# Patient Record
Sex: Male | Born: 1950 | Race: White | Hispanic: No | Marital: Married | Smoking: Never smoker
Health system: Southern US, Community
[De-identification: ages and names within clinical notes are randomized; demographics above are authoritative.]

## PROBLEM LIST (undated history)

## (undated) DIAGNOSIS — Z9889 Other specified postprocedural states: Secondary | ICD-10-CM

## (undated) DIAGNOSIS — K209 Esophagitis, unspecified without bleeding: Secondary | ICD-10-CM

## (undated) DIAGNOSIS — R112 Nausea with vomiting, unspecified: Secondary | ICD-10-CM

## (undated) DIAGNOSIS — R7303 Prediabetes: Secondary | ICD-10-CM

## (undated) DIAGNOSIS — C61 Malignant neoplasm of prostate: Secondary | ICD-10-CM

## (undated) DIAGNOSIS — M549 Dorsalgia, unspecified: Secondary | ICD-10-CM

## (undated) DIAGNOSIS — M4640 Discitis, unspecified, site unspecified: Secondary | ICD-10-CM

## (undated) DIAGNOSIS — T8859XA Other complications of anesthesia, initial encounter: Secondary | ICD-10-CM

## (undated) DIAGNOSIS — I10 Essential (primary) hypertension: Secondary | ICD-10-CM

## (undated) DIAGNOSIS — G4730 Sleep apnea, unspecified: Secondary | ICD-10-CM

## (undated) DIAGNOSIS — G8929 Other chronic pain: Secondary | ICD-10-CM

## (undated) DIAGNOSIS — M4620 Osteomyelitis of vertebra, site unspecified: Secondary | ICD-10-CM

## (undated) DIAGNOSIS — K2980 Duodenitis without bleeding: Secondary | ICD-10-CM

## (undated) DIAGNOSIS — T4145XA Adverse effect of unspecified anesthetic, initial encounter: Secondary | ICD-10-CM

## (undated) DIAGNOSIS — E785 Hyperlipidemia, unspecified: Secondary | ICD-10-CM

## (undated) HISTORY — DX: Esophagitis, unspecified without bleeding: K20.90

## (undated) HISTORY — PX: PROSTATE BIOPSY: SHX241

## (undated) HISTORY — DX: Duodenitis without bleeding: K29.80

## (undated) HISTORY — DX: Sleep apnea, unspecified: G47.30

## (undated) HISTORY — PX: VASECTOMY: SHX75

## (undated) HISTORY — DX: Hyperlipidemia, unspecified: E78.5

## (undated) HISTORY — DX: Essential (primary) hypertension: I10

## (undated) HISTORY — PX: BACK SURGERY: SHX140

## (undated) HISTORY — DX: Prediabetes: R73.03

## (undated) HISTORY — PX: OTHER SURGICAL HISTORY: SHX169

## (undated) HISTORY — PX: CARDIAC CATHETERIZATION: SHX172

---

## 2005-11-29 ENCOUNTER — Ambulatory Visit: Payer: Self-pay | Admitting: Pulmonary Disease

## 2006-02-14 ENCOUNTER — Encounter: Admission: RE | Admit: 2006-02-14 | Discharge: 2006-02-14 | Payer: Self-pay | Admitting: Family Medicine

## 2006-05-30 ENCOUNTER — Ambulatory Visit: Payer: Self-pay | Admitting: Pulmonary Disease

## 2006-09-18 ENCOUNTER — Encounter: Admission: RE | Admit: 2006-09-18 | Discharge: 2006-09-18 | Payer: Self-pay | Admitting: Family Medicine

## 2010-05-18 NOTE — Assessment & Plan Note (Signed)
[facility]                             PULMONARY OFFICE NOTE   NAME:Brandt, John DELUCIA                  MRN:          [id number]  DATE:05/30/2006                            DOB:          11-15-1950    I saw John Brandt in followup today for his severe obstructive sleep  apnea.   Again, he is currently on CPAP at 12 cm of water.  He says that he is  using his machine on a nightly basis.  He will go to bed at around 10:00-  11:00 at night.  He has no trouble falling asleep and he will sleep  through the night until about 4:00 in the morning and then wake up.  He  is not sure exactly why he wakes up but has a great deal of difficulty  falling back to sleep again.  He gets out of bed at 6:30.  He says as a  result he still feels quite tired during the day and has actually found  himself needing to take naps at times during the day.  He has not had  any other significant changes in his health or his medication since his  last visit.   ON PHYSICAL EXAM:  VITAL SIGNS:  Weight 288 pounds.  Temperature 98.2.  Blood pressure 122/82.  Heart rate 50.  Oxygen saturation 95% on room  air.  HEENT:  There is no sinus tenderness.  No oral lesions.  NECK:  No lymphadenopathy.  HEART:  Normal S1, S2.  CHEST:  There is no wheezing.  ABDOMEN:  Obese, soft, nontender.  EXTREMITIES:  No edema.   IMPRESSION:  Severe obstructive sleep apnea.  I will have him undergo an  auto CPAP titration study for 2 weeks to determine if he would need to  have any adjustment in his pressure setting.  The concern that I have is  that he may actually be having more significant apneic events during his  REM sleep which would correlate with the timing of his sleep disruption.  Additionally I have also re-emphasized to him the importance of diet,  exercise, and weight reduction.  I will follow up with him in  approximately 3 months but I will call him with the results of his  titration  study.     Coralyn Helling, MD  Electronically Signed    VS/MedQ  DD: 05/30/2006  DT: 05/30/2006  Job #: [id number]   cc:   Duncan Dull, M.D.

## 2010-05-21 NOTE — Assessment & Plan Note (Signed)
[facility]                             PULMONARY OFFICE NOTE   NAME:Cloke, ORVA GWALTNEY                  MRN:          [id number]  DATE:12/01/2005                            DOB:          03/08/1950    I had received a copy of Mr. Bentz's overnight polysomnogram.   This was done on July 02, 2002, at [facility] in  [place], [place], and was read by Dr. Clotilde Dieter, who is a  diplomate of the American Board of Sleep Medicine.   Upon review of this it showed that he had severe obstructive sleep apnea  with an apnea/hypopnea index of 109 and an oxygen saturation nadir of  73%.  He was then titrated to a CPAP pressure setting of 12 cmH2O with a  reduction in his apnea/hypopnea index to 2.9.     Coralyn Helling, MD  Electronically Signed    VS/MedQ  DD: 11/30/2005  DT: 12/01/2005  Job #: [id number]

## 2010-05-21 NOTE — Assessment & Plan Note (Signed)
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[employer].A  He had undergone an  overnight polysomnogram approximately 2-3 years ago in [place] which  from his description sounds like it was a split-night study and he was  started on CPAP after that.A  He believes he is on CPAP at 12,  unfortunately I do not have the copy of his sleep study for my review at  this time.A  He says he has had no difficulty using his CPAP machine.A  He is currently using a nasal mask and he does not use a heated  humidifier.A  He says that since using his CPAP machine, he has noticed  his sleep has gotten much better and his energy level has improved  considerably.A  His current sleep pattern is that he will go to bed at  around 11 o'clock at night.A  He falls asleep reasonably quickly and he  will sleep through until about 3 or 4 o'clock in the morning, at which  time he wakes up.A  He is not exactly sure why he wakes up, but then he  is able to fall back asleep usually without difficulty.A  This  apparently has only been a problem since he has moved to [place].A  He will wake up at 7 in the morning feeling fairly well rested and  refreshed.A  He says that as the day goes on, however, he will start to  feel droopy at around 4 o'clock in the afternoon, but he has had this  for the last 2-3 years.A  But again this is significantly better than  how he used to be before he was started on CPAP therapy.A  He no longer  snores while he is using his CPAP machine and his wife  says he seems  like he sleeps much more deeply now.A  He will occasionally use a  Tylenol PM if he goes out of town to help him with his sleep and he will  occasionally drink an ice tea during the day.A  Otherwise he has no  history of sleep walking or sleep talking.A  He says he has more vivid  dreams now, but denies any nightmares.A  There is no history of  bruxism.A  He denies any history of restless leg syndrome and there is  no history to suggest sleep hallucination, sleep paralysis, or  cataplexy.   PAST MEDICAL HISTORY:  Otherwise is significant for hypertension,  elevated cholesterol, allergies, and sleep apnea.A  He had also  undergone a cardiac catheterization several years ago which apparently  was unremarkable.A   CURRENT MEDICATIONS:  1.A  Fluticasone nasal spray. 2.A  Klor-Con 20 mEq  t.i.d. 3.A  Lipitor 10 mg daily. 4.A  Metoprolol 100 mg daily. 5.A  Benicar hydrochlorothiazide 40/25 one daily. 6.A  Amlodipine 10 mg  daily. 7.A  Men's multivitamin.   ALLERGIES:  PENICILLIN which causes him to get sweaty and IODINE which  he says causes heaving and shortness of breath.   SOCIAL HISTORY:  He is married.A  He has two children.A  There is no  history of tobacco use.A  He has only occasional alcohol use.A  Again,  he is working as a Hotel manager for [employer].   FAMILY HISTORY:  Significant for his father who had heart disease and  lung cancer.A  His mother had breast cancer.   REVIEW OF SYSTEMS:  Essentially negative except for as stated above.A  He used to be under the care of Dr. Bevely Palmer at [facility]  in [place], [place].A  He does complain of leg swelling in his  left leg greater than his right which improves when he has his legs  elevated.   PHYSICAL EXAMINATION:  VITAL SIGNS:A  He is 6 feet 1 inch tall, 284  pounds, temperature 98.3, blood pressure 118/88, heart rate 57, oxygen  saturation 96% on room air. HEENT:A  Pupils are reactive.A   There is no  sinus tenderness and no nasal discharge.A  He has a Mallampati IV airway  with an elongated uvula and an enlarged tongue.A  There is no  lymphadenopathy and no thyromegaly. HEART:A  S1 and S2 regular rate and  rhythm. CHEST:A  Clear to auscultation.A  ABDOMEN:A  Obese, soft, and  nontender. EXTREMITIES:A  He has minimal ankle edema. NEUROLOGY:A  There  is no focal deficits appreciated.   IMPRESSION:  History of obstructive sleep apnea, currently on CPAP  therapy.A  I have asked him to forward a copy of his sleep study results  for my review and in the meantime, we will make arrangements for him to  be established with the home care company in the [place] area.A  I  have encouraged him to maintain his compliance with the CPAP therapy.A  I have also discussed with him the importance of diet, exercise, and  weight reduction as the ultimate goal for treating his multiple health  problems.A   FOLLOWUP:  In 6 months.A     Coralyn Helling, MD  Electronically Signed    VS/MedQ  DD: 11/29/2005  DT: 11/30/2005  Job #: [id number]   cc:   Duncan Dull, M.D.

## 2010-05-21 NOTE — Assessment & Plan Note (Signed)
[facility]                             PULMONARY OFFICE NOTE   NAME:John Brandt, John Brandt                  MRN:          [id number]  DATE:07/11/2006                            DOB:          1950/03/01    I reviewed the CPAP download for Mr. Posadas from [facility],  and this shows that his overall hypopnea index is 1.9 with a 95th  percentile pressure setting of 11 with a maximal pressure setting of 12.  Interestingly though, is that he appears to be going to bed at about 9  o'clock at night, and waking up at 4 in the morning, which would give  him about 7 hours of sleep a night, which I suspect the reason why he is  waking up at 4 because he actually getting an adequate number of hours  of sleep.  What I have advised for him to do is to delay his sleep time  for 2 to 3 hours.  That way he can wake up at a more reasonable time,  but I do not think anything additional needs to be done with his CPAP,  as it appears he is doing quite well on his current settings.     Coralyn Helling, MD  Electronically Signed    VS/MedQ  DD: 07/11/2006  DT: 07/11/2006  Job #: [id number]   cc:   Duncan Dull, M.D.

## 2011-03-08 ENCOUNTER — Encounter: Payer: Self-pay | Admitting: Pulmonary Disease

## 2011-03-08 ENCOUNTER — Ambulatory Visit (INDEPENDENT_AMBULATORY_CARE_PROVIDER_SITE_OTHER): Payer: BC Managed Care – PPO | Admitting: Pulmonary Disease

## 2011-03-08 DIAGNOSIS — G4700 Insomnia, unspecified: Secondary | ICD-10-CM | POA: Insufficient documentation

## 2011-03-08 DIAGNOSIS — G4733 Obstructive sleep apnea (adult) (pediatric): Secondary | ICD-10-CM | POA: Insufficient documentation

## 2011-03-08 NOTE — Progress Notes (Signed)
Chief Complaint  Patient presents with  . Sleep consult    Last seen in 2008.Marland KitchenMarland Kitchenpt here for follow-up on CPAP use...epworth score is 3.    History of Present Illness: John Brandt is a 61 y.o. male with sleep apnea and insomnia.  I last saw him in 2008.    He has been doing okay with his CPAP.  He needs a new machine, and was advised to have sleep follow up.  He goes to bed at 11 pm.  He can fall asleep quickly, but sometimes has to use ambien.  He has a lot of stress from work.  He wakes up around 3 am, and then has trouble falling back to sleep.  He gets out of bed at 7 am.  He does okay during the day, but thinks he should be getting more sleep than he is.  He has gained about 10 lbs over the past year.  He has not been told he snores when using his CPAP.  He has a nasal mask, and this fits well.  He has been getting his supplies through a mail order, but wants to get set up with a local DME.  Past Medical History  Diagnosis Date  . Hypertension   . Hyperlipidemia   . Sleep apnea   . Allergic rhinitis     Past Surgical History  Procedure Date  . Cardiac catheterization     Allergies  Allergen Reactions  . Iodine   . Penicillins     Physical Exam:  Blood pressure 130/86, pulse 55, temperature 98 F (36.7 C), temperature source Oral, height 6\' 1"  (1.854 m), weight 312 lb 6.4 oz (141.704 kg), SpO2 93.00%. Body mass index is 41.22 kg/(m^2). Wt Readings from Last 2 Encounters:  03/08/11 312 lb 6.4 oz (141.704 kg)    General - Obese  HEENT - no sinus tenderness, no oral exudate, MP 4, no LAN Cardiac - s1s2 regular, no murmur Chest - no wheeze, rales Abdomen - soft, nontender Extremities - no edema Skin - no rashes Neurologic - normal strength Psychiatric - normal mood, behavior   Assessment/Plan:  Outpatient Encounter Prescriptions as of 03/08/2011  Medication Sig Dispense Refill  . amLODipine (NORVASC) 10 MG tablet 1 by mouth daily      . aspirin 81 MG  tablet Take 81 mg by mouth daily.      Marland Kitchen atorvastatin (LIPITOR) 10 MG tablet 1 by mouth daily      . cloNIDine (CATAPRES) 0.2 MG tablet 1 by mouth three times daily      . KLOR-CON M20 20 MEQ tablet 2 tablets three times dailt      . metoprolol succinate (TOPROL-XL) 50 MG 24 hr tablet 1 by mouth daily      . Multiple Vitamin (MULTIVITAMIN) tablet Take 1 tablet by mouth daily.      Marland Kitchen spironolactone (ALDACTONE) 25 MG tablet 1 by mouth daily      . triamcinolone (NASACORT) 55 MCG/ACT nasal inhaler 2 sprays in each nostril daily      . Vitamin D, Ergocalciferol, (DRISDOL) 50000 UNITS CAPS 1 tablet weekly      . zolpidem (AMBIEN) 10 MG tablet 1 by mouth at bedtime as needed        Tiyah Zelenak Pager:  [phone] 03/08/2011, 4:39 PM

## 2011-03-08 NOTE — Patient Instructions (Signed)
Will arrange for new CPAP machine Will call with results of CPAP report Follow up in 3 to 4 months

## 2011-03-08 NOTE — Assessment & Plan Note (Signed)
Discussed stimulus control, sleep restriction, relaxation techniques.  Also discussed realistic expectations from his sleep.

## 2011-03-08 NOTE — Progress Notes (Deleted)
  Subjective:    Patient ID: John Brandt, male    DOB: Aug 19, 1950, 61 y.o.   MRN: [id number]  HPI    Review of Systems  Constitutional:       Gained approx 10 lbs  HENT: Negative.   Eyes: Negative.   Respiratory: Positive for shortness of breath.   Cardiovascular: Positive for leg swelling.  Gastrointestinal: Negative.   Genitourinary: Negative.   Musculoskeletal: Positive for arthralgias.  Skin: Negative.   Neurological: Negative.   Hematological: Negative.   Psychiatric/Behavioral: Negative.        Objective:   Physical Exam        Assessment & Plan:

## 2011-03-08 NOTE — Assessment & Plan Note (Signed)
Will need to assess CPAP setting.  Will try to arrange for auto CPAP with new machine.  Advised he may need repeat sleep study to arrange for this.

## 2011-04-13 ENCOUNTER — Telehealth: Payer: Self-pay | Admitting: Pulmonary Disease

## 2011-04-13 DIAGNOSIS — G4733 Obstructive sleep apnea (adult) (pediatric): Secondary | ICD-10-CM

## 2011-04-13 NOTE — Telephone Encounter (Signed)
Auto CPAP 03/16/11 to 03/29/11>>Used on 15 of 16 nights with average 8 hrs 58 min.  Average AHI 2 with median CPAP 8 cm H2O and 95th percentile CPAP 10 cm H2O.  Will have my nurse inform patient that CPAP report looked very good.  Will set CPAP at 10 cm H2O.

## 2011-04-13 NOTE — Telephone Encounter (Signed)
lmomtcb x1 

## 2011-04-14 NOTE — Telephone Encounter (Signed)
lmomtcb x 2  

## 2011-04-15 NOTE — Telephone Encounter (Signed)
lmomtcb x 3  

## 2011-04-15 NOTE — Telephone Encounter (Signed)
Returning call can be reached at [phone].John Brandt

## 2011-04-15 NOTE — Telephone Encounter (Signed)
I spoke with patient about results and he verbalized understanding and had no questions 

## 2011-06-14 ENCOUNTER — Telehealth: Payer: Self-pay | Admitting: Pulmonary Disease

## 2011-06-14 NOTE — Telephone Encounter (Signed)
Pt returned triage's call & can be reached at [phone] for the rest of the day.  John Brandt

## 2011-06-14 NOTE — Telephone Encounter (Signed)
lmomtcb x1 for pt 

## 2011-06-14 NOTE — Telephone Encounter (Signed)
I spoke with the pt and he has a resmed machine so I advised him to bring the data card.He states he does not know what htat is so I advised to bring the machine.Carron Curie, CMA

## 2011-06-24 ENCOUNTER — Ambulatory Visit
Admission: RE | Admit: 2011-06-24 | Discharge: 2011-06-24 | Disposition: A | Discharge status: Home / Self Care | Payer: BC Managed Care – PPO | Source: Ambulatory Visit | Attending: Family Medicine | Admitting: Family Medicine

## 2011-06-24 ENCOUNTER — Other Ambulatory Visit: Payer: Self-pay | Admitting: Family Medicine

## 2011-06-24 DIAGNOSIS — M549 Dorsalgia, unspecified: Secondary | ICD-10-CM

## 2011-06-27 ENCOUNTER — Ambulatory Visit
Admission: RE | Admit: 2011-06-27 | Discharge: 2011-06-27 | Disposition: A | Discharge status: Home / Self Care | Payer: BC Managed Care – PPO | Source: Ambulatory Visit | Attending: Family Medicine | Admitting: Family Medicine

## 2011-06-27 ENCOUNTER — Other Ambulatory Visit: Payer: Self-pay | Admitting: Family Medicine

## 2011-06-27 DIAGNOSIS — M549 Dorsalgia, unspecified: Secondary | ICD-10-CM

## 2011-06-27 MED ORDER — GADOBENATE DIMEGLUMINE 529 MG/ML IV SOLN
20.0000 mL | Freq: Once | INTRAVENOUS | Status: AC | PRN
  Administered 2011-06-27: 20 mL via INTRAVENOUS

## 2011-06-28 ENCOUNTER — Other Ambulatory Visit: Payer: Self-pay | Admitting: Family Medicine

## 2011-06-29 ENCOUNTER — Other Ambulatory Visit: Payer: Self-pay | Admitting: Family Medicine

## 2011-06-29 ENCOUNTER — Ambulatory Visit
Admission: RE | Admit: 2011-06-29 | Discharge: 2011-06-29 | Disposition: A | Discharge status: Home / Self Care | Payer: BC Managed Care – PPO | Source: Ambulatory Visit | Attending: Family Medicine | Admitting: Family Medicine

## 2011-06-29 ENCOUNTER — Other Ambulatory Visit: Payer: BC Managed Care – PPO

## 2011-06-29 DIAGNOSIS — R222 Localized swelling, mass and lump, trunk: Secondary | ICD-10-CM

## 2011-06-30 ENCOUNTER — Encounter (HOSPITAL_COMMUNITY): Payer: Self-pay | Admitting: Emergency Medicine

## 2011-06-30 ENCOUNTER — Inpatient Hospital Stay (HOSPITAL_COMMUNITY)
Admission: EM | Admit: 2011-06-30 | Discharge: 2011-07-02 | DRG: 813 | Disposition: A | Discharge status: Home / Self Care | Payer: BC Managed Care – PPO | Attending: Internal Medicine | Admitting: Internal Medicine

## 2011-06-30 ENCOUNTER — Emergency Department (HOSPITAL_COMMUNITY): Payer: BC Managed Care – PPO

## 2011-06-30 DIAGNOSIS — D72829 Elevated white blood cell count, unspecified: Secondary | ICD-10-CM

## 2011-06-30 DIAGNOSIS — E785 Hyperlipidemia, unspecified: Secondary | ICD-10-CM | POA: Diagnosis present

## 2011-06-30 DIAGNOSIS — M545 Low back pain: Secondary | ICD-10-CM

## 2011-06-30 DIAGNOSIS — Z79899 Other long term (current) drug therapy: Secondary | ICD-10-CM

## 2011-06-30 DIAGNOSIS — G4700 Insomnia, unspecified: Secondary | ICD-10-CM

## 2011-06-30 DIAGNOSIS — K7689 Other specified diseases of liver: Secondary | ICD-10-CM | POA: Diagnosis present

## 2011-06-30 DIAGNOSIS — R1110 Vomiting, unspecified: Secondary | ICD-10-CM

## 2011-06-30 DIAGNOSIS — K760 Fatty (change of) liver, not elsewhere classified: Secondary | ICD-10-CM

## 2011-06-30 DIAGNOSIS — R112 Nausea with vomiting, unspecified: Secondary | ICD-10-CM

## 2011-06-30 DIAGNOSIS — I10 Essential (primary) hypertension: Secondary | ICD-10-CM | POA: Diagnosis present

## 2011-06-30 DIAGNOSIS — E876 Hypokalemia: Secondary | ICD-10-CM

## 2011-06-30 DIAGNOSIS — G4733 Obstructive sleep apnea (adult) (pediatric): Secondary | ICD-10-CM

## 2011-06-30 DIAGNOSIS — A088 Other specified intestinal infections: Principal | ICD-10-CM | POA: Diagnosis present

## 2011-06-30 DIAGNOSIS — D7289 Other specified disorders of white blood cells: Secondary | ICD-10-CM

## 2011-06-30 HISTORY — DX: Other specified postprocedural states: Z98.890

## 2011-06-30 LAB — CBC WITH DIFFERENTIAL/PLATELET
HCT: 48.1 % (ref 39.0–52.0)
Hemoglobin: 17.1 g/dL — ABNORMAL HIGH (ref 13.0–17.0)
Lymphocytes Relative: 10 % — ABNORMAL LOW (ref 12–46)
Lymphs Abs: 1.7 10*3/uL (ref 0.7–4.0)
Monocytes Absolute: 1.5 10*3/uL — ABNORMAL HIGH (ref 0.1–1.0)
Monocytes Relative: 9 % (ref 3–12)
Neutrophils Relative %: 81 % — ABNORMAL HIGH (ref 43–77)
RDW: 13.5 % (ref 11.5–15.5)
WBC: 17.3 10*3/uL — ABNORMAL HIGH (ref 4.0–10.5)

## 2011-06-30 LAB — COMPREHENSIVE METABOLIC PANEL
Alkaline Phosphatase: 118 U/L — ABNORMAL HIGH (ref 39–117)
CO2: 20 mEq/L (ref 19–32)
Calcium: 10.2 mg/dL (ref 8.4–10.5)
Chloride: 100 mEq/L (ref 96–112)
GFR calc Af Amer: >90 mL/min (ref >90)
GFR calc non Af Amer: 88 mL/min — ABNORMAL LOW (ref >90)
Potassium: 2.9 mEq/L — ABNORMAL LOW (ref 3.5–5.1)
Sodium: 139 mEq/L (ref 135–145)

## 2011-06-30 LAB — URINALYSIS, ROUTINE W REFLEX MICROSCOPIC
Specific Gravity, Urine: 1.033 — ABNORMAL HIGH (ref 1.005–1.030)
Urobilinogen, UA: 1 mg/dL (ref 0.0–1.0)

## 2011-06-30 LAB — LACTIC ACID, PLASMA: Lactic Acid, Venous: 2.3 mmol/L — ABNORMAL HIGH (ref 0.5–2.2)

## 2011-06-30 LAB — MAGNESIUM: Magnesium: 2.2 mg/dL (ref 1.5–2.5)

## 2011-06-30 LAB — CARDIAC PANEL(CRET KIN+CKTOT+MB+TROPI)
CK, MB: 4.8 ng/mL — ABNORMAL HIGH (ref 0.3–4.0)
Troponin I: <0.3 ng/mL (ref <0.30)

## 2011-06-30 LAB — LIPASE, BLOOD: Lipase: 37 U/L (ref 11–59)

## 2011-06-30 LAB — URINE MICROSCOPIC-ADD ON

## 2011-06-30 MED ORDER — CLONIDINE HCL 0.2 MG PO TABS
0.2000 mg | ORAL_TABLET | Freq: Three times a day (TID) | ORAL | Status: DC
  Administered 2011-06-30 – 2011-07-02 (×6): 0.2 mg via ORAL
  Filled 2011-06-30 (×8): qty 1

## 2011-06-30 MED ORDER — METOPROLOL SUCCINATE ER 50 MG PO TB24
50.0000 mg | ORAL_TABLET | Freq: Every day | ORAL | Status: DC
  Administered 2011-06-30 – 2011-07-01 (×2): 50 mg via ORAL
  Filled 2011-06-30 (×3): qty 1

## 2011-06-30 MED ORDER — ONDANSETRON HCL 4 MG/2ML IJ SOLN
INTRAMUSCULAR | Status: AC
  Administered 2011-06-30: 4 mg via INTRAVENOUS
  Filled 2011-06-30: qty 2

## 2011-06-30 MED ORDER — FLUTICASONE PROPIONATE 50 MCG/ACT NA SUSP
1.0000 | Freq: Every day | NASAL | Status: DC
  Administered 2011-06-30 – 2011-07-02 (×3): 1 via NASAL
  Filled 2011-06-30: qty 16

## 2011-06-30 MED ORDER — SODIUM CHLORIDE 0.9 % IV SOLN: INTRAVENOUS | Status: DC

## 2011-06-30 MED ORDER — POTASSIUM CHLORIDE 10 MEQ/100ML IV SOLN
10.0000 meq | INTRAVENOUS | Status: AC
  Administered 2011-06-30 (×2): 10 meq via INTRAVENOUS
  Filled 2011-06-30 (×2): qty 100

## 2011-06-30 MED ORDER — ONDANSETRON HCL 4 MG/2ML IJ SOLN: 4.0000 mg | Freq: Once | INTRAMUSCULAR | Status: DC

## 2011-06-30 MED ORDER — ASPIRIN EC 81 MG PO TBEC
81.0000 mg | DELAYED_RELEASE_TABLET | Freq: Every day | ORAL | Status: DC
  Administered 2011-06-30 – 2011-07-02 (×3): 81 mg via ORAL
  Filled 2011-06-30 (×3): qty 1

## 2011-06-30 MED ORDER — SODIUM CHLORIDE 0.9 % IV BOLUS (SEPSIS)
1000.0000 mL | Freq: Once | INTRAVENOUS | Status: AC
  Administered 2011-06-30: 1000 mL via INTRAVENOUS

## 2011-06-30 MED ORDER — ONDANSETRON HCL 4 MG PO TABS: 4.0000 mg | ORAL_TABLET | Freq: Four times a day (QID) | ORAL | Status: DC | PRN

## 2011-06-30 MED ORDER — SPIRONOLACTONE 25 MG PO TABS
25.0000 mg | ORAL_TABLET | Freq: Every day | ORAL | Status: DC
  Administered 2011-06-30 – 2011-07-02 (×3): 25 mg via ORAL
  Filled 2011-06-30 (×3): qty 1

## 2011-06-30 MED ORDER — ADULT MULTIVITAMIN W/MINERALS CH
1.0000 | ORAL_TABLET | Freq: Every day | ORAL | Status: DC
  Administered 2011-06-30 – 2011-07-02 (×3): 1 via ORAL
  Filled 2011-06-30 (×3): qty 1

## 2011-06-30 MED ORDER — AMLODIPINE BESYLATE 10 MG PO TABS
10.0000 mg | ORAL_TABLET | Freq: Every day | ORAL | Status: DC
  Administered 2011-06-30 – 2011-07-02 (×3): 10 mg via ORAL
  Filled 2011-06-30 (×3): qty 1

## 2011-06-30 MED ORDER — SODIUM CHLORIDE 0.9 % IJ SOLN
3.0000 mL | Freq: Two times a day (BID) | INTRAMUSCULAR | Status: DC
  Administered 2011-07-01 – 2011-07-02 (×2): 3 mL via INTRAVENOUS

## 2011-06-30 MED ORDER — ENOXAPARIN SODIUM 60 MG/0.6ML ~~LOC~~ SOLN
60.0000 mg | SUBCUTANEOUS | Status: DC
  Administered 2011-06-30 – 2011-07-01 (×2): 60 mg via SUBCUTANEOUS
  Filled 2011-06-30 (×3): qty 0.6

## 2011-06-30 MED ORDER — POTASSIUM CHLORIDE CRYS ER 20 MEQ PO TBCR
40.0000 meq | EXTENDED_RELEASE_TABLET | Freq: Three times a day (TID) | ORAL | Status: DC
  Administered 2011-06-30: 40 meq via ORAL
  Filled 2011-06-30 (×5): qty 2

## 2011-06-30 MED ORDER — ATORVASTATIN CALCIUM 10 MG PO TABS
10.0000 mg | ORAL_TABLET | Freq: Every day | ORAL | Status: DC
  Administered 2011-07-01 – 2011-07-02 (×2): 10 mg via ORAL
  Filled 2011-06-30 (×3): qty 1

## 2011-06-30 MED ORDER — ONDANSETRON HCL 4 MG/2ML IJ SOLN
4.0000 mg | INTRAMUSCULAR | Status: DC | PRN
  Administered 2011-06-30 – 2011-07-01 (×3): 4 mg via INTRAVENOUS
  Filled 2011-06-30 (×3): qty 2

## 2011-06-30 MED ORDER — SODIUM CHLORIDE 0.9 % IV SOLN
INTRAVENOUS | Status: DC
  Administered 2011-06-30: 75 mL/h via INTRAVENOUS
  Administered 2011-07-01: 06:00:00 via INTRAVENOUS

## 2011-06-30 MED ORDER — ONDANSETRON HCL 4 MG/2ML IJ SOLN
4.0000 mg | Freq: Once | INTRAMUSCULAR | Status: AC
  Administered 2011-06-30 (×2): 4 mg via INTRAVENOUS
  Filled 2011-06-30: qty 2

## 2011-06-30 MED ORDER — ONDANSETRON HCL 4 MG/2ML IJ SOLN
4.0000 mg | Freq: Four times a day (QID) | INTRAMUSCULAR | Status: DC | PRN
  Administered 2011-06-30: 4 mg via INTRAVENOUS
  Filled 2011-06-30: qty 2

## 2011-06-30 MED ORDER — ONDANSETRON HCL 4 MG PO TABS: 4.0000 mg | ORAL_TABLET | ORAL | Status: DC | PRN

## 2011-06-30 MED ORDER — ENOXAPARIN SODIUM 40 MG/0.4ML ~~LOC~~ SOLN: 40.0000 mg | SUBCUTANEOUS | Status: DC

## 2011-06-30 NOTE — Progress Notes (Signed)
ANTICOAGULATION CONSULT NOTE - Lovenox Adjustment  Pharmacy Consult for Lovenox Adjustment Indication: DVT Prophylaxis  Allergies  Allergen Reactions  . Iodine Rash  . Penicillins Rash    Patient Measurements: Height: 6\' 1"  (185.4 cm) Weight: 281 lb 9.6 oz (127.733 kg) IBW/kg (Calculated) : 79.9    Vital Signs: Temp: 98.2 F (36.8 C) (06/27 1551) Temp src: Oral (06/27 1551) BP: 155/97 mmHg (06/27 1551) Pulse Rate: 100  (06/27 1507)  Labs:  Basename 06/30/11 1145  HGB 17.1*  HCT 48.1  PLT 369  APTT --  LABPROT --  INR --  HEPARINUNFRC --  CREATININE 0.94  CKTOTAL 351*  CKMB 4.8*  TROPONINI <0.30    Estimated Creatinine Clearance: 114.1 ml/min (by C-G formula based on Cr of 0.94).   Medical History: Past Medical History  Diagnosis Date  . Hypertension   . Hyperlipidemia   . Sleep apnea   . Allergic rhinitis   . Hx of cardiac cath     Medications:  Scheduled:    . amLODipine  10 mg Oral Daily  . aspirin EC  81 mg Oral Daily  . atorvastatin  10 mg Oral q1800  . cloNIDine  0.2 mg Oral TID  . enoxaparin  60 mg Subcutaneous Q24H  . fluticasone  1 spray Each Nare Daily  . metoprolol succinate  50 mg Oral Daily  . multivitamin with minerals  1 tablet Oral Daily  . ondansetron      . ondansetron  4 mg Intravenous Once  . potassium chloride  10 mEq Intravenous Q1 Hr x 4  . potassium chloride SA  40 mEq Oral TID  . sodium chloride  1,000 mL Intravenous Once  . sodium chloride  3 mL Intravenous Q12H  . spironolactone  25 mg Oral Daily  . DISCONTD: sodium chloride   Intravenous STAT  . DISCONTD: enoxaparin  40 mg Subcutaneous Q24H  . DISCONTD: ondansetron (ZOFRAN) IV  4 mg Intravenous Once    Assessment: 60 YOM, BMI 37. Lovenox 40mg  sq daily ordered, pharmacy may adjust. CBC reviewed, H/H and pltc wnl.  Goal of Therapy:  VTE prophylaxis   Plan:  Will adjust dose per protocol to 0.5mg /kg q24h given normal renal fx and BMI >30. Will sign off,  reconsult if necessary.  Gwen Her PharmD  [phone] 06/30/2011 4:16 PM

## 2011-06-30 NOTE — ED Provider Notes (Signed)
History     CSN: [id number]  Arrival date & time 06/30/11  1045   First MD Initiated Contact with Patient 06/30/11 1056      Chief Complaint  Patient presents with  . Shortness of Breath  . Emesis    (Consider location/radiation/quality/duration/timing/severity/associated sxs/prior treatment) HPI Comments: Pt has been having lower back pain for last month.  Recently had MRI of lumbar spine ordered by PMD at [facility].  Over last week, started having nausea, decreased appetitie.  Started vomiting 3 days ago.  Unable to keep anything down since then.  No abd pain.  Pain is aching across lower back.  No radiation down legs.  No numbness or weakness to legs.  No loss of bowel/bladder control.  +normal BM yesterday.  No diarrhea,  No fevers.  No blood in stool or black stools.  No urinary symtpoms.  No cough or chest congestion.  No chest pain or tightness. Started having SOB yesterday, mostly on exertion.  Patient is a 61 y.o. male presenting with vomiting. The history is provided by the patient.  Emesis  This is a new problem. The current episode started more than 2 days ago. The problem occurs 5 to 10 times per day. The problem has been gradually worsening. The emesis has an appearance of stomach contents (had some dark brown material yesterday, none today). There has been no fever. Pertinent negatives include no abdominal pain, no arthralgias, no chills, no cough, no diarrhea, no fever, no headaches and no URI.    Past Medical History  Diagnosis Date  . Hypertension   . Hyperlipidemia   . Sleep apnea   . Allergic rhinitis   . Hx of cardiac cath     Past Surgical History  Procedure Date  . Cardiac catheterization     Family History  Problem Relation Age of Onset  . Allergies Mother   . Heart disease Mother   . Lung cancer Mother     mesothelioma  . Aneurysm Brother     brain  . Heart disease Father   . Lung cancer Father     History  Substance Use Topics  .  Smoking status: Never Smoker   . Smokeless tobacco: Never Used  . Alcohol Use: Yes     rare use      Review of Systems  Constitutional: Positive for fatigue. Negative for fever, chills and diaphoresis.  HENT: Negative for congestion, rhinorrhea and sneezing.   Eyes: Negative.   Respiratory: Positive for shortness of breath. Negative for cough and chest tightness.   Cardiovascular: Negative for chest pain and leg swelling.  Gastrointestinal: Positive for nausea and vomiting. Negative for abdominal pain, diarrhea and blood in stool.  Genitourinary: Negative for frequency, hematuria, flank pain and difficulty urinating.  Musculoskeletal: Positive for back pain. Negative for arthralgias.  Skin: Negative for rash.  Neurological: Negative for dizziness, speech difficulty, weakness, numbness and headaches.    Allergies  Iodine and Penicillins  Home Medications   Current Outpatient Rx  Name Route Sig Dispense Refill  . AMLODIPINE BESYLATE 10 MG PO TABS  10 mg. 1 by mouth daily    . ASPIRIN 81 MG PO TABS Oral Take 81 mg by mouth daily.    . ATORVASTATIN CALCIUM 10 MG PO TABS  10 mg. 1 by mouth daily    . CLONIDINE HCL 0.2 MG PO TABS Oral Take 0.6 mg by mouth 3 (three) times daily. 1 by mouth three times daily    .  KLOR-CON M20 20 MEQ PO TBCR Oral Take 40 mEq by mouth 3 (three) times daily. 2 tablets three times dailt    . METOPROLOL SUCCINATE ER 50 MG PO TB24 Oral Take 50 mg by mouth daily. 1 by mouth daily    . ONE-DAILY MULTI VITAMINS PO TABS Oral Take 1 tablet by mouth daily.    Marland Kitchen SPIRONOLACTONE 25 MG PO TABS Oral Take 25 mg by mouth daily. 1 by mouth daily    . TRAMADOL HCL 50 MG PO TABS Oral Take 50 mg by mouth every 8 (eight) hours as needed. pain    . TRIAMCINOLONE ACETONIDE 55 MCG/ACT NA INHA Nasal Place 2 sprays into the nose daily. 2 sprays in each nostril daily    . VITAMIN D (ERGOCALCIFEROL) 50000 UNITS PO CAPS  50,000 Units. 1 tablet weekly      BP 180/100  Pulse 89   Temp 97.8 F (36.6 C) (Oral)  Resp 26  Ht 6\' 1"  (1.854 m)  Wt 298 lb (135.172 kg)  BMI 39.32 kg/m2  SpO2 100%  Physical Exam  Constitutional: He is oriented to person, place, and time. He appears well-developed and well-nourished.  HENT:  Head: Normocephalic and atraumatic.       Dry mucus membranes   Eyes: Pupils are equal, round, and reactive to light.  Neck: Normal range of motion. Neck supple.  Cardiovascular: Normal rate, regular rhythm and normal heart sounds.   Pulmonary/Chest: Effort normal and breath sounds normal. No respiratory distress. He has no wheezes. He has no rales. He exhibits no tenderness.  Abdominal: Soft. Bowel sounds are normal. There is no tenderness. There is no rebound and no guarding.  Musculoskeletal: Normal range of motion. He exhibits edema (at baseline per pt). He exhibits no tenderness.       Pt has mild TTP lower lumbar musculature toward sacrum  Lymphadenopathy:    He has no cervical adenopathy.  Neurological: He is alert and oriented to person, place, and time. He has normal strength. No sensory deficit. GCS eye subscore is 4. GCS verbal subscore is 5. GCS motor subscore is 6.  Skin: Skin is warm and dry. No rash noted.  Psychiatric: He has a normal mood and affect.    ED Course  Procedures (including critical care time)  Results for orders placed during the hospital encounter of 06/30/11  CBC WITH DIFFERENTIAL      Component Value Range   WBC 17.3 (*) 4.0 - 10.5 K/uL   RBC 5.89 (*) 4.22 - 5.81 MIL/uL   Hemoglobin 17.1 (*) 13.0 - 17.0 g/dL   HCT 69.6  29.5 - 28.4 %   MCV 81.7  78.0 - 100.0 fL   MCH 29.0  26.0 - 34.0 pg   MCHC 35.6  30.0 - 36.0 g/dL   RDW 13.2  44.0 - 10.2 %   Platelets 369  150 - 400 K/uL   Neutrophils Relative 81 (*) 43 - 77 %   Neutro Abs 14.1 (*) 1.7 - 7.7 K/uL   Lymphocytes Relative 10 (*) 12 - 46 %   Lymphs Abs 1.7  0.7 - 4.0 K/uL   Monocytes Relative 9  3 - 12 %   Monocytes Absolute 1.5 (*) 0.1 - 1.0 K/uL    Eosinophils Relative 0  0 - 5 %   Eosinophils Absolute 0.0  0.0 - 0.7 K/uL   Basophils Relative 0  0 - 1 %   Basophils Absolute 0.0  0.0 - 0.1 K/uL  COMPREHENSIVE METABOLIC PANEL      Component Value Range   Sodium 139  135 - 145 mEq/L   Potassium 2.9 (*) 3.5 - 5.1 mEq/L   Chloride 100  96 - 112 mEq/L   CO2 20  19 - 32 mEq/L   Glucose, Bld 115 (*) 70 - 99 mg/dL   BUN 23  6 - 23 mg/dL   Creatinine, Ser 0.98  0.50 - 1.35 mg/dL   Calcium 11.9  8.4 - 14.7 mg/dL   Total Protein 8.7 (*) 6.0 - 8.3 g/dL   Albumin 4.2  3.5 - 5.2 g/dL   AST 33  0 - 37 U/L   ALT 39  0 - 53 U/L   Alkaline Phosphatase 118 (*) 39 - 117 U/L   Total Bilirubin 1.1  0.3 - 1.2 mg/dL   GFR calc non Af Amer 88 (*) >90 mL/min   GFR calc Af Amer >90  >90 mL/min  URINALYSIS, ROUTINE W REFLEX MICROSCOPIC      Component Value Range   Color, Urine AMBER (*) YELLOW   APPearance CLOUDY (*) CLEAR   Specific Gravity, Urine 1.033 (*) 1.005 - 1.030   pH 6.0  5.0 - 8.0   Glucose, UA NEGATIVE  NEGATIVE mg/dL   Hgb urine dipstick TRACE (*) NEGATIVE   Bilirubin Urine SMALL (*) NEGATIVE   Ketones, ur >80 (*) NEGATIVE mg/dL   Protein, ur >829 (*) NEGATIVE mg/dL   Urobilinogen, UA 1.0  0.0 - 1.0 mg/dL   Nitrite NEGATIVE  NEGATIVE   Leukocytes, UA SMALL (*) NEGATIVE  LACTIC ACID, PLASMA      Component Value Range   Lactic Acid, Venous 2.3 (*) 0.5 - 2.2 mmol/L  LIPASE, BLOOD      Component Value Range   Lipase 37  11 - 59 U/L  CARDIAC PANEL(CRET KIN+CKTOT+MB+TROPI)      Component Value Range   Total CK 351 (*) 7 - 232 U/L   CK, MB 4.8 (*) 0.3 - 4.0 ng/mL   Troponin I <0.30  <0.30 ng/mL   Relative Index 1.4  0.0 - 2.5  URINE MICROSCOPIC-ADD ON      Component Value Range   Squamous Epithelial / LPF RARE  RARE   WBC, UA 7-10  <3 WBC/hpf   RBC / HPF 0-2  <3 RBC/hpf   Bacteria, UA RARE  RARE   Casts GRANULAR CAST (*) NEGATIVE   Urine-Other MUCOUS PRESENT     Ct Abdomen Pelvis Wo Contrast  06/30/2011  *RADIOLOGY REPORT*   Clinical Data: Back pain, nausea and vomiting.  CT ABDOMEN AND PELVIS WITHOUT CONTRAST  Technique:  Multidetector CT imaging of the abdomen and pelvis was performed following the standard protocol without intravenous contrast.  Comparison: CT of abdomen and pelvis 02/14/2006.  Findings:  Lung Bases: Small calcified granuloma in the lateral segment of the right middle lobe.  Subsegmental atelectasis versus scarring in the inferior segment of the lingula.  Abdomen/Pelvis:  Diffusely decreased attenuation throughout the hepatic parenchyma, compatible with hepatic steatosis.  Some focal fatty sparing is noted adjacent to the gallbladder fossa.  Multiple calcified gallstones are seen within the dependent portion of the gallbladder, without evidence to suggest acute cholecystitis at this time.  Diffuse fatty atrophy of the pancreas.  The appearance of the spleen and bilateral adrenal glands is unremarkable.  There are low-attenuation lesions in the kidneys bilaterally, largest of which is in the interpolar region of the left kidney measuring up to 8.7 cm in  diameter.  These are favored to represent cysts (and are similar to prior study 02/14/2006), but are incompletely characterized on today's noncontrast CT examination.  There are no abnormal calcifications within the collecting system of either kidney, along the course of either ureter, or within the lumen of the urinary bladder to suggest urinary tract calculi.  No signs of hydroureteronephrosis or perinephric stranding to suggest urinary tract obstruction at this time.  There are numerous colonic diverticula, without surrounding inflammatory changes to suggest acute diverticulitis at this time. No ascites or pneumoperitoneum and no pathologic distension of bowel.  No definite pathologic lymphadenopathy identified within the abdomen or pelvis.  Coarse calcifications are noted within the prostate gland, and the prostate gland appears enlarged.  Urinary bladder is  unremarkable.  Musculoskeletal: There are no aggressive appearing lytic or blastic lesions noted in the visualized portions of the skeleton.  IMPRESSION: 1.  No acute findings in the abdomen or pelvis to account for the patient's symptoms.  Specifically, no abnormal urinary tract calculi or findings of urinary tract obstruction. 2.  Cholelithiasis without evidence to suggest acute cholecystitis. 3.  Severe hepatic steatosis. 4.  Low attenuation lesions in the kidneys bilaterally, favored to represent cysts as they are similar to prior examination from 02/14/2006. 5.  Fatty atrophy of the pancreas. 6.  Colonic diverticulosis without evidence to suggest acute diverticulitis. 7.  Prostatomegaly.  Original Report Authenticated By: Florencia Reasons, M.D.   Dg Chest 2 View  06/30/2011  *RADIOLOGY REPORT*  Clinical Data: Shortness of breath, vomiting.  CHEST - 2 VIEW  Comparison: 09/18/2006  Findings: Calcified granuloma in the right midlung.  Mild cardiomegaly.  No acute airspace opacities or effusions.  No acute bony abnormality.  IMPRESSION: Mild cardiomegaly.  Old granulomatous disease.  No active disease.  Original Report Authenticated By: Cyndie Chime, M.D.   Dg Lumbar Spine Complete  06/24/2011  *RADIOLOGY REPORT*  Clinical Data: Left-sided back pain for several months, no injury  LUMBAR SPINE - COMPLETE 4+ VIEW  Comparison: CT abdomen pelvis of 02/14/2006  Findings: There is very mild curvature of the lumbar spine convex to the right.  The lumbar vertebrae otherwise are in normal alignment.  Intervertebral disc spaces appear normal.  No compression deformity is seen.  IMPRESSION: Mild curvature convex to the right.  Normal intervertebral disc spaces.  No acute abnormality.  Original Report Authenticated By: Juline Patch, M.D.   Mr Lumbar Spine W Wo Contrast  06/28/2011  *RADIOLOGY REPORT*  Clinical Data: Back pain.  Palpable mass measuring 8 cm in the lumbar paraspinal area.  MRI LUMBAR SPINE WITHOUT  AND WITH CONTRAST  Technique:  Multiplanar and multiecho pulse sequences of the lumbar spine were obtained without and with intravenous contrast.  Contrast: 20mL MULTIHANCE GADOBENATE DIMEGLUMINE 529 MG/ML IV SOLN  BUN and creatinine were obtained on site at [facility] at [address] Results:  BUN not available mg/dL,  Creatinine 0.8 mg/dL. GFR 98  Comparison: 06/24/2011 radiographs.02/14/2006 abdominal CT.  Findings: On the left side of the T11-T12 disc space, there are inflammatory changes.  There appears to be marrow edema in the T12 vertebra, with post-gadolinium enhancement.  There is paravertebral phlegmon on the left which enhances.  This is only partially visualized due to the lumbar field of view.  The origin of this is unclear and the abnormality is incompletely visualized.  This is not have the typical appearance for spinal infection.  MRI thoracic spine with and without contrast is  recommended for further assessment.  The area of palpable abnormality was marked with a vitamin E capsules, which are identified on additional coronal images.  There is no mass lesion identified.  The paraspinal soft tissues demonstrate too numerous to count bilateral simple renal cysts without enhancement.  The largest is identified on coronal imaging, measuring 83 mm long axis.  The numbering convention used for this exam terms L5-S1 as the last full intervertebral disc space above the sacrum.  Spinal cord terminates posterior to the L1 vertebra.  There is a mild S-shaped curvature of the lumbar spine.  Dextroconvex apex at L4. Levoconvex apex at the thoracolumbar junction.  L1-L2:  Mild disc degeneration.  No stenosis.  L2-L3:  Negative.  L3-L4:  Negative.  L4-L5:  Disc desiccation with mild loss of height.  Small central disc protrusion is present with mild impression on the thecal sac. Mild narrowing of the right lateral recess without neural compression.  Central canal appears adequately patent.   Foramina patent.  Right facet mild right facet arthritis with small facet effusion.  L5-S1:  Disc desiccation.  Vacuum disc.  Small central disc protrusion minimally indenting the anterior thecal sac.  Lateral recesses and foramina appear patent.  IMPRESSION: 1.  Inflammatory changes or mass on the left side of the T12 vertebra extending along the T11-T12 paraspinal soft tissues.  This is incompletely visualized due to lumbar field of view.  Thoracic spine MRI with and without infusion recommended for further assessment. 2.  Mild lumbar spondylosis and S-shaped scoliosis.  No compressive stenosis. 3.  No superficial mass lesion is identified in the left flank/back soft tissues.  These results will be called to the ordering clinician or representative by the Radiologist Assistant, and communication documented in the PACS Dashboard.  Original Report Authenticated By: Andreas Newport, M.D.         Date: 06/30/2011  Rate: 107  Rhythm: sinus tachycardia, probable sinus rhythm  QRS Axis: left  Intervals: normal  ST/T Wave abnormalities: nonspecific ST/T changes  Conduction Disutrbances:none  Narrative Interpretation:   Old EKG Reviewed: none available    1. Vomiting   2. Hypokalemia       MDM  Pt with vomiting, dehydration, hypokalemia.  Ct does not show anything acute.  Consulted [facility] who will admit pt.  He has been given fluids, potassioum replacement        Rolan Bucco, MD 06/30/11 1340

## 2011-06-30 NOTE — ED Notes (Signed)
Per pt, started vomiting on and off 2 days ago, unable to keep food down-short of breath since yesterday

## 2011-06-30 NOTE — ED Notes (Signed)
Transported to radiology, patient reports that his nausea is resolved following the zofran

## 2011-06-30 NOTE — H&P (Signed)
Patient's PCP: GATES,DONNA RUTH, MD  Chief Complaint: N/V  History of Present Illness: John Brandt is a 60 y.o. white male who over the  last week, started having nausea, decreased appetitie. Started vomiting 3 days ago. Unable to keep anything down since then. No abd pain. Pain is aching across lower back- this a chronic issue. No radiation down legs. No numbness or weakness to legs. No loss of bowel/bladder control. +normal BM yesterday. No diarrhea, No fevers. No blood in stool or black stools. No urinary symtpoms. No cough or chest congestion. No chest pain or tightness. Started having SOB yesterday- related to anxiety.  MRI done few days ago for back pain showed a ? Mass next to T12 and recommended MRI- will defer to PCP to get done when feeling better. Unless WBCs do not correct and then will do as inpatient to r/o infection    Meds: Scheduled Meds:   . sodium chloride   Intravenous STAT  . ondansetron      . ondansetron  4 mg Intravenous Once  . ondansetron (ZOFRAN) IV  4 mg Intravenous Once  . potassium chloride  10 mEq Intravenous Q1 Hr x 4  . sodium chloride  1,000 mL Intravenous Once   Continuous Infusions:  PRN Meds:. Allergies: Iodine and Penicillins Past Medical History  Diagnosis Date  . Hypertension   . Hyperlipidemia   . Sleep apnea   . Allergic rhinitis   . Hx of cardiac cath    Past Surgical History  Procedure Date  . Cardiac catheterization    Family History  Problem Relation Age of Onset  . Allergies Mother   . Heart disease Mother   . Lung cancer Mother     mesothelioma  . Aneurysm Brother     brain  . Heart disease Father   . Lung cancer Father    History   Social History  . Marital Status: Married    Spouse Name: N/A    Number of Children: N/A  . 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 Smoking status: Never Smoker   . Smokeless tobacco: Never Used  . Alcohol Use: Yes       rare use  . Drug Use: No  . Sexually Active: Not on file   Other Topics Concern  . Not on file   Social History Narrative  . No narrative on file   Review of Systems: All systems reviewed with the patient and positive as per history of present illness, otherwise all other systems are negative.   Physical Exam: Blood pressure 180/100, pulse 89, temperature 97.8 F (36.6 C), temperature source Oral, resp. rate 26, height 6' 1" (1.854 m), weight 135.172 kg (298 lb), SpO2 100.00%. General: Awake, Oriented x3, No acute distress, obese HEENT: EOMI, dry mucous membranes Neck: Supple CV: S1 and S2, rrr Lungs: Clear to ascultation bilaterally, no wheezing Abdomen: Soft, Nontender, Nondistended, +bowel sounds. Ext: Good pulses. Trace edema. No clubbing or cyanosis noted. Neuro: Cranial Nerves II-XII grossly intact. Has 5/5 motor strength in upper and lower extremities.    Lab results:  Basename 06/30/11 1145  NA 139  K 2.9*  CL 100  CO2 20  GLUCOSE 115*  BUN 23  CREATININE 0.94  CALCIUM 10.2  MG --  PHOS --    Basename 06/30/11 1145  AST 33  ALT 39  ALKPHOS 118*  BILITOT 1.1  PROT 8.7*  ALBUMIN   4.2    Basename 06/30/11 1145  LIPASE 37  AMYLASE --    Basename 06/30/11 1145  WBC 17.3*  NEUTROABS 14.1*  HGB 17.1*  HCT 48.1  MCV 81.7  PLT 369    Basename 06/30/11 1145  CKTOTAL 351*  CKMB 4.8*  CKMBINDEX --  TROPONINI <0.30   No components found with this basename: POCBNP:3 No results found for this basename: DDIMER in the last 72 hours No results found for this basename: HGBA1C:2 in the last 72 hours No results found for this basename: CHOL:2,HDL:2,LDLCALC:2,TRIG:2,CHOLHDL:2,LDLDIRECT:2 in the last 72 hours No results found for this basename: TSH,T4TOTAL,FREET3,T3FREE,THYROIDAB in the last 72 hours No results found for this basename: VITAMINB12:2,FOLATE:2,FERRITIN:2,TIBC:2,IRON:2,RETICCTPCT:2 in the last 72 hours Imaging results:  Ct Abdomen  Pelvis Wo Contrast  06/30/2011  *RADIOLOGY REPORT*  Clinical Data: Back pain, nausea and vomiting.  CT ABDOMEN AND PELVIS WITHOUT CONTRAST  Technique:  Multidetector CT imaging of the abdomen and pelvis was performed following the standard protocol without intravenous contrast.  Comparison: CT of abdomen and pelvis 02/14/2006.  Findings:  Lung Bases: Small calcified granuloma in the lateral segment of the right middle lobe.  Subsegmental atelectasis versus scarring in the inferior segment of the lingula.  Abdomen/Pelvis:  Diffusely decreased attenuation throughout the hepatic parenchyma, compatible with hepatic steatosis.  Some focal fatty sparing is noted adjacent to the gallbladder fossa.  Multiple calcified gallstones are seen within the dependent portion of the gallbladder, without evidence to suggest acute cholecystitis at this time.  Diffuse fatty atrophy of the pancreas.  The appearance of the spleen and bilateral adrenal glands is unremarkable.  There are low-attenuation lesions in the kidneys bilaterally, largest of which is in the interpolar region of the left kidney measuring up to 8.7 cm in diameter.  These are favored to represent cysts (and are similar to prior study 02/14/2006), but are incompletely characterized on today's noncontrast CT examination.  There are no abnormal calcifications within the collecting system of either kidney, along the course of either ureter, or within the lumen of the urinary bladder to suggest urinary tract calculi.  No signs of hydroureteronephrosis or perinephric stranding to suggest urinary tract obstruction at this time.  There are numerous colonic diverticula, without surrounding inflammatory changes to suggest acute diverticulitis at this time. No ascites or pneumoperitoneum and no pathologic distension of bowel.  No definite pathologic lymphadenopathy identified within the abdomen or pelvis.  Coarse calcifications are noted within the prostate gland, and the  prostate gland appears enlarged.  Urinary bladder is unremarkable.  Musculoskeletal: There are no aggressive appearing lytic or blastic lesions noted in the visualized portions of the skeleton.  IMPRESSION: 1.  No acute findings in the abdomen or pelvis to account for the patient's symptoms.  Specifically, no abnormal urinary tract calculi or findings of urinary tract obstruction. 2.  Cholelithiasis without evidence to suggest acute cholecystitis. 3.  Severe hepatic steatosis. 4.  Low attenuation lesions in the kidneys bilaterally, favored to represent cysts as they are similar to prior examination from 02/14/2006. 5.  Fatty atrophy of the pancreas. 6.  Colonic diverticulosis without evidence to suggest acute diverticulitis. 7.  Prostatomegaly.  Original Report Authenticated By: DANIEL W. ENTRIKIN, M.D.   Dg Chest 2 View  06/30/2011  *RADIOLOGY REPORT*  Clinical Data: Shortness of breath, vomiting.  CHEST - 2 VIEW  Comparison: 09/18/2006  Findings: Calcified granuloma in the right midlung.  Mild cardiomegaly.  No acute airspace opacities or effusions.  No acute bony abnormality.    IMPRESSION: Mild cardiomegaly.  Old granulomatous disease.  No active disease.  Original Report Authenticated By: KEVIN G. DOVER, M.D.   Dg Lumbar Spine Complete  06/24/2011  *RADIOLOGY REPORT*  Clinical Data: Left-sided back pain for several months, no injury  LUMBAR SPINE - COMPLETE 4+ VIEW  Comparison: CT abdomen pelvis of 02/14/2006  Findings: There is very mild curvature of the lumbar spine convex to the right.  The lumbar vertebrae otherwise are in normal alignment.  Intervertebral disc spaces appear normal.  No compression deformity is seen.  IMPRESSION: Mild curvature convex to the right.  Normal intervertebral disc spaces.  No acute abnormality.  Original Report Authenticated By: PAUL D. BARRY, M.D.   Mr Lumbar Spine W Wo Contrast  06/28/2011  *RADIOLOGY REPORT*  Clinical Data: Back pain.  Palpable mass measuring 8 cm in  the lumbar paraspinal area.  MRI LUMBAR SPINE WITHOUT AND WITH CONTRAST  Technique:  Multiplanar and multiecho pulse sequences of the lumbar spine were obtained without and with intravenous contrast.  Contrast: 20mL MULTIHANCE GADOBENATE DIMEGLUMINE 529 MG/ML IV SOLN  BUN and creatinine were obtained on site at [facility] at [address] Results:  BUN not available mg/dL,  Creatinine 0.8 mg/dL. GFR 98  Comparison: 06/24/2011 radiographs.02/14/2006 abdominal CT.  Findings: On the left side of the T11-T12 disc space, there are inflammatory changes.  There appears to be marrow edema in the T12 vertebra, with post-gadolinium enhancement.  There is paravertebral phlegmon on the left which enhances.  This is only partially visualized due to the lumbar field of view.  The origin of this is unclear and the abnormality is incompletely visualized.  This is not have the typical appearance for spinal infection.  MRI thoracic spine with and without contrast is recommended for further assessment.  The area of palpable abnormality was marked with a vitamin E capsules, which are identified on additional coronal images.  There is no mass lesion identified.  The paraspinal soft tissues demonstrate too numerous to count bilateral simple renal cysts without enhancement.  The largest is identified on coronal imaging, measuring 83 mm long axis.  The numbering convention used for this exam terms L5-S1 as the last full intervertebral disc space above the sacrum.  Spinal cord terminates posterior to the L1 vertebra.  There is a mild S-shaped curvature of the lumbar spine.  Dextroconvex apex at L4. Levoconvex apex at the thoracolumbar junction.  L1-L2:  Mild disc degeneration.  No stenosis.  L2-L3:  Negative.  L3-L4:  Negative.  L4-L5:  Disc desiccation with mild loss of height.  Small central disc protrusion is present with mild impression on the thecal sac. Mild narrowing of the right lateral recess without neural  compression.  Central canal appears adequately patent.  Foramina patent.  Right facet mild right facet arthritis with small facet effusion.  L5-S1:  Disc desiccation.  Vacuum disc.  Small central disc protrusion minimally indenting the anterior thecal sac.  Lateral recesses and foramina appear patent.  IMPRESSION: 1.  Inflammatory changes or mass on the left side of the T12 vertebra extending along the T11-T12 paraspinal soft tissues.  This is incompletely visualized due to lumbar field of view.  Thoracic spine MRI with and without infusion recommended for further assessment. 2.  Mild lumbar spondylosis and S-shaped scoliosis.  No compressive stenosis. 3.  No superficial mass lesion is identified in the left flank/back soft tissues.  These results will be called to the ordering clinician or representative by the Radiologist   Assistant, and communication documented in the PACS Dashboard.  Original Report Authenticated By: GEOFFREY LAMKE, M.D.     Assessment & Plan by Problem:   *Nausea & vomiting- ? Viral in nature, CT scan not showing acute issues, symptomatic treatment, further work up if not resolving, IVF to help with dehydation   OSA (obstructive sleep apnea)- CPAP at night   Leukocytosis- ? Viral gastroenteritis   Hypokalemia- check mag and replace K+, recheck BMP in AM, place on tele until K+ WNL  Steatosis- encourage weight loss   Time spent on admission, talking to the patient, and coordinating care was: 40 mins.  Carmencita Cusic, DO 06/30/2011, 2:39 PM  

## 2011-06-30 NOTE — Progress Notes (Deleted)
Patient's PCP: Hollice Espy, MD  Chief Complaint: N/V  History of Present Illness: John Brandt is a 61 y.o. white male who over the  last week, started having nausea, decreased appetitie. Started vomiting 3 days ago. Unable to keep anything down since then. No abd pain. Pain is aching across lower back- this a chronic issue. No radiation down legs. No numbness or weakness to legs. No loss of bowel/bladder control. +normal BM yesterday. No diarrhea, No fevers. No blood in stool or black stools. No urinary symtpoms. No cough or chest congestion. No chest pain or tightness. Started having SOB yesterday- related to anxiety.  MRI done few days ago for back pain showed a ? Mass next to T12 and recommended MRI- will defer to PCP to get done when feeling better. Unless WBCs do not correct and then will do as inpatient to r/o infection    Meds: Scheduled Meds:   . sodium chloride   Intravenous STAT  . ondansetron      . ondansetron  4 mg Intravenous Once  . ondansetron (ZOFRAN) IV  4 mg Intravenous Once  . potassium chloride  10 mEq Intravenous Q1 Hr x 4  . sodium chloride  1,000 mL Intravenous Once   Continuous Infusions:  PRN Meds:. Allergies: Iodine and Penicillins Past Medical History  Diagnosis Date  . Hypertension   . Hyperlipidemia   . Sleep apnea   . Allergic rhinitis   . Hx of cardiac cath    Past Surgical History  Procedure Date  . Cardiac catheterization    Family History  Problem Relation Age of Onset  . Allergies Mother   . Heart disease Mother   . Lung cancer Mother     mesothelioma  . Aneurysm Brother     brain  . Heart disease Father   . Lung cancer Father    History   Social History  . Marital Status: Married    Spouse Name: N/A    Number of Children: N/A  . 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 Smoking status: Never Smoker   . Smokeless tobacco: Never Used  . Alcohol Use: Yes       rare use  . Drug Use: No  . Sexually Active: Not on file   Other Topics Concern  . Not on file   Social History Narrative  . No narrative on file   Review of Systems: All systems reviewed with the patient and positive as per history of present illness, otherwise all other systems are negative.   Physical Exam: Blood pressure 180/100, pulse 89, temperature 97.8 F (36.6 C), temperature source Oral, resp. rate 26, height 6\' 1"  (1.854 m), weight 135.172 kg (298 lb), SpO2 100.00%. General: Awake, Oriented x3, No acute distress, obese HEENT: EOMI, dry mucous membranes Neck: Supple CV: S1 and S2, rrr Lungs: Clear to ascultation bilaterally, no wheezing Abdomen: Soft, Nontender, Nondistended, +bowel sounds. Ext: Good pulses. Trace edema. No clubbing or cyanosis noted. Neuro: Cranial Nerves II-XII grossly intact. Has 5/5 motor strength in upper and lower extremities.    Lab results:  [facility] 06/30/11 1145  NA 139  K 2.9*  CL 100  CO2 20  GLUCOSE 115*  BUN 23  CREATININE 0.94  CALCIUM 10.2  MG --  PHOS --    Basename 06/30/11 1145  AST 33  ALT 39  ALKPHOS 118*  BILITOT 1.1  PROT 8.7*  ALBUMIN  4.2    Basename 06/30/11 1145  LIPASE 37  AMYLASE --    Basename 06/30/11 1145  WBC 17.3*  NEUTROABS 14.1*  HGB 17.1*  HCT 48.1  MCV 81.7  PLT 369    Basename 06/30/11 1145  CKTOTAL 351*  CKMB 4.8*  CKMBINDEX --  TROPONINI <0.30   No components found with this basename: POCBNP:3 No results found for this basename: DDIMER in the last 72 hours No results found for this basename: HGBA1C:2 in the last 72 hours No results found for this basename: CHOL:2,HDL:2,LDLCALC:2,TRIG:2,CHOLHDL:2,LDLDIRECT:2 in the last 72 hours No results found for this basename: TSH,T4TOTAL,FREET3,T3FREE,THYROIDAB in the last 72 hours No results found for this basename: VITAMINB12:2,FOLATE:2,FERRITIN:2,TIBC:2,IRON:2,RETICCTPCT:2 in the last 72 hours Imaging results:  Ct Abdomen  Pelvis Wo Contrast  06/30/2011  *RADIOLOGY REPORT*  Clinical Data: Back pain, nausea and vomiting.  CT ABDOMEN AND PELVIS WITHOUT CONTRAST  Technique:  Multidetector CT imaging of the abdomen and pelvis was performed following the standard protocol without intravenous contrast.  Comparison: CT of abdomen and pelvis 02/14/2006.  Findings:  Lung Bases: Small calcified granuloma in the lateral segment of the right middle lobe.  Subsegmental atelectasis versus scarring in the inferior segment of the lingula.  Abdomen/Pelvis:  Diffusely decreased attenuation throughout the hepatic parenchyma, compatible with hepatic steatosis.  Some focal fatty sparing is noted adjacent to the gallbladder fossa.  Multiple calcified gallstones are seen within the dependent portion of the gallbladder, without evidence to suggest acute cholecystitis at this time.  Diffuse fatty atrophy of the pancreas.  The appearance of the spleen and bilateral adrenal glands is unremarkable.  There are low-attenuation lesions in the kidneys bilaterally, largest of which is in the interpolar region of the left kidney measuring up to 8.7 cm in diameter.  These are favored to represent cysts (and are similar to prior study 02/14/2006), but are incompletely characterized on today's noncontrast CT examination.  There are no abnormal calcifications within the collecting system of either kidney, along the course of either ureter, or within the lumen of the urinary bladder to suggest urinary tract calculi.  No signs of hydroureteronephrosis or perinephric stranding to suggest urinary tract obstruction at this time.  There are numerous colonic diverticula, without surrounding inflammatory changes to suggest acute diverticulitis at this time. No ascites or pneumoperitoneum and no pathologic distension of bowel.  No definite pathologic lymphadenopathy identified within the abdomen or pelvis.  Coarse calcifications are noted within the prostate gland, and the  prostate gland appears enlarged.  Urinary bladder is unremarkable.  Musculoskeletal: There are no aggressive appearing lytic or blastic lesions noted in the visualized portions of the skeleton.  IMPRESSION: 1.  No acute findings in the abdomen or pelvis to account for the patient's symptoms.  Specifically, no abnormal urinary tract calculi or findings of urinary tract obstruction. 2.  Cholelithiasis without evidence to suggest acute cholecystitis. 3.  Severe hepatic steatosis. 4.  Low attenuation lesions in the kidneys bilaterally, favored to represent cysts as they are similar to prior examination from 02/14/2006. 5.  Fatty atrophy of the pancreas. 6.  Colonic diverticulosis without evidence to suggest acute diverticulitis. 7.  Prostatomegaly.  Original Report Authenticated By: Florencia Reasons, M.D.   Dg Chest 2 View  06/30/2011  *RADIOLOGY REPORT*  Clinical Data: Shortness of breath, vomiting.  CHEST - 2 VIEW  Comparison: 09/18/2006  Findings: Calcified granuloma in the right midlung.  Mild cardiomegaly.  No acute airspace opacities or effusions.  No acute bony abnormality.  IMPRESSION: Mild cardiomegaly.  Old granulomatous disease.  No active disease.  Original Report Authenticated By: Cyndie Chime, M.D.   Dg Lumbar Spine Complete  06/24/2011  *RADIOLOGY REPORT*  Clinical Data: Left-sided back pain for several months, no injury  LUMBAR SPINE - COMPLETE 4+ VIEW  Comparison: CT abdomen pelvis of 02/14/2006  Findings: There is very mild curvature of the lumbar spine convex to the right.  The lumbar vertebrae otherwise are in normal alignment.  Intervertebral disc spaces appear normal.  No compression deformity is seen.  IMPRESSION: Mild curvature convex to the right.  Normal intervertebral disc spaces.  No acute abnormality.  Original Report Authenticated By: Juline Patch, M.D.   Mr Lumbar Spine W Wo Contrast  06/28/2011  *RADIOLOGY REPORT*  Clinical Data: Back pain.  Palpable mass measuring 8 cm in  the lumbar paraspinal area.  MRI LUMBAR SPINE WITHOUT AND WITH CONTRAST  Technique:  Multiplanar and multiecho pulse sequences of the lumbar spine were obtained without and with intravenous contrast.  Contrast: 20mL MULTIHANCE GADOBENATE DIMEGLUMINE 529 MG/ML IV SOLN  BUN and creatinine were obtained on site at [facility] at [address] Results:  BUN not available mg/dL,  Creatinine 0.8 mg/dL. GFR 98  Comparison: 06/24/2011 radiographs.02/14/2006 abdominal CT.  Findings: On the left side of the T11-T12 disc space, there are inflammatory changes.  There appears to be marrow edema in the T12 vertebra, with post-gadolinium enhancement.  There is paravertebral phlegmon on the left which enhances.  This is only partially visualized due to the lumbar field of view.  The origin of this is unclear and the abnormality is incompletely visualized.  This is not have the typical appearance for spinal infection.  MRI thoracic spine with and without contrast is recommended for further assessment.  The area of palpable abnormality was marked with a vitamin E capsules, which are identified on additional coronal images.  There is no mass lesion identified.  The paraspinal soft tissues demonstrate too numerous to count bilateral simple renal cysts without enhancement.  The largest is identified on coronal imaging, measuring 83 mm long axis.  The numbering convention used for this exam terms L5-S1 as the last full intervertebral disc space above the sacrum.  Spinal cord terminates posterior to the L1 vertebra.  There is a mild S-shaped curvature of the lumbar spine.  Dextroconvex apex at L4. Levoconvex apex at the thoracolumbar junction.  L1-L2:  Mild disc degeneration.  No stenosis.  L2-L3:  Negative.  L3-L4:  Negative.  L4-L5:  Disc desiccation with mild loss of height.  Small central disc protrusion is present with mild impression on the thecal sac. Mild narrowing of the right lateral recess without neural  compression.  Central canal appears adequately patent.  Foramina patent.  Right facet mild right facet arthritis with small facet effusion.  L5-S1:  Disc desiccation.  Vacuum disc.  Small central disc protrusion minimally indenting the anterior thecal sac.  Lateral recesses and foramina appear patent.  IMPRESSION: 1.  Inflammatory changes or mass on the left side of the T12 vertebra extending along the T11-T12 paraspinal soft tissues.  This is incompletely visualized due to lumbar field of view.  Thoracic spine MRI with and without infusion recommended for further assessment. 2.  Mild lumbar spondylosis and S-shaped scoliosis.  No compressive stenosis. 3.  No superficial mass lesion is identified in the left flank/back soft tissues.  These results will be called to the ordering clinician or representative by the Radiologist  Assistant, and communication documented in the PACS Dashboard.  Original Report Authenticated By: Andreas Newport, M.D.     Assessment & Plan by Problem:   *Nausea & vomiting- ? Viral in nature, CT scan not showing acute issues, symptomatic treatment, further work up if not resolving, IVF to help with dehydation   OSA (obstructive sleep apnea)- CPAP at night   Leukocytosis- ? Viral gastroenteritis   Hypokalemia- check mag and replace K+, recheck BMP in AM, place on tele until K+ WNL  Steatosis- encourage weight loss   Time spent on admission, talking to the patient, and coordinating care was: 40 mins.  Shannyn Jankowiak, DO 06/30/2011, 2:39 PM

## 2011-06-30 NOTE — ED Notes (Signed)
Pt. Is unable to use the restroom at this time, but has a urinal at the bedside.

## 2011-07-01 ENCOUNTER — Other Ambulatory Visit: Payer: BC Managed Care – PPO

## 2011-07-01 ENCOUNTER — Inpatient Hospital Stay (HOSPITAL_COMMUNITY): Payer: BC Managed Care – PPO

## 2011-07-01 ENCOUNTER — Inpatient Hospital Stay: Admission: RE | Admit: 2011-07-01 | Payer: BC Managed Care – PPO | Source: Ambulatory Visit

## 2011-07-01 DIAGNOSIS — R112 Nausea with vomiting, unspecified: Secondary | ICD-10-CM

## 2011-07-01 DIAGNOSIS — E876 Hypokalemia: Secondary | ICD-10-CM

## 2011-07-01 DIAGNOSIS — D7289 Other specified disorders of white blood cells: Secondary | ICD-10-CM

## 2011-07-01 DIAGNOSIS — M545 Low back pain: Secondary | ICD-10-CM

## 2011-07-01 LAB — COMPREHENSIVE METABOLIC PANEL
ALT: 36 U/L (ref 0–53)
Alkaline Phosphatase: 94 U/L (ref 39–117)
CO2: 23 mEq/L (ref 19–32)
GFR calc Af Amer: >90 mL/min (ref >90)
GFR calc non Af Amer: 90 mL/min — ABNORMAL LOW (ref >90)
Glucose, Bld: 118 mg/dL — ABNORMAL HIGH (ref 70–99)
Potassium: 2.8 mEq/L — ABNORMAL LOW (ref 3.5–5.1)
Sodium: 142 mEq/L (ref 135–145)
Total Bilirubin: 0.8 mg/dL (ref 0.3–1.2)

## 2011-07-01 LAB — LIPASE, BLOOD: Lipase: 59 U/L (ref 11–59)

## 2011-07-01 LAB — CBC
MCHC: 33.5 g/dL (ref 30.0–36.0)
Platelets: 264 10*3/uL (ref 150–400)
RDW: 13.9 % (ref 11.5–15.5)

## 2011-07-01 LAB — CARDIAC PANEL(CRET KIN+CKTOT+MB+TROPI): Total CK: 264 U/L — ABNORMAL HIGH (ref 7–232)

## 2011-07-01 MED ORDER — PROMETHAZINE HCL 25 MG/ML IJ SOLN
12.5000 mg | Freq: Three times a day (TID) | INTRAMUSCULAR | Status: DC | PRN
  Administered 2011-07-01 – 2011-07-02 (×2): 12.5 mg via INTRAVENOUS
  Filled 2011-07-01 (×2): qty 1

## 2011-07-01 MED ORDER — ONDANSETRON HCL 4 MG/2ML IJ SOLN
4.0000 mg | Freq: Four times a day (QID) | INTRAMUSCULAR | Status: DC
  Administered 2011-07-01 – 2011-07-02 (×4): 4 mg via INTRAVENOUS
  Filled 2011-07-01 (×5): qty 2

## 2011-07-01 MED ORDER — POTASSIUM CHLORIDE 10 MEQ/100ML IV SOLN
10.0000 meq | INTRAVENOUS | Status: DC
  Administered 2011-07-01: 10 meq via INTRAVENOUS
  Filled 2011-07-01 (×4): qty 100

## 2011-07-01 MED ORDER — POTASSIUM CHLORIDE IN NACL 40-0.9 MEQ/L-% IV SOLN
INTRAVENOUS | Status: DC
  Administered 2011-07-01 – 2011-07-02 (×2): via INTRAVENOUS
  Filled 2011-07-01 (×3): qty 1000

## 2011-07-01 MED ORDER — HYDROCODONE-ACETAMINOPHEN 5-325 MG PO TABS
1.0000 | ORAL_TABLET | Freq: Four times a day (QID) | ORAL | Status: DC | PRN
  Administered 2011-07-01 – 2011-07-02 (×2): 1 via ORAL
  Filled 2011-07-01 (×2): qty 1

## 2011-07-01 MED ORDER — METOCLOPRAMIDE HCL 5 MG/ML IJ SOLN
5.0000 mg | Freq: Four times a day (QID) | INTRAMUSCULAR | Status: DC
  Administered 2011-07-01 – 2011-07-02 (×5): 5 mg via INTRAVENOUS
  Filled 2011-07-01 (×2): qty 1
  Filled 2011-07-01: qty 2
  Filled 2011-07-01 (×2): qty 1
  Filled 2011-07-01 (×2): qty 2

## 2011-07-01 NOTE — Care Management Note (Unsigned)
    Page 1 of 1   07/01/2011     4:42:54 PM   CARE MANAGEMENT NOTE 07/01/2011  Patient:  John Brandt, John Brandt   Account Number:  [id number]  Date Initiated:  07/01/2011  Documentation initiated by:  Lanier Clam  Subjective/Objective Assessment:   ADMITTED W/N/V     Action/Plan:   FROM HOME   Anticipated DC Date:  07/05/2011   Anticipated DC Plan:  HOME/SELF CARE      DC Planning Services  CM consult      Choice offered to / List presented to:             Status of service:  In process, will continue to follow Medicare Important Message given?   (If response is "NO", the following Medicare IM given date fields will be blank) Date Medicare IM given:   Date Additional Medicare IM given:    Discharge Disposition:    Per UR Regulation:  Reviewed for med. necessity/level of care/duration of stay  If discussed at Long Length of Stay Meetings, dates discussed:    Comments:  07/01/11 [facility] RN,BSN NCM [phone]

## 2011-07-01 NOTE — Progress Notes (Signed)
Patient seen and examined by me.  Agree with plan by Toya Smothers.  Had BM since being seen.  Will change PRN zofran to ATC.  Continue with KUB and reglan.  ?viral in nature- trend labs  Marlin Canary DO

## 2011-07-01 NOTE — Progress Notes (Signed)
Nutrition Note  Pt triggered on the nutrition risk report for unintentional weight loss.  Pt reports weight lost only with current GI illness.  BMI is 37 which meets criteria for obesity grade 2.  Tolerating clear liquid diet.  No nutrition intervention at this time.  Oran Rein, RD [phone]

## 2011-07-01 NOTE — Progress Notes (Signed)
[facility] PROGRESS NOTE  John Brandt ZOX:[id number] DOB: 15-Dec-1950 DOA: 06/30/2011 PCP: Hollice Espy, MD  Brief narrative: 61 y.o. white male admitted with persistent n/v for 3-4 days.  No abd pain. Has been undergoing OP work up for low back pain for several weeks and has developed constipation likely from meds. Lower back- this a chronic issue. No radiation down legs. No numbness or weakness to legs. No loss of bowel/bladder control. Last BM 06/30/11 was small and hard. No diarrhea, No fevers. No blood in stool or Yaris Ferrell stools. No urinary symtpoms. No cough or chest congestion. No chest pain or tightness.    Consultants:    Procedures:    Antibiotics:    HPI/Subjective: Sitting up in chair holding alcohol pad under nose. States zofran "help a little". Reports only slight improvement with nausea and one emesis episode this am. Also reports intermittent "cramping" like pain in lower abdomen.   Objective: Filed Vitals:   06/30/11 1834 06/30/11 1835 06/30/11 2100 07/01/11 0617  BP: 146/99 146/99 121/85 155/71  Pulse: 102  86 76  Temp:   97.6 F (36.4 C) 97.9 F (36.6 C)  TempSrc:   Oral Oral  Resp:   25 22  Height:      Weight:      SpO2:   97% 97%    Intake/Output Summary (Last 24 hours) at 07/01/11 0849 Last data filed at 06/30/11 2300  Gross per 24 hour  Intake 1108.75 ml  Output      0 ml  Net 1108.75 ml    Exam:   General:  Awake, alert, oriented x3. Obviously does not feel well. NAD  Cardiovascular: RRR, no MGR, no LEE  Respiratory: Normal effort. BSCTAB, no wheeze, rhonchi  Abdomen: obese, soft, VERY sluggish BS. Non-tender to palp  Data Reviewed: Basic Metabolic Panel:  Lab 07/01/11 8119 06/30/11 1145 06/30/11 1115  NA 142 139 --  K 2.8* 2.9* --  CL 108 100 --  CO2 23 20 --  GLUCOSE 118* 115* --  BUN 21 23 --  CREATININE 0.89 0.94 --  CALCIUM 9.1 10.2 --  MG -- -- 2.2  PHOS -- -- --   Liver Function Tests:  Lab  07/01/11 0456 06/30/11 1145  AST 27 33  ALT 36 39  ALKPHOS 94 118*  BILITOT 0.8 1.1  PROT 7.3 8.7*  ALBUMIN 3.5 4.2    Lab 07/01/11 0456 06/30/11 1145  LIPASE 59 37  AMYLASE -- --   No results found for this basename: AMMONIA:5 in the last 168 hours CBC:  Lab 07/01/11 0456 06/30/11 1145  WBC 12.9* 17.3*  NEUTROABS -- 14.1*  HGB 15.0 17.1*  HCT 44.8 48.1  MCV 84.1 81.7  PLT 264 369   Cardiac Enzymes:  Lab 07/01/11 0456 06/30/11 1145  CKTOTAL 264* 351*  CKMB 4.0 4.8*  CKMBINDEX -- --  TROPONINI <0.30 <0.30   BNP: No components found with this basename: POCBNP:5 CBG: No results found for this basename: GLUCAP:5 in the last 168 hours  No results found for this or any previous visit (from the past 240 hour(s)).   Studies: Ct Abdomen Pelvis Wo Contrast  06/30/2011  *RADIOLOGY REPORT*  Clinical Data: Back pain, nausea and vomiting.  CT ABDOMEN AND PELVIS WITHOUT CONTRAST  Technique:  Multidetector CT imaging of the abdomen and pelvis was performed following the standard protocol without intravenous contrast.  Comparison: CT of abdomen and pelvis 02/14/2006.  Findings:  Lung Bases: Small calcified granuloma in the  lateral segment of the right middle lobe.  Subsegmental atelectasis versus scarring in the inferior segment of the lingula.  Abdomen/Pelvis:  Diffusely decreased attenuation throughout the hepatic parenchyma, compatible with hepatic steatosis.  Some focal fatty sparing is noted adjacent to the gallbladder fossa.  Multiple calcified gallstones are seen within the dependent portion of the gallbladder, without evidence to suggest acute cholecystitis at this time.  Diffuse fatty atrophy of the pancreas.  The appearance of the spleen and bilateral adrenal glands is unremarkable.  There are low-attenuation lesions in the kidneys bilaterally, largest of which is in the interpolar region of the left kidney measuring up to 8.7 cm in diameter.  These are favored to represent cysts  (and are similar to prior study 02/14/2006), but are incompletely characterized on today's noncontrast CT examination.  There are no abnormal calcifications within the collecting system of either kidney, along the course of either ureter, or within the lumen of the urinary bladder to suggest urinary tract calculi.  No signs of hydroureteronephrosis or perinephric stranding to suggest urinary tract obstruction at this time.  There are numerous colonic diverticula, without surrounding inflammatory changes to suggest acute diverticulitis at this time. No ascites or pneumoperitoneum and no pathologic distension of bowel.  No definite pathologic lymphadenopathy identified within the abdomen or pelvis.  Coarse calcifications are noted within the prostate gland, and the prostate gland appears enlarged.  Urinary bladder is unremarkable.  Musculoskeletal: There are no aggressive appearing lytic or blastic lesions noted in the visualized portions of the skeleton.  IMPRESSION: 1.  No acute findings in the abdomen or pelvis to account for the patient's symptoms.  Specifically, no abnormal urinary tract calculi or findings of urinary tract obstruction. 2.  Cholelithiasis without evidence to suggest acute cholecystitis. 3.  Severe hepatic steatosis. 4.  Low attenuation lesions in the kidneys bilaterally, favored to represent cysts as they are similar to prior examination from 02/14/2006. 5.  Fatty atrophy of the pancreas. 6.  Colonic diverticulosis without evidence to suggest acute diverticulitis. 7.  Prostatomegaly.  Original Report Authenticated By: Florencia Reasons, M.D.   Dg Chest 2 View  06/30/2011  *RADIOLOGY REPORT*  Clinical Data: Shortness of breath, vomiting.  CHEST - 2 VIEW  Comparison: 09/18/2006  Findings: Calcified granuloma in the right midlung.  Mild cardiomegaly.  No acute airspace opacities or effusions.  No acute bony abnormality.  IMPRESSION: Mild cardiomegaly.  Old granulomatous disease.  No active  disease.  Original Report Authenticated By: Cyndie Chime, M.D.    Scheduled Meds:   . amLODipine  10 mg Oral Daily  . aspirin EC  81 mg Oral Daily  . atorvastatin  10 mg Oral q1800  . cloNIDine  0.2 mg Oral TID  . enoxaparin  60 mg Subcutaneous Q24H  . fluticasone  1 spray Each Nare Daily  . metoprolol succinate  50 mg Oral Daily  . multivitamin with minerals  1 tablet Oral Daily  . ondansetron  4 mg Intravenous Once  . potassium chloride  10 mEq Intravenous Q1 Hr x 4  . potassium chloride SA  40 mEq Oral TID  . sodium chloride  1,000 mL Intravenous Once  . sodium chloride  3 mL Intravenous Q12H  . spironolactone  25 mg Oral Daily  . DISCONTD: sodium chloride   Intravenous STAT  . DISCONTD: enoxaparin  40 mg Subcutaneous Q24H  . DISCONTD: ondansetron (ZOFRAN) IV  4 mg Intravenous Once   Continuous Infusions:   . sodium chloride 75  mL/hr at 07/01/11 0538     Assessment/Plan: 1. Nausea & vomiting- ? Viral in nature or possible ileus vs severe constipation. No real improvement.   CT scan not showing acute issues. Will get KUB. Continue with clear liquid diet and will start reglan. Continue anti-emetic.Continue IV fluids for hydration   2.OSA (obstructive sleep apnea)- CPAP at night  3.Leukocytosis- ? Viral gastroenteritis. Trending down. Afebrile, non-toxic appearing.    4. Hypokalemia-  Likely related to #1. Mag level 2.2.  Will replace K+, recheck BMP in AM, place on tele until K+ WNL  5.Steatosis- encourage weight loss  6. Chronic back pain. OP work up in progress. Pain is at baseline. Will provide pain med as needed. 1. 7. HTN fair control. Will continue home meds, norvasc, BB, spironolactone. monitor  Code Status: full Family Communication: wife and pt at bedside Disposition Plan: home when medically stable. Hopefully 24-48hrs.    Gwenyth Bender, MD  [facility] Pager [phone]  If 7PM-7AM, please contact night-coverage [email or web address] Password  [facility] 07/01/2011, 8:49 AM   LOS: 1 day

## 2011-07-02 ENCOUNTER — Other Ambulatory Visit: Payer: BC Managed Care – PPO

## 2011-07-02 DIAGNOSIS — D7289 Other specified disorders of white blood cells: Secondary | ICD-10-CM

## 2011-07-02 DIAGNOSIS — E876 Hypokalemia: Secondary | ICD-10-CM

## 2011-07-02 DIAGNOSIS — R112 Nausea with vomiting, unspecified: Secondary | ICD-10-CM

## 2011-07-02 DIAGNOSIS — M545 Low back pain: Secondary | ICD-10-CM

## 2011-07-02 LAB — BASIC METABOLIC PANEL
BUN: 22 mg/dL (ref 6–23)
Chloride: 109 mEq/L (ref 96–112)
Creatinine, Ser: 1.09 mg/dL (ref 0.50–1.35)
GFR calc Af Amer: 82 mL/min — ABNORMAL LOW (ref >90)

## 2011-07-02 LAB — CBC
HCT: 38.7 % — ABNORMAL LOW (ref 39.0–52.0)
MCHC: 32.8 g/dL (ref 30.0–36.0)
MCV: 84.7 fL (ref 78.0–100.0)
RDW: 14 % (ref 11.5–15.5)
WBC: 9.5 10*3/uL (ref 4.0–10.5)

## 2011-07-02 MED ORDER — HYDROCODONE-ACETAMINOPHEN 5-325 MG PO TABS: 1.0000 | ORAL_TABLET | Freq: Four times a day (QID) | ORAL | Status: AC | PRN

## 2011-07-02 MED ORDER — ONDANSETRON HCL 4 MG PO TABS: 4.0000 mg | ORAL_TABLET | Freq: Three times a day (TID) | ORAL | Status: AC | PRN

## 2011-07-02 MED ORDER — CLONIDINE HCL 0.2 MG PO TABS: 0.2000 mg | ORAL_TABLET | Freq: Three times a day (TID) | ORAL | Status: AC

## 2011-07-02 MED ORDER — METOPROLOL SUCCINATE ER 50 MG PO TB24
50.0000 mg | ORAL_TABLET | Freq: Every day | ORAL | Status: DC
  Administered 2011-07-02: 50 mg via ORAL
  Filled 2011-07-02: qty 1

## 2011-07-02 NOTE — Progress Notes (Signed)
Pt's HR sustaining in the 40's/50's overnight occasionally dropping in the 30's. Pt assymptomatic when woke up and assessed. HR also goes up to the 70's when awakened. VSS. NP on call notified. Parameters entered on norvasc and toprol, EKG obtained.   Simren Popson, Ok Edwards RN

## 2011-07-02 NOTE — Discharge Instructions (Signed)
B.R.A.T. Diet Your doctor has recommended the B.R.A.T. diet for you or your child until the condition improves. This is often used to help control diarrhea and vomiting symptoms. If you or your child can tolerate clear liquids, you may have:  Bananas.   Rice.   Applesauce.   Toast (and other simple starches such as crackers, potatoes, noodles).  Be sure to avoid dairy products, meats, and fatty foods until symptoms are better. Fruit juices such as apple, grape, and prune juice can make diarrhea worse. Avoid these. Continue this diet for 2 days or as instructed by your caregiver. Document Released: 12/20/2004 Document Revised: 12/09/2010 Document Reviewed: 06/08/2006 [employer] Patient Information 2012 [employer]. 

## 2011-07-02 NOTE — Discharge Summary (Signed)
Discharge Summary  Gabriella Guile MR#: [id number]  DOB:1950-04-03  Date of Admission: 06/30/2011 Date of Discharge: 07/02/2011  Patient's PCP: Hollice Espy, MD  Attending Physician:Azahel Belcastro   Discharge Diagnoses: Principal Problem:  *Nausea & vomiting Active Problems:  OSA (obstructive sleep apnea)  Leukocytosis  Hypokalemia  Hepatic steatosis   Brief Admitting History and Physical Jhovani Griswold is a 61 y.o. white male who over the last week, started having nausea, decreased appetitie. Started vomiting 3 days ago. Unable to keep anything down since then. No abd pain. Pain is aching across lower back- this a chronic issue. No radiation down legs. No numbness or weakness to legs. No loss of bowel/bladder control. +normal BM yesterday. No diarrhea, No fevers. No blood in stool or black stools. No urinary symtpoms. No cough or chest congestion. No chest pain or tightness. Started having SOB yesterday- related to anxiety.  MRI done few days ago for back pain showed a ? Mass next to T12 and recommended MRI- will defer to PCP to get done when feeling better. Unless WBCs do not correct and then will do as inpatient to r/o infection   Discharge Medications Medication List  As of 07/02/2011  2:51 PM   ASK your doctor about these medications         amLODipine 10 MG tablet   Commonly known as: NORVASC   10 mg. 1 by mouth daily      aspirin 81 MG tablet   Take 81 mg by mouth daily.      atorvastatin 10 MG tablet   Commonly known as: LIPITOR   10 mg. 1 by mouth daily      cloNIDine 0.2 MG tablet   Commonly known as: CATAPRES   Take 0.2 mg by mouth 3 (three) times daily.      KLOR-CON M20 20 MEQ tablet   Generic drug: potassium chloride SA   Take 40 mEq by mouth 3 (three) times daily. 2 tablets three times dailt      metoprolol succinate 50 MG 24 hr tablet   Commonly known as: TOPROL-XL   Take 50 mg by mouth daily. 1 by mouth daily      multivitamin tablet   Take 1 tablet by mouth daily.      spironolactone 25 MG tablet   Commonly known as: ALDACTONE   Take 25 mg by mouth daily. 1 by mouth daily      traMADol 50 MG tablet   Commonly known as: ULTRAM   Take 50 mg by mouth every 8 (eight) hours as needed. pain      triamcinolone 55 MCG/ACT nasal inhaler   Commonly known as: NASACORT   Place 2 sprays into the nose daily. 2 sprays in each nostril daily      Vitamin D (Ergocalciferol) 50000 UNITS Caps   Commonly known as: DRISDOL   50,000 Units. 1 tablet weekly            Hospital Course: Probable viral gastroenteritis- stable, nausea decreased, leukocytosis resolved   HTN- BP well controlled- instructed to check at home- can hold clonidine if remaining low   Day of Discharge BP 103/72  Pulse 52  Temp 97.5 F (36.4 C) (Oral)  Resp 20  Ht 6\' 1"  (1.854 m)  Wt 127.733 kg (281 lb 9.6 oz)  BMI 37.15 kg/m2  SpO2 95% A+Ox3 NAD -c/c/e +BS, soft, NT/ND  Results for orders placed during the hospital encounter of 06/30/11 (from the past 48 hour(s))  COMPREHENSIVE METABOLIC PANEL     Status: Abnormal   Collection Time   07/01/11  4:56 AM      Component Value Range Comment   Sodium 142  135 - 145 mEq/L    Potassium 2.8 (*) 3.5 - 5.1 mEq/L    Chloride 108  96 - 112 mEq/L    CO2 23  19 - 32 mEq/L    Glucose, Bld 118 (*) 70 - 99 mg/dL    BUN 21  6 - 23 mg/dL    Creatinine, Ser 0.98  0.50 - 1.35 mg/dL    Calcium 9.1  8.4 - 11.9 mg/dL    Total Protein 7.3  6.0 - 8.3 g/dL    Albumin 3.5  3.5 - 5.2 g/dL    AST 27  0 - 37 U/L    ALT 36  0 - 53 U/L    Alkaline Phosphatase 94  39 - 117 U/L    Total Bilirubin 0.8  0.3 - 1.2 mg/dL    GFR calc non Af Amer 90 (*) >90 mL/min    GFR calc Af Amer >90  >90 mL/min   CBC     Status: Abnormal   Collection Time   07/01/11  4:56 AM      Component Value Range Comment   WBC 12.9 (*) 4.0 - 10.5 K/uL    RBC 5.33  4.22 - 5.81 MIL/uL    Hemoglobin 15.0  13.0 - 17.0 g/dL    HCT 14.7  82.9 - 56.2 %     MCV 84.1  78.0 - 100.0 fL    MCH 28.1  26.0 - 34.0 pg    MCHC 33.5  30.0 - 36.0 g/dL    RDW 13.0  86.5 - 78.4 %    Platelets 264  150 - 400 K/uL   LIPASE, BLOOD     Status: Normal   Collection Time   07/01/11  4:56 AM      Component Value Range Comment   Lipase 59  11 - 59 U/L   CARDIAC PANEL(CRET KIN+CKTOT+MB+TROPI)     Status: Abnormal   Collection Time   07/01/11  4:56 AM      Component Value Range Comment   Total CK 264 (*) 7 - 232 U/L    CK, MB 4.0  0.3 - 4.0 ng/mL    Troponin I <0.30  <0.30 ng/mL    Relative Index 1.5  0.0 - 2.5   CBC     Status: Abnormal   Collection Time   07/02/11  5:12 AM      Component Value Range Comment   WBC 9.5  4.0 - 10.5 K/uL    RBC 4.57  4.22 - 5.81 MIL/uL    Hemoglobin 12.7 (*) 13.0 - 17.0 g/dL    HCT 69.6 (*) 29.5 - 52.0 %    MCV 84.7  78.0 - 100.0 fL    MCH 27.8  26.0 - 34.0 pg    MCHC 32.8  30.0 - 36.0 g/dL    RDW 28.4  13.2 - 44.0 %    Platelets 201  150 - 400 K/uL   BASIC METABOLIC PANEL     Status: Abnormal   Collection Time   07/02/11  5:12 AM      Component Value Range Comment   Sodium 139  135 - 145 mEq/L    Potassium 3.5  3.5 - 5.1 mEq/L    Chloride 109  96 - 112 mEq/L  CO2 24  19 - 32 mEq/L    Glucose, Bld 108 (*) 70 - 99 mg/dL    BUN 22  6 - 23 mg/dL    Creatinine, Ser 9.60  0.50 - 1.35 mg/dL    Calcium 8.7  8.4 - 45.4 mg/dL    GFR calc non Af Amer 71 (*) >90 mL/min    GFR calc Af Amer 82 (*) >90 mL/min     Ct Abdomen Pelvis Wo Contrast  06/30/2011  *RADIOLOGY REPORT*  Clinical Data: Back pain, nausea and vomiting.  CT ABDOMEN AND PELVIS WITHOUT CONTRAST  Technique:  Multidetector CT imaging of the abdomen and pelvis was performed following the standard protocol without intravenous contrast.  Comparison: CT of abdomen and pelvis 02/14/2006.  Findings:  Lung Bases: Small calcified granuloma in the lateral segment of the right middle lobe.  Subsegmental atelectasis versus scarring in the inferior segment of the lingula.   Abdomen/Pelvis:  Diffusely decreased attenuation throughout the hepatic parenchyma, compatible with hepatic steatosis.  Some focal fatty sparing is noted adjacent to the gallbladder fossa.  Multiple calcified gallstones are seen within the dependent portion of the gallbladder, without evidence to suggest acute cholecystitis at this time.  Diffuse fatty atrophy of the pancreas.  The appearance of the spleen and bilateral adrenal glands is unremarkable.  There are low-attenuation lesions in the kidneys bilaterally, largest of which is in the interpolar region of the left kidney measuring up to 8.7 cm in diameter.  These are favored to represent cysts (and are similar to prior study 02/14/2006), but are incompletely characterized on today's noncontrast CT examination.  There are no abnormal calcifications within the collecting system of either kidney, along the course of either ureter, or within the lumen of the urinary bladder to suggest urinary tract calculi.  No signs of hydroureteronephrosis or perinephric stranding to suggest urinary tract obstruction at this time.  There are numerous colonic diverticula, without surrounding inflammatory changes to suggest acute diverticulitis at this time. No ascites or pneumoperitoneum and no pathologic distension of bowel.  No definite pathologic lymphadenopathy identified within the abdomen or pelvis.  Coarse calcifications are noted within the prostate gland, and the prostate gland appears enlarged.  Urinary bladder is unremarkable.  Musculoskeletal: There are no aggressive appearing lytic or blastic lesions noted in the visualized portions of the skeleton.  IMPRESSION: 1.  No acute findings in the abdomen or pelvis to account for the patient's symptoms.  Specifically, no abnormal urinary tract calculi or findings of urinary tract obstruction. 2.  Cholelithiasis without evidence to suggest acute cholecystitis. 3.  Severe hepatic steatosis. 4.  Low attenuation lesions in the  kidneys bilaterally, favored to represent cysts as they are similar to prior examination from 02/14/2006. 5.  Fatty atrophy of the pancreas. 6.  Colonic diverticulosis without evidence to suggest acute diverticulitis. 7.  Prostatomegaly.  Original Report Authenticated By: Florencia Reasons, M.D.   Dg Chest 2 View  06/30/2011  *RADIOLOGY REPORT*  Clinical Data: Shortness of breath, vomiting.  CHEST - 2 VIEW  Comparison: 09/18/2006  Findings: Calcified granuloma in the right midlung.  Mild cardiomegaly.  No acute airspace opacities or effusions.  No acute bony abnormality.  IMPRESSION: Mild cardiomegaly.  Old granulomatous disease.  No active disease.  Original Report Authenticated By: Cyndie Chime, M.D.   Dg Lumbar Spine Complete  06/24/2011  *RADIOLOGY REPORT*  Clinical Data: Left-sided back pain for several months, no injury  LUMBAR SPINE - COMPLETE 4+ VIEW  Comparison:  CT abdomen pelvis of 02/14/2006  Findings: There is very mild curvature of the lumbar spine convex to the right.  The lumbar vertebrae otherwise are in normal alignment.  Intervertebral disc spaces appear normal.  No compression deformity is seen.  IMPRESSION: Mild curvature convex to the right.  Normal intervertebral disc spaces.  No acute abnormality.  Original Report Authenticated By: Juline Patch, M.D.   Dg Abd 1 View  07/01/2011  *RADIOLOGY REPORT*  Clinical Data: Persistent nausea, vomiting.  ABDOMEN - 1 VIEW  Comparison: CT 06/30/2011  Findings: Cholelithiasis.  Normal bowel gas pattern.  No free air. No organomegaly or acute bony abnormality.  IMPRESSION: Cholelithiasis.  No acute findings.  Original Report Authenticated By: Cyndie Chime, M.D.   Mr Lumbar Spine W Wo Contrast  06/28/2011  *RADIOLOGY REPORT*  Clinical Data: Back pain.  Palpable mass measuring 8 cm in the lumbar paraspinal area.  MRI LUMBAR SPINE WITHOUT AND WITH CONTRAST  Technique:  Multiplanar and multiecho pulse sequences of the lumbar spine were obtained  without and with intravenous contrast.  Contrast: 20mL MULTIHANCE GADOBENATE DIMEGLUMINE 529 MG/ML IV SOLN  BUN and creatinine were obtained on site at [facility] at [address] Results:  BUN not available mg/dL,  Creatinine 0.8 mg/dL. GFR 98  Comparison: 06/24/2011 radiographs.02/14/2006 abdominal CT.  Findings: On the left side of the T11-T12 disc space, there are inflammatory changes.  There appears to be marrow edema in the T12 vertebra, with post-gadolinium enhancement.  There is paravertebral phlegmon on the left which enhances.  This is only partially visualized due to the lumbar field of view.  The origin of this is unclear and the abnormality is incompletely visualized.  This is not have the typical appearance for spinal infection.  MRI thoracic spine with and without contrast is recommended for further assessment.  The area of palpable abnormality was marked with a vitamin E capsules, which are identified on additional coronal images.  There is no mass lesion identified.  The paraspinal soft tissues demonstrate too numerous to count bilateral simple renal cysts without enhancement.  The largest is identified on coronal imaging, measuring 83 mm long axis.  The numbering convention used for this exam terms L5-S1 as the last full intervertebral disc space above the sacrum.  Spinal cord terminates posterior to the L1 vertebra.  There is a mild S-shaped curvature of the lumbar spine.  Dextroconvex apex at L4. Levoconvex apex at the thoracolumbar junction.  L1-L2:  Mild disc degeneration.  No stenosis.  L2-L3:  Negative.  L3-L4:  Negative.  L4-L5:  Disc desiccation with mild loss of height.  Small central disc protrusion is present with mild impression on the thecal sac. Mild narrowing of the right lateral recess without neural compression.  Central canal appears adequately patent.  Foramina patent.  Right facet mild right facet arthritis with small facet effusion.  L5-S1:  Disc desiccation.   Vacuum disc.  Small central disc protrusion minimally indenting the anterior thecal sac.  Lateral recesses and foramina appear patent.  IMPRESSION: 1.  Inflammatory changes or mass on the left side of the T12 vertebra extending along the T11-T12 paraspinal soft tissues.  This is incompletely visualized due to lumbar field of view.  Thoracic spine MRI with and without infusion recommended for further assessment. 2.  Mild lumbar spondylosis and S-shaped scoliosis.  No compressive stenosis. 3.  No superficial mass lesion is identified in the left flank/back soft tissues.  These results will be called  to the ordering clinician or representative by the Radiologist Assistant, and communication documented in the PACS Dashboard.  Original Report Authenticated By: Andreas Newport, M.D.     Disposition: home  Diet: BRAT, advancing as tolerate  Activity: as tolrated   Follow-up Appts: Discharge Orders    Future Appointments: Provider: Department: Dept Phone: Center:   07/13/2011 1:45 PM Coralyn Helling, MD [facility] [phone] None     Thoracic MRI per PCP   Time spent on discharge, talking to the patient, and coordinating care: 35 mins.   SignedMarlin Canary, DO 07/02/2011, 2:51 PM

## 2011-07-13 ENCOUNTER — Ambulatory Visit: Payer: BC Managed Care – PPO | Admitting: Pulmonary Disease

## 2011-07-22 ENCOUNTER — Other Ambulatory Visit: Payer: Self-pay | Admitting: Physician Assistant

## 2011-07-22 ENCOUNTER — Encounter (HOSPITAL_COMMUNITY): Payer: Self-pay

## 2011-07-22 ENCOUNTER — Ambulatory Visit (HOSPITAL_COMMUNITY)
Admission: RE | Admit: 2011-07-22 | Discharge: 2011-07-22 | Disposition: A | Discharge status: Home / Self Care | Payer: BC Managed Care – PPO | Source: Ambulatory Visit | Attending: Neurosurgery | Admitting: Neurosurgery

## 2011-07-22 ENCOUNTER — Other Ambulatory Visit (HOSPITAL_COMMUNITY): Payer: Self-pay | Admitting: Neurosurgery

## 2011-07-22 ENCOUNTER — Encounter (HOSPITAL_COMMUNITY): Payer: Self-pay | Admitting: Pharmacy Technician

## 2011-07-22 DIAGNOSIS — R222 Localized swelling, mass and lump, trunk: Secondary | ICD-10-CM

## 2011-07-22 DIAGNOSIS — I10 Essential (primary) hypertension: Secondary | ICD-10-CM | POA: Insufficient documentation

## 2011-07-22 DIAGNOSIS — E785 Hyperlipidemia, unspecified: Secondary | ICD-10-CM | POA: Insufficient documentation

## 2011-07-22 DIAGNOSIS — M549 Dorsalgia, unspecified: Secondary | ICD-10-CM | POA: Insufficient documentation

## 2011-07-22 DIAGNOSIS — G4730 Sleep apnea, unspecified: Secondary | ICD-10-CM | POA: Insufficient documentation

## 2011-07-22 LAB — APTT: aPTT: 39 seconds — ABNORMAL HIGH (ref 24–37)

## 2011-07-22 LAB — CBC
Platelets: 262 10*3/uL (ref 150–400)
RBC: 5.16 MIL/uL (ref 4.22–5.81)
RDW: 13.4 % (ref 11.5–15.5)
WBC: 10.2 10*3/uL (ref 4.0–10.5)

## 2011-07-22 LAB — PROTIME-INR: INR: 1.11 (ref 0.00–1.49)

## 2011-07-22 MED ORDER — HYDROMORPHONE HCL PF 1 MG/ML IJ SOLN
INTRAMUSCULAR | Status: AC
  Filled 2011-07-22: qty 3

## 2011-07-22 MED ORDER — HYDROMORPHONE HCL PF 1 MG/ML IJ SOLN
INTRAMUSCULAR | Status: DC | PRN
  Administered 2011-07-22: 2 mg

## 2011-07-22 MED ORDER — SODIUM CHLORIDE 0.9 % IV SOLN: INTRAVENOUS | Status: AC

## 2011-07-22 MED ORDER — SODIUM CHLORIDE 0.9 % IV SOLN: INTRAVENOUS | Status: DC

## 2011-07-22 MED ORDER — FENTANYL CITRATE 0.05 MG/ML IJ SOLN
INTRAMUSCULAR | Status: DC | PRN
  Administered 2011-07-22 (×2): 50 ug via INTRAVENOUS
  Administered 2011-07-22 (×2): 25 ug via INTRAVENOUS
  Administered 2011-07-22: 50 ug via INTRAVENOUS

## 2011-07-22 MED ORDER — MIDAZOLAM HCL 2 MG/2ML IJ SOLN
INTRAMUSCULAR | Status: AC
  Filled 2011-07-22: qty 4

## 2011-07-22 MED ORDER — HYDRALAZINE HCL 20 MG/ML IJ SOLN
INTRAMUSCULAR | Status: AC
  Filled 2011-07-22: qty 1

## 2011-07-22 MED ORDER — ONDANSETRON HCL 4 MG/2ML IJ SOLN
INTRAMUSCULAR | Status: AC
  Administered 2011-07-22: 4 mg
  Filled 2011-07-22: qty 2

## 2011-07-22 MED ORDER — MIDAZOLAM HCL 2 MG/2ML IJ SOLN
INTRAMUSCULAR | Status: AC
  Filled 2011-07-22: qty 6

## 2011-07-22 MED ORDER — FENTANYL CITRATE 0.05 MG/ML IJ SOLN
INTRAMUSCULAR | Status: AC
  Filled 2011-07-22: qty 6

## 2011-07-22 MED ORDER — CHLORHEXIDINE GLUCONATE 4 % EX LIQD
CUTANEOUS | Status: AC
  Filled 2011-07-22: qty 30

## 2011-07-22 MED ORDER — MIDAZOLAM HCL 5 MG/5ML IJ SOLN
INTRAMUSCULAR | Status: DC | PRN
  Administered 2011-07-22 (×6): 1 mg via INTRAVENOUS

## 2011-07-22 MED ORDER — ONDANSETRON HCL 4 MG/2ML IJ SOLN: 4.0000 mg | Freq: Once | INTRAMUSCULAR | Status: DC

## 2011-07-22 MED ORDER — FENTANYL CITRATE 0.05 MG/ML IJ SOLN
INTRAMUSCULAR | Status: AC
  Filled 2011-07-22: qty 4

## 2011-07-22 NOTE — Progress Notes (Signed)
N&V 100CC PARTIALLY DIGESTED FOOD AND DR NOTIFIED AND MED GIVEN AND CLIENT STATES MED HELPED NAUSEA

## 2011-07-22 NOTE — Procedures (Signed)
S/P left sided fluoro guided Paraspinous  Abscess drainage  At T 12. Approx 4 cc of blood tinged fluid obtained for microbiological testing.

## 2011-07-22 NOTE — H&P (Signed)
John Brandt is an 61 y.o. male.   Chief Complaint: low back pain  Onset 06/03/2011 MRI shows Thoracic #12 collection Scheduled now for aspiration  HPI: HTN; hyperlipidemia  Past Medical History  Diagnosis Date  . Hypertension   . Hyperlipidemia   . Sleep apnea   . Allergic rhinitis   . Hx of cardiac cath     Past Surgical History  Procedure Date  . Cardiac catheterization     Family History  Problem Relation Age of Onset  . Allergies Mother   . Heart disease Mother   . Lung cancer Mother     mesothelioma  . Aneurysm Brother     brain  . Heart disease Father   . Lung cancer Father    Social History:  reports that he has never smoked. He has never used smokeless tobacco. He reports that he drinks alcohol. He reports that he does not use illicit drugs.  Allergies:  Allergies  Allergen Reactions  . Iodine Rash  . Penicillins Rash     (Not in a hospital admission)  No results found for this or any previous visit (from the past 48 hour(s)). No results found.  Review of Systems  Constitutional: Negative for fever.  Cardiovascular: Negative for chest pain.  Gastrointestinal: Negative for nausea.  Musculoskeletal: Positive for back pain.  Neurological: Negative for headaches.  Psychiatric/Behavioral: The patient is nervous/anxious.     Blood pressure 148/97, pulse 71, temperature 97.1 F (36.2 C), temperature source Oral, resp. rate 18, height 6' (1.829 m), weight 283 lb (128.368 kg), SpO2 95.00%. Physical Exam  Constitutional: He is oriented to person, place, and time. He appears well-developed and well-nourished.  Cardiovascular: Normal rate, regular rhythm and normal heart sounds.   No murmur heard. Respiratory: Effort normal and breath sounds normal. He has no wheezes.  GI: Soft. Bowel sounds are normal. There is no tenderness.  Musculoskeletal: Normal range of motion.       Low back pain radiates to rt waistline  Neurological: He is alert and  oriented to person, place, and time.  Skin: Skin is warm and dry.  Psychiatric: He has a normal mood and affect. His behavior is normal. Judgment and thought content normal.     Assessment/Plan Back pian x 2 months MRI shows T12 lesion/collection Scheduled now for aspiration of collection Pt and wife aware of procedure benefits and risks and agreeable to proceed. Consent signed.  John Brandt A 07/22/2011, 2:19 PM

## 2011-07-25 ENCOUNTER — Telehealth (HOSPITAL_COMMUNITY): Payer: Self-pay | Admitting: *Deleted

## 2011-07-25 NOTE — Telephone Encounter (Signed)
Post procedure follow up call.  Spoke with pt, says doing much better.  No questions or concerns following

## 2011-07-26 LAB — BODY FLUID CULTURE: Culture: NO GROWTH

## 2011-07-27 LAB — ANAEROBIC CULTURE

## 2011-07-28 ENCOUNTER — Other Ambulatory Visit: Payer: Self-pay | Admitting: Neurosurgery

## 2011-08-01 NOTE — Pre-Procedure Instructions (Signed)
20 John Brandt  08/01/2011   Your procedure is scheduled on:  08-08-2011  Report to [facility] at 12:30  PM .  Call this number if you have problems the morning of surgery: [phone]   Remember:   Do not eat food or drink:After Midnight.  .  Take these medicines the morning of surgery with A SIP OF WATER: Tylenol as needed,amlodipine(Norvasc),Clonidine(Catapres),Valium as needed,Spironlactone(Aldactone),Nasacort nasal spray   Do not wear jewelry, make-up or nail polish.  Do not wear lotions, powders, or perfumes. You may wear deodorant.  Do not shave 48 hours prior to surgery. Men may shave face and neck.  Do not bring valuables to the hospital.  Contacts, dentures or bridgework may not be worn into surgery.  Leave suitcase in the car. After surgery it may be brought to your room.  For patients admitted to the hospital, checkout time is 11:00 AM the day of discharge.   Patients discharged the day of surgery will not be allowed to drive home.   Special Instructions: CHG Shower Use Special Wash: 1/2 bottle night before surgery and 1/2 bottle morning of surgery.   Please read over the following fact sheets that you were given: Pain Booklet, Coughing and Deep Breathing, MRSA Information and Surgical Site Infection Prevention

## 2011-08-02 ENCOUNTER — Ambulatory Visit (HOSPITAL_COMMUNITY)
Admission: RE | Admit: 2011-08-02 | Discharge: 2011-08-02 | Disposition: A | Discharge status: Home / Self Care | Payer: BC Managed Care – PPO | Source: Ambulatory Visit | Attending: Neurosurgery | Admitting: Neurosurgery

## 2011-08-02 ENCOUNTER — Encounter (HOSPITAL_COMMUNITY)
Admission: RE | Admit: 2011-08-02 | Discharge: 2011-08-02 | Disposition: A | Discharge status: Home / Self Care | Payer: BC Managed Care – PPO | Source: Ambulatory Visit | Attending: Neurosurgery | Admitting: Neurosurgery

## 2011-08-02 ENCOUNTER — Encounter (HOSPITAL_COMMUNITY): Payer: Self-pay

## 2011-08-02 DIAGNOSIS — Z01812 Encounter for preprocedural laboratory examination: Secondary | ICD-10-CM | POA: Insufficient documentation

## 2011-08-02 DIAGNOSIS — I517 Cardiomegaly: Secondary | ICD-10-CM | POA: Insufficient documentation

## 2011-08-02 DIAGNOSIS — Z01818 Encounter for other preprocedural examination: Secondary | ICD-10-CM | POA: Insufficient documentation

## 2011-08-02 HISTORY — DX: Other specified postprocedural states: Z98.890

## 2011-08-02 HISTORY — DX: Other specified postprocedural states: R11.2

## 2011-08-02 HISTORY — DX: Adverse effect of unspecified anesthetic, initial encounter: T41.45XA

## 2011-08-02 HISTORY — DX: Other complications of anesthesia, initial encounter: T88.59XA

## 2011-08-02 LAB — BASIC METABOLIC PANEL
BUN: 19 mg/dL (ref 6–23)
CO2: 29 mEq/L (ref 19–32)
Calcium: 9.5 mg/dL (ref 8.4–10.5)
Chloride: 102 mEq/L (ref 96–112)
Creatinine, Ser: 0.81 mg/dL (ref 0.50–1.35)
GFR calc Af Amer: >90 mL/min (ref >90)
GFR calc non Af Amer: >90 mL/min (ref >90)
Glucose, Bld: 117 mg/dL — ABNORMAL HIGH (ref 70–99)
Potassium: 4.3 mEq/L (ref 3.5–5.1)
Sodium: 138 mEq/L (ref 135–145)

## 2011-08-02 LAB — CBC
HCT: 40.4 % (ref 39.0–52.0)
Hemoglobin: 13.3 g/dL (ref 13.0–17.0)
MCHC: 32.9 g/dL (ref 30.0–36.0)
RBC: 4.82 MIL/uL (ref 4.22–5.81)
WBC: 14.9 10*3/uL — ABNORMAL HIGH (ref 4.0–10.5)

## 2011-08-02 LAB — SURGICAL PCR SCREEN
MRSA, PCR: NEGATIVE
Staphylococcus aureus: POSITIVE — AB

## 2011-08-04 DIAGNOSIS — M4620 Osteomyelitis of vertebra, site unspecified: Secondary | ICD-10-CM

## 2011-08-04 DIAGNOSIS — M4640 Discitis, unspecified, site unspecified: Secondary | ICD-10-CM

## 2011-08-04 HISTORY — DX: Discitis, unspecified, site unspecified: M46.40

## 2011-08-04 HISTORY — DX: Osteomyelitis of vertebra, site unspecified: M46.20

## 2011-08-05 NOTE — Progress Notes (Signed)
NOTIFIED PATIENT OF TIME CHANGE, INSTRUCTED TO ARRIVE 08/08/11 (MONDAY)  AT 1150 AM .

## 2011-08-07 MED ORDER — VANCOMYCIN HCL 500 MG IV SOLR
500.0000 mg | INTRAVENOUS | Status: DC
  Filled 2011-08-07: qty 500

## 2011-08-07 MED ORDER — VANCOMYCIN HCL 1000 MG IV SOLR
1500.0000 mg | INTRAVENOUS | Status: AC
  Administered 2011-08-08: 1500 mg via INTRAVENOUS
  Filled 2011-08-07: qty 1500

## 2011-08-08 ENCOUNTER — Ambulatory Visit (HOSPITAL_COMMUNITY): Payer: BC Managed Care – PPO | Admitting: Anesthesiology

## 2011-08-08 ENCOUNTER — Inpatient Hospital Stay (HOSPITAL_COMMUNITY)
Admission: RE | Admit: 2011-08-08 | Discharge: 2011-08-10 | DRG: 008 | Disposition: A | Discharge status: Home / Self Care | Payer: BC Managed Care – PPO | Source: Ambulatory Visit | Attending: Neurosurgery | Admitting: Neurosurgery

## 2011-08-08 ENCOUNTER — Encounter (HOSPITAL_COMMUNITY)
Admission: RE | Disposition: A | Discharge status: Home / Self Care | Payer: Self-pay | Source: Ambulatory Visit | Attending: Neurosurgery

## 2011-08-08 ENCOUNTER — Encounter (HOSPITAL_COMMUNITY): Payer: Self-pay | Admitting: Anesthesiology

## 2011-08-08 ENCOUNTER — Inpatient Hospital Stay (HOSPITAL_COMMUNITY): Payer: BC Managed Care – PPO

## 2011-08-08 ENCOUNTER — Ambulatory Visit (HOSPITAL_COMMUNITY): Payer: BC Managed Care – PPO

## 2011-08-08 ENCOUNTER — Encounter (HOSPITAL_COMMUNITY): Payer: Self-pay | Admitting: *Deleted

## 2011-08-08 DIAGNOSIS — G4733 Obstructive sleep apnea (adult) (pediatric): Secondary | ICD-10-CM | POA: Diagnosis present

## 2011-08-08 DIAGNOSIS — Z79899 Other long term (current) drug therapy: Secondary | ICD-10-CM

## 2011-08-08 DIAGNOSIS — Z7982 Long term (current) use of aspirin: Secondary | ICD-10-CM

## 2011-08-08 DIAGNOSIS — R222 Localized swelling, mass and lump, trunk: Secondary | ICD-10-CM | POA: Diagnosis present

## 2011-08-08 DIAGNOSIS — R29818 Other symptoms and signs involving the nervous system: Principal | ICD-10-CM | POA: Diagnosis present

## 2011-08-08 DIAGNOSIS — E785 Hyperlipidemia, unspecified: Secondary | ICD-10-CM | POA: Diagnosis present

## 2011-08-08 DIAGNOSIS — I10 Essential (primary) hypertension: Secondary | ICD-10-CM | POA: Diagnosis present

## 2011-08-08 HISTORY — PX: OTHER SURGICAL HISTORY: SHX169

## 2011-08-08 LAB — DIFFERENTIAL
Basophils Absolute: 0 10*3/uL (ref 0.0–0.1)
Lymphocytes Relative: 12 % (ref 12–46)
Monocytes Absolute: 1.2 10*3/uL — ABNORMAL HIGH (ref 0.1–1.0)
Neutro Abs: 11.8 10*3/uL — ABNORMAL HIGH (ref 1.7–7.7)

## 2011-08-08 LAB — TYPE AND SCREEN
ABO/RH(D): O NEG
Antibody Screen: NEGATIVE

## 2011-08-08 LAB — ABO/RH: ABO/RH(D): O NEG

## 2011-08-08 SURGERY — LUMBAR LAMINECTOMY FOR EPIDURAL ABSCESS
Anesthesia: General | Site: Back | Wound class: Dirty or Infected

## 2011-08-08 MED ORDER — ATORVASTATIN CALCIUM 10 MG PO TABS
10.0000 mg | ORAL_TABLET | Freq: Every day | ORAL | Status: DC
  Administered 2011-08-08 – 2011-08-09 (×2): 10 mg via ORAL
  Filled 2011-08-08 (×4): qty 1

## 2011-08-08 MED ORDER — GLYCOPYRROLATE 0.2 MG/ML IJ SOLN
INTRAMUSCULAR | Status: DC | PRN
  Administered 2011-08-08: .7 mg via INTRAVENOUS

## 2011-08-08 MED ORDER — FENTANYL CITRATE 0.05 MG/ML IJ SOLN
INTRAMUSCULAR | Status: DC | PRN
  Administered 2011-08-08 (×2): 50 ug via INTRAVENOUS
  Administered 2011-08-08: 100 ug via INTRAVENOUS
  Administered 2011-08-08: 50 ug via INTRAVENOUS

## 2011-08-08 MED ORDER — METOPROLOL SUCCINATE ER 50 MG PO TB24
50.0000 mg | ORAL_TABLET | Freq: Every day | ORAL | Status: DC
  Administered 2011-08-09 – 2011-08-10 (×2): 50 mg via ORAL
  Filled 2011-08-08 (×2): qty 1

## 2011-08-08 MED ORDER — ROCURONIUM BROMIDE 100 MG/10ML IV SOLN
INTRAVENOUS | Status: DC | PRN
  Administered 2011-08-08: 10 mg via INTRAVENOUS
  Administered 2011-08-08: 30 mg via INTRAVENOUS
  Administered 2011-08-08 (×2): 10 mg via INTRAVENOUS

## 2011-08-08 MED ORDER — THROMBIN 5000 UNITS EX KIT
PACK | CUTANEOUS | Status: DC | PRN
  Administered 2011-08-08 (×2): 5000 [IU] via TOPICAL

## 2011-08-08 MED ORDER — ACETAMINOPHEN 325 MG PO TABS: 650.0000 mg | ORAL_TABLET | ORAL | Status: DC | PRN

## 2011-08-08 MED ORDER — SODIUM CHLORIDE 0.9 % IV SOLN: 250.0000 mL | INTRAVENOUS | Status: DC

## 2011-08-08 MED ORDER — MENTHOL 3 MG MT LOZG: 1.0000 | LOZENGE | OROMUCOSAL | Status: DC | PRN

## 2011-08-08 MED ORDER — SUCCINYLCHOLINE CHLORIDE 20 MG/ML IJ SOLN
INTRAMUSCULAR | Status: DC | PRN
  Administered 2011-08-08: 140 mg via INTRAVENOUS

## 2011-08-08 MED ORDER — SODIUM CHLORIDE 0.9 % IV SOLN
INTRAVENOUS | Status: AC
  Filled 2011-08-08: qty 500

## 2011-08-08 MED ORDER — CIPROFLOXACIN IN D5W 400 MG/200ML IV SOLN
400.0000 mg | Freq: Two times a day (BID) | INTRAVENOUS | Status: DC
  Administered 2011-08-08 – 2011-08-09 (×2): 400 mg via INTRAVENOUS
  Filled 2011-08-08 (×3): qty 200

## 2011-08-08 MED ORDER — ACETAMINOPHEN 650 MG RE SUPP: 650.0000 mg | RECTAL | Status: DC | PRN

## 2011-08-08 MED ORDER — CLONIDINE HCL 0.2 MG PO TABS
0.2000 mg | ORAL_TABLET | Freq: Three times a day (TID) | ORAL | Status: DC
  Administered 2011-08-08 – 2011-08-10 (×5): 0.2 mg via ORAL
  Filled 2011-08-08 (×7): qty 1

## 2011-08-08 MED ORDER — OXYCODONE-ACETAMINOPHEN 5-325 MG PO TABS: 1.0000 | ORAL_TABLET | ORAL | Status: DC | PRN

## 2011-08-08 MED ORDER — LACTATED RINGERS IV SOLN
INTRAVENOUS | Status: DC | PRN
  Administered 2011-08-08 (×2): via INTRAVENOUS

## 2011-08-08 MED ORDER — DIAZEPAM 5 MG PO TABS
5.0000 mg | ORAL_TABLET | Freq: Four times a day (QID) | ORAL | Status: DC | PRN
  Administered 2011-08-09 – 2011-08-10 (×2): 5 mg via ORAL
  Filled 2011-08-08 (×2): qty 1

## 2011-08-08 MED ORDER — SODIUM CHLORIDE 0.9 % IR SOLN
Status: DC | PRN
  Administered 2011-08-08: 16:00:00

## 2011-08-08 MED ORDER — SODIUM CHLORIDE 0.9 % IJ SOLN: 3.0000 mL | INTRAMUSCULAR | Status: DC | PRN

## 2011-08-08 MED ORDER — 0.9 % SODIUM CHLORIDE (POUR BTL) OPTIME
TOPICAL | Status: DC | PRN
  Administered 2011-08-08: 1000 mL

## 2011-08-08 MED ORDER — CYCLOBENZAPRINE HCL 10 MG PO TABS: 10.0000 mg | ORAL_TABLET | Freq: Three times a day (TID) | ORAL | Status: DC | PRN

## 2011-08-08 MED ORDER — ASPIRIN EC 81 MG PO TBEC
81.0000 mg | DELAYED_RELEASE_TABLET | Freq: Every day | ORAL | Status: DC
  Administered 2011-08-08 – 2011-08-10 (×3): 81 mg via ORAL
  Filled 2011-08-08 (×3): qty 1

## 2011-08-08 MED ORDER — EPHEDRINE SULFATE 50 MG/ML IJ SOLN
INTRAMUSCULAR | Status: DC | PRN
  Administered 2011-08-08 (×2): 10 mg via INTRAVENOUS

## 2011-08-08 MED ORDER — ZOLPIDEM TARTRATE 5 MG PO TABS: 5.0000 mg | ORAL_TABLET | Freq: Every evening | ORAL | Status: DC | PRN

## 2011-08-08 MED ORDER — PHENOL 1.4 % MT LIQD: 1.0000 | OROMUCOSAL | Status: DC | PRN

## 2011-08-08 MED ORDER — ONE-DAILY MULTI VITAMINS PO TABS: 1.0000 | ORAL_TABLET | Freq: Every day | ORAL | Status: DC

## 2011-08-08 MED ORDER — PROPOFOL 10 MG/ML IV EMUL
INTRAVENOUS | Status: DC | PRN
  Administered 2011-08-08: 280 mg via INTRAVENOUS

## 2011-08-08 MED ORDER — POLYETHYLENE GLYCOL 3350 17 G PO PACK
17.0000 g | PACK | Freq: Every day | ORAL | Status: DC
  Administered 2011-08-08 – 2011-08-10 (×3): 17 g via ORAL
  Filled 2011-08-08 (×3): qty 1

## 2011-08-08 MED ORDER — MIDAZOLAM HCL 5 MG/5ML IJ SOLN
INTRAMUSCULAR | Status: DC | PRN
  Administered 2011-08-08: 1 mg via INTRAVENOUS

## 2011-08-08 MED ORDER — POLYETHYLENE GLYCOL 3350 17 G PO PACK
17.0000 g | PACK | Freq: Every day | ORAL | Status: DC | PRN
  Filled 2011-08-08: qty 1

## 2011-08-08 MED ORDER — BACITRACIN 50000 UNITS IM SOLR
INTRAMUSCULAR | Status: AC
  Filled 2011-08-08: qty 1

## 2011-08-08 MED ORDER — HYDROMORPHONE HCL PF 1 MG/ML IJ SOLN
0.2500 mg | INTRAMUSCULAR | Status: DC | PRN
  Administered 2011-08-08 (×4): 0.5 mg via INTRAVENOUS

## 2011-08-08 MED ORDER — ONDANSETRON HCL 4 MG/2ML IJ SOLN
INTRAMUSCULAR | Status: DC | PRN
  Administered 2011-08-08: 4 mg via INTRAVENOUS

## 2011-08-08 MED ORDER — PHENYLEPHRINE HCL 10 MG/ML IJ SOLN
INTRAMUSCULAR | Status: DC | PRN
  Administered 2011-08-08 (×2): 80 ug via INTRAVENOUS

## 2011-08-08 MED ORDER — BISACODYL 10 MG RE SUPP: 10.0000 mg | Freq: Every day | RECTAL | Status: DC | PRN

## 2011-08-08 MED ORDER — DEXAMETHASONE SODIUM PHOSPHATE 10 MG/ML IJ SOLN
INTRAMUSCULAR | Status: AC
  Filled 2011-08-08: qty 1

## 2011-08-08 MED ORDER — HYDROMORPHONE HCL PF 1 MG/ML IJ SOLN
INTRAMUSCULAR | Status: AC
  Filled 2011-08-08: qty 1

## 2011-08-08 MED ORDER — LIDOCAINE HCL (CARDIAC) 20 MG/ML IV SOLN
INTRAVENOUS | Status: DC | PRN
  Administered 2011-08-08: 80 mg via INTRAVENOUS

## 2011-08-08 MED ORDER — AMLODIPINE BESYLATE 10 MG PO TABS
10.0000 mg | ORAL_TABLET | Freq: Every day | ORAL | Status: DC
  Administered 2011-08-09 – 2011-08-10 (×2): 10 mg via ORAL
  Filled 2011-08-08 (×2): qty 1

## 2011-08-08 MED ORDER — HYDROCODONE-ACETAMINOPHEN 5-325 MG PO TABS
1.0000 | ORAL_TABLET | ORAL | Status: DC | PRN
  Administered 2011-08-09 – 2011-08-10 (×3): 2 via ORAL
  Filled 2011-08-08 (×4): qty 2

## 2011-08-08 MED ORDER — BUPIVACAINE HCL (PF) 0.25 % IJ SOLN
INTRAMUSCULAR | Status: DC | PRN
  Administered 2011-08-08: 30 mL

## 2011-08-08 MED ORDER — ALUM & MAG HYDROXIDE-SIMETH 200-200-20 MG/5ML PO SUSP: 30.0000 mL | Freq: Four times a day (QID) | ORAL | Status: DC | PRN

## 2011-08-08 MED ORDER — KETOROLAC TROMETHAMINE 30 MG/ML IJ SOLN
30.0000 mg | Freq: Four times a day (QID) | INTRAMUSCULAR | Status: DC
  Administered 2011-08-08 – 2011-08-10 (×7): 30 mg via INTRAVENOUS
  Filled 2011-08-08 (×12): qty 1

## 2011-08-08 MED ORDER — FLUTICASONE PROPIONATE 50 MCG/ACT NA SUSP
2.0000 | Freq: Every day | NASAL | Status: DC
  Administered 2011-08-09 – 2011-08-10 (×2): 2 via NASAL
  Filled 2011-08-08: qty 16

## 2011-08-08 MED ORDER — ASPIRIN 81 MG PO TABS: 81.0000 mg | ORAL_TABLET | Freq: Every day | ORAL | Status: DC

## 2011-08-08 MED ORDER — FENTANYL 25 MCG/HR TD PT72: 100.0000 ug | MEDICATED_PATCH | TRANSDERMAL | Status: DC

## 2011-08-08 MED ORDER — HYDROMORPHONE HCL PF 1 MG/ML IJ SOLN
0.5000 mg | INTRAMUSCULAR | Status: DC | PRN
  Administered 2011-08-08 (×2): 1 mg via INTRAVENOUS
  Filled 2011-08-08 (×2): qty 1

## 2011-08-08 MED ORDER — NEOSTIGMINE METHYLSULFATE 1 MG/ML IJ SOLN
INTRAMUSCULAR | Status: DC | PRN
  Administered 2011-08-08: 5 mg via INTRAVENOUS

## 2011-08-08 MED ORDER — ONDANSETRON HCL 4 MG/2ML IJ SOLN
4.0000 mg | INTRAMUSCULAR | Status: DC | PRN
  Administered 2011-08-08: 4 mg via INTRAVENOUS
  Filled 2011-08-08: qty 2

## 2011-08-08 MED ORDER — ADULT MULTIVITAMIN W/MINERALS CH
1.0000 | ORAL_TABLET | Freq: Every day | ORAL | Status: DC
  Administered 2011-08-09 – 2011-08-10 (×2): 1 via ORAL
  Filled 2011-08-08 (×2): qty 1

## 2011-08-08 MED ORDER — FLEET ENEMA 7-19 GM/118ML RE ENEM: 1.0000 | ENEMA | Freq: Once | RECTAL | Status: AC | PRN

## 2011-08-08 MED ORDER — HEMOSTATIC AGENTS (NO CHARGE) OPTIME
TOPICAL | Status: DC | PRN
  Administered 2011-08-08: 1 via TOPICAL

## 2011-08-08 MED ORDER — DEXAMETHASONE SODIUM PHOSPHATE 10 MG/ML IJ SOLN: 10.0000 mg | INTRAMUSCULAR | Status: DC

## 2011-08-08 MED ORDER — KETOROLAC TROMETHAMINE 30 MG/ML IJ SOLN
INTRAMUSCULAR | Status: DC | PRN
  Administered 2011-08-08: 30 mg via INTRAVENOUS

## 2011-08-08 MED ORDER — VANCOMYCIN HCL 1000 MG IV SOLR
1250.0000 mg | Freq: Two times a day (BID) | INTRAVENOUS | Status: DC
  Administered 2011-08-08 – 2011-08-10 (×4): 1250 mg via INTRAVENOUS
  Filled 2011-08-08 (×5): qty 1250

## 2011-08-08 MED ORDER — SENNA 8.6 MG PO TABS
1.0000 | ORAL_TABLET | Freq: Two times a day (BID) | ORAL | Status: DC
  Administered 2011-08-08 – 2011-08-10 (×4): 8.6 mg via ORAL
  Filled 2011-08-08 (×5): qty 1

## 2011-08-08 MED ORDER — SODIUM CHLORIDE 0.9 % IJ SOLN
3.0000 mL | Freq: Two times a day (BID) | INTRAMUSCULAR | Status: DC
  Administered 2011-08-08 – 2011-08-10 (×3): 3 mL via INTRAVENOUS

## 2011-08-08 MED ORDER — POTASSIUM CHLORIDE CRYS ER 20 MEQ PO TBCR
40.0000 meq | EXTENDED_RELEASE_TABLET | Freq: Three times a day (TID) | ORAL | Status: DC
  Administered 2011-08-08 – 2011-08-10 (×5): 40 meq via ORAL
  Filled 2011-08-08 (×7): qty 2

## 2011-08-08 MED ORDER — HYDROMORPHONE HCL PF 1 MG/ML IJ SOLN
INTRAMUSCULAR | Status: AC
  Administered 2011-08-08: 0.5 mg
  Filled 2011-08-08: qty 1

## 2011-08-08 SURGICAL SUPPLY — 54 items
ADH SKN CLS APL DERMABOND .7 (GAUZE/BANDAGES/DRESSINGS) ×1
APL SKNCLS STERI-STRIP NONHPOA (GAUZE/BANDAGES/DRESSINGS) ×1
BAG DECANTER FOR FLEXI CONT (MISCELLANEOUS) ×2 IMPLANT
BENZOIN TINCTURE PRP APPL 2/3 (GAUZE/BANDAGES/DRESSINGS) ×2 IMPLANT
BLADE SURG ROTATE 9660 (MISCELLANEOUS) IMPLANT
BRUSH SCRUB EZ PLAIN DRY (MISCELLANEOUS) ×2 IMPLANT
BUR CUTTER 7.0 ROUND (BURR) ×2 IMPLANT
CANISTER SUCTION 2500CC (MISCELLANEOUS) ×2 IMPLANT
CLOTH BEACON ORANGE TIMEOUT ST (SAFETY) ×2 IMPLANT
CONT SPEC 4OZ CLIKSEAL STRL BL (MISCELLANEOUS) ×2 IMPLANT
DECANTER SPIKE VIAL GLASS SM (MISCELLANEOUS) ×2 IMPLANT
DERMABOND ADVANCED (GAUZE/BANDAGES/DRESSINGS) ×1
DERMABOND ADVANCED .7 DNX12 (GAUZE/BANDAGES/DRESSINGS) ×1 IMPLANT
DRAPE LAPAROTOMY 100X72X124 (DRAPES) ×2 IMPLANT
DRAPE MICROSCOPE LEICA (MISCELLANEOUS) ×1 IMPLANT
DRAPE MICROSCOPE ZEISS OPMI (DRAPES) ×2 IMPLANT
DRAPE POUCH INSTRU U-SHP 10X18 (DRAPES) ×2 IMPLANT
DRAPE PROXIMA HALF (DRAPES) IMPLANT
DRAPE SURG 17X23 STRL (DRAPES) ×4 IMPLANT
ELECT REM PT RETURN 9FT ADLT (ELECTROSURGICAL) ×2
ELECTRODE REM PT RTRN 9FT ADLT (ELECTROSURGICAL) ×1 IMPLANT
EVACUATOR 1/8 PVC DRAIN (DRAIN) ×1 IMPLANT
GAUZE SPONGE 4X4 16PLY XRAY LF (GAUZE/BANDAGES/DRESSINGS) IMPLANT
GLOVE BIOGEL PI IND STRL 8.5 (GLOVE) IMPLANT
GLOVE BIOGEL PI INDICATOR 8.5 (GLOVE) ×1
GLOVE ECLIPSE 7.5 STRL STRAW (GLOVE) ×3 IMPLANT
GLOVE ECLIPSE 8.5 STRL (GLOVE) ×3 IMPLANT
GLOVE EXAM NITRILE LRG STRL (GLOVE) IMPLANT
GLOVE EXAM NITRILE MD LF STRL (GLOVE) ×1 IMPLANT
GLOVE EXAM NITRILE XL STR (GLOVE) IMPLANT
GLOVE EXAM NITRILE XS STR PU (GLOVE) IMPLANT
GLOVE INDICATOR 7.5 STRL GRN (GLOVE) ×1 IMPLANT
GOWN BRE IMP SLV AUR LG STRL (GOWN DISPOSABLE) IMPLANT
GOWN BRE IMP SLV AUR XL STRL (GOWN DISPOSABLE) ×3 IMPLANT
GOWN STRL REIN 2XL LVL4 (GOWN DISPOSABLE) ×2 IMPLANT
KIT BASIN OR (CUSTOM PROCEDURE TRAY) ×2 IMPLANT
KIT ROOM TURNOVER OR (KITS) ×2 IMPLANT
NEEDLE HYPO 22GX1.5 SAFETY (NEEDLE) ×2 IMPLANT
NS IRRIG 1000ML POUR BTL (IV SOLUTION) ×2 IMPLANT
PACK LAMINECTOMY NEURO (CUSTOM PROCEDURE TRAY) ×2 IMPLANT
PAD ARMBOARD 7.5X6 YLW CONV (MISCELLANEOUS) ×6 IMPLANT
RUBBERBAND STERILE (MISCELLANEOUS) ×5 IMPLANT
SPONGE GAUZE 4X4 12PLY (GAUZE/BANDAGES/DRESSINGS) ×2 IMPLANT
SPONGE SURGIFOAM ABS GEL SZ50 (HEMOSTASIS) ×2 IMPLANT
STRIP CLOSURE SKIN 1/2X4 (GAUZE/BANDAGES/DRESSINGS) ×2 IMPLANT
SUT VIC AB 2-0 CT1 18 (SUTURE) ×2 IMPLANT
SUT VIC AB 3-0 SH 8-18 (SUTURE) ×2 IMPLANT
SWAB COLLECTION DEVICE MRSA (MISCELLANEOUS) IMPLANT
SYR 20ML ECCENTRIC (SYRINGE) ×2 IMPLANT
TAPE CLOTH SURG 4X10 WHT LF (GAUZE/BANDAGES/DRESSINGS) ×1 IMPLANT
TOWEL OR 17X24 6PK STRL BLUE (TOWEL DISPOSABLE) ×2 IMPLANT
TOWEL OR 17X26 10 PK STRL BLUE (TOWEL DISPOSABLE) ×2 IMPLANT
TUBE ANAEROBIC SPECIMEN COL (MISCELLANEOUS) IMPLANT
WATER STERILE IRR 1000ML POUR (IV SOLUTION) ×2 IMPLANT

## 2011-08-08 NOTE — H&P (Signed)
John Brandt is an 61 y.o. male.   Chief Complaint: Back pain HPI: 61 year old male with an enlarging T12 paraspinal fluid collection. Aspiration was negative for infection. Cytology nonconclusive. Patient presents now for open exploration for diagnosis. He has no radiating pain. No motor or sensory dysfunction. There is no evidence of significant bony disruption. There's nothing threading the spinal canal at present.  Past Medical History  Diagnosis Date  . Hypertension   . Hyperlipidemia   . Allergic rhinitis   . Hx of cardiac cath   . Complication of anesthesia   . PONV (postoperative nausea and vomiting)   . Sleep apnea     sleep study 07-02-02    Past Surgical History  Procedure Date  . Vasectomy   . Dental implants   . Cardiac catheterization     more than 15 yrs ago    Family History  Problem Relation Age of Onset  . Allergies Mother   . Heart disease Mother   . Lung cancer Mother     mesothelioma  . Aneurysm Brother     brain  . Heart disease Father   . Lung cancer Father    Social History:  reports that he has never smoked. He has never used smokeless tobacco. He reports that he drinks alcohol. He reports that he does not use illicit drugs.  Allergies:  Allergies  Allergen Reactions  . Fish-Derived Products Nausea And Vomiting  . Iodine Rash  . Penicillins Rash    Medications Prior to Admission  Medication Sig Dispense Refill  . acetaminophen (TYLENOL) 500 MG tablet Take 1,000 mg by mouth every 6 (six) hours as needed. For pain      . amLODipine (NORVASC) 10 MG tablet 10 mg. 1 by mouth daily      . aspirin 81 MG tablet Take 81 mg by mouth daily.      Marland Kitchen atorvastatin (LIPITOR) 10 MG tablet Take 10 mg by mouth daily.       . cloNIDine (CATAPRES) 0.2 MG tablet Take 1 tablet (0.2 mg total) by mouth 3 (three) times daily. Hold unless BP > 140 when checked      . diazepam (VALIUM) 5 MG tablet Take 5-10 mg by mouth every 6 (six) hours as needed. As needed  for pain or sleep      . fentaNYL (DURAGESIC - DOSED MCG/HR) 100 MCG/HR Place 1 patch onto the skin every other day.      Marland Kitchen KLOR-CON M20 20 MEQ tablet Take 40 mEq by mouth 3 (three) times daily. 2 tablets three times dailt      . methylPREDNISolone (MEDROL DOSEPAK) 4 MG tablet Take 4 mg by mouth. follow package directions. First dose on 07/28/11      . metoprolol succinate (TOPROL-XL) 50 MG 24 hr tablet Take 50 mg by mouth daily. Take with or immediately following a meal.      . Multiple Vitamin (MULTIVITAMIN) tablet Take 1 tablet by mouth daily.      . polyethylene glycol (MIRALAX / GLYCOLAX) packet Take 17 g by mouth daily.      Marland Kitchen spironolactone (ALDACTONE) 25 MG tablet Take 25 mg by mouth daily. 1 by mouth daily      . triamcinolone (NASACORT) 55 MCG/ACT nasal inhaler Place 2 sprays into the nose daily. 2 sprays in each nostril daily      . Vitamin D, Ergocalciferol, (DRISDOL) 50000 UNITS CAPS 50,000 Units. 1 tablet weekly  Results for orders placed during the hospital encounter of 08/08/11 (from the past 48 hour(s))  TYPE AND SCREEN     Status: Normal   Collection Time   08/08/11 11:55 AM      Component Value Range Comment   ABO/RH(D) O NEG      Antibody Screen NEG      Sample Expiration 08/11/2011     ABO/RH     Status: Normal   Collection Time   08/08/11 11:55 AM      Component Value Range Comment   ABO/RH(D) O NEG     DIFFERENTIAL     Status: Abnormal   Collection Time   08/08/11 12:00 PM      Component Value Range Comment   Neutrophils Relative 78 (*) 43 - 77 %    Neutro Abs 11.8 (*) 1.7 - 7.7 K/uL    Lymphocytes Relative 12  12 - 46 %    Lymphs Abs 1.7  0.7 - 4.0 K/uL    Monocytes Relative 8  3 - 12 %    Monocytes Absolute 1.2 (*) 0.1 - 1.0 K/uL    Eosinophils Relative 2  0 - 5 %    Eosinophils Absolute 0.3  0.0 - 0.7 K/uL    Basophils Relative 0  0 - 1 %    Basophils Absolute 0.0  0.0 - 0.1 K/uL    No results found.  Review of Systems  Constitutional: Negative.     HENT: Negative.   Eyes: Negative.   Respiratory: Negative.   Cardiovascular: Negative.   Gastrointestinal: Negative.   Genitourinary: Negative.   Musculoskeletal: Negative.   Skin: Negative.   Neurological: Negative.   Endo/Heme/Allergies: Negative.   Psychiatric/Behavioral: Negative.     Blood pressure 159/87, pulse 47, temperature 97.5 F (36.4 C), temperature source Oral, resp. rate 20, SpO2 99.00%. Physical Exam  Constitutional: He is oriented to person, place, and time. He appears well-developed and well-nourished. No distress.  HENT:  Head: Normocephalic and atraumatic.  Right Ear: External ear normal.  Left Ear: External ear normal.  Nose: Nose normal.  Mouth/Throat: Oropharynx is clear and moist. No oropharyngeal exudate.  Eyes: Conjunctivae and EOM are normal. Pupils are equal, round, and reactive to light. Right eye exhibits no discharge. Left eye exhibits no discharge. No scleral icterus.  Neck: Normal range of motion. Neck supple. No tracheal deviation present. No thyromegaly present.  Cardiovascular: Normal rate, regular rhythm and intact distal pulses.  Exam reveals no friction rub.   No murmur heard. Respiratory: Effort normal and breath sounds normal. No respiratory distress. He has no wheezes. He has no rales.  GI: Soft. Bowel sounds are normal. He exhibits no distension. There is no tenderness.  Musculoskeletal: Normal range of motion. He exhibits tenderness. He exhibits no edema.  Neurological: He is alert and oriented to person, place, and time. He has normal reflexes. No cranial nerve deficit. Coordination normal.  Skin: Skin is warm and dry. No rash noted. He is not diaphoretic. No erythema. No pallor.  Psychiatric: He has a normal mood and affect. His behavior is normal. Judgment and thought content normal.     Assessment/Plan T12 paraspinal fluid collection etiology unknown question neoplastic disease versus atypical infection. Plan left T12  extraforaminal exploration with open biopsy/aspiration. Risks and benefits have been explained. Patient wishes to proceed.  John Brandt A 08/08/2011, 2:30 PM

## 2011-08-08 NOTE — Progress Notes (Signed)
ANTIBIOTIC CONSULT NOTE - INITIAL  Pharmacy Consult for vancomycin Indication:  vertebral fluid collection, ? Abscess vs neoplasm  Allergies  Allergen Reactions  . Fish-Derived Products Nausea And Vomiting  . Iodine Rash  . Penicillins Rash    Patient Measurements: Height: 6' 0.83" (185 cm) Weight: 292 lb 5.3 oz (132.6 kg) IBW/kg (Calculated) : 79.52  Adjusted Body Weight:  Vital Signs: Temp: 97.6 F (36.4 C) (08/05 1833) Temp src: Oral (08/05 1833) BP: 154/76 mmHg (08/05 1833) Pulse Rate: 51  (08/05 1833) Intake/Output from previous day:   Intake/Output from this shift:    Labs: No results found for this basename: WBC:3,HGB:3,PLT:3,LABCREA:3,CREATININE:3 in the last 72 hours Estimated Creatinine Clearance: 134.7 ml/min (by C-G formula based on Cr of 0.81). No results found for this basename: VANCOTROUGH:2,VANCOPEAK:2,VANCORANDOM:2,GENTTROUGH:2,GENTPEAK:2,GENTRANDOM:2,TOBRATROUGH:2,TOBRAPEAK:2,TOBRARND:2,AMIKACINPEAK:2,AMIKACINTROU:2,AMIKACIN:2, in the last 72 hours   Microbiology: Recent Results (from the past 720 hour(s))  BODY FLUID CULTURE     Status: Normal   Collection Time   07/22/11  3:43 PM      Component Value Range Status Comment   Specimen Description FLUID   Final    Special Requests PARASPINAL   Final    Gram Stain     Final    Value: NO WBC SEEN     NO ORGANISMS SEEN   Culture NO GROWTH 3 DAYS   Final    Report Status 07/26/2011 FINAL   Final   ANAEROBIC CULTURE     Status: Normal   Collection Time   07/22/11  3:43 PM      Component Value Range Status Comment   Specimen Description FLUID   Final    Special Requests PARASPINAL   Final    Gram Stain     Final    Value: NO WBC SEEN     NO ORGANISMS SEEN   Culture NO ANAEROBES ISOLATED   Final    Report Status 07/27/2011 FINAL   Final   SURGICAL PCR SCREEN     Status: Abnormal   Collection Time   08/02/11  8:37 AM      Component Value Range Status Comment   MRSA, PCR NEGATIVE  NEGATIVE Final    Staphylococcus aureus POSITIVE (*) NEGATIVE Final     Medical History: Past Medical History  Diagnosis Date  . Hypertension   . Hyperlipidemia   . Allergic rhinitis   . Hx of cardiac cath   . Complication of anesthesia   . PONV (postoperative nausea and vomiting)   . Sleep apnea     sleep study 07-02-02    Medications:  Scheduled:    . amLODipine  10 mg Oral Daily  . aspirin EC  81 mg Oral Daily  . atorvastatin  10 mg Oral Q2000  . bacitracin      . ciprofloxacin  400 mg Intravenous Q12H  . cloNIDine  0.2 mg Oral TID  . dexamethasone      . fentaNYL  100 mcg Transdermal Q48H  . fluticasone  2 spray Each Nare Daily  . HYDROmorphone      . HYDROmorphone      . ketorolac  30 mg Intravenous Q6H  . metoprolol succinate  50 mg Oral Daily  . multivitamin with minerals  1 tablet Oral Daily  . polyethylene glycol  17 g Oral Daily  . potassium chloride SA  40 mEq Oral TID  . senna  1 tablet Oral BID  . sodium chloride      . sodium chloride  3 mL  Intravenous Q12H  . vancomycin  1,500 mg Intravenous 120 min pre-op  . DISCONTD: aspirin  81 mg Oral Daily  . DISCONTD: dexamethasone  10 mg Intravenous To OR  . DISCONTD: multivitamin  1 tablet Oral Daily   Assessment: 60 YOM with T12 paraspinal mass, possible infection vs tumor. Given vancomycin 1500mg  IV pre-op at 16:00. Orders to resume vanco post-op x 1 or until drain out.  Due to concern of infection, continue vancomycin until infection ruled out.  Renal fx WNL, WBC sl elevated.   Vancomycin 8/5 >> cipro 8/5 >>  8/5: intra-op cx  Goal of Therapy:  Vancomycin trough level 15-20 mcg/ml  Plan:  1. Vancomycin 1250mg  IV q12h, give 1st dose 8hr after pre-op dose 2. Follow renal function and check Css trough level 3. F/u culture results  Dannielle Huh 08/08/2011,7:09 PM

## 2011-08-08 NOTE — Progress Notes (Signed)
Spoke with Shonna Chock PA about EKG from 07/02/11. Shonna Chock PA did pull up EKG to look at it. Patient states he does not think he has had EKG done since around the time he had his heart cath approx 15 years ago.  Revonda Standard asked me to call anesthesiologist, called Dr. Doretha Sou to let her know about EKG, stated she would see patient in [place]. Called Johney Frame PA back to let her know this.

## 2011-08-08 NOTE — Brief Op Note (Signed)
08/08/2011  4:37 PM  PATIENT:  John Brandt  61 y.o. male  PRE-OPERATIVE DIAGNOSIS:  Thoracic twelve paravertebral abscess  POST-OPERATIVE DIAGNOSIS:  Thoracic twelve paravertebral abscess  PROCEDURE:  Procedure(s) (LRB): LUMBAR LAMINECTOMY FOR EPIDURAL ABSCESS (N/A)  SURGEON:  Surgeon(s) and Role:    * Temple Pacini, MD - Primary    * Barnett Abu, MD - Assisting  PHYSICIAN ASSISTANT:   ASSISTANTS:    ANESTHESIA:   general  EBL:  Total I/O In: 1400 [I.V.:1400] Out: 100 [Blood:100]  BLOOD ADMINISTERED:none  DRAINS: (Medium) Hemovact drain(s) in the Paraspinal space with  Suction Open   LOCAL MEDICATIONS USED:  MARCAINE     SPECIMEN:  Biopsy / Limited Resection  DISPOSITION OF SPECIMEN:  PATHOLOGY  COUNTS:  YES  TOURNIQUET:  * No tourniquets in log *  DICTATION: .Dragon Dictation  PLAN OF CARE: Admit to inpatient   PATIENT DISPOSITION:  PACU - hemodynamically stable.   Delay start of Pharmacological VTE agent (>24hrs) due to surgical blood loss or risk of bleeding: yes

## 2011-08-08 NOTE — Transfer of Care (Signed)
Immediate Anesthesia Transfer of Care Note  Patient: John Brandt  Procedure(s) Performed: Procedure(s) (LRB): LUMBAR LAMINECTOMY FOR EPIDURAL ABSCESS (N/A)  Patient Location: PACU  Anesthesia Type: General  Level of Consciousness: awake  Airway & Oxygen Therapy: Patient Spontanous Breathing  Post-op Assessment: Report given to PACU RN  Post vital signs: Reviewed  Complications: No apparent anesthesia complications

## 2011-08-08 NOTE — Anesthesia Preprocedure Evaluation (Addendum)
Anesthesia Evaluation  Patient identified by MRN, date of birth, ID band Patient awake    Reviewed: Allergy & Precautions, H&P , NPO status , Patient's Chart, lab work & pertinent test results, reviewed documented beta blocker date and time   History of Anesthesia Complications (+) PONV  Airway Mallampati: III TM Distance: >3 FB Neck ROM: Full    Dental  (+) Teeth Intact and Implants   Pulmonary sleep apnea and Continuous Positive Airway Pressure Ventilation ,  breath sounds clear to auscultation  Pulmonary exam normal       Cardiovascular hypertension, Pt. on medications and Pt. on home beta blockers Rhythm:Regular Rate:Normal     Neuro/Psych    GI/Hepatic negative GI ROS, Neg liver ROS,   Endo/Other  negative endocrine ROS  Renal/GU negative Renal ROS     Musculoskeletal   Abdominal Normal abdominal exam  (+)   Peds  Hematology negative hematology ROS (+)   Anesthesia Other Findings   Reproductive/Obstetrics                          Anesthesia Physical Anesthesia Plan  ASA: III  Anesthesia Plan: General   Post-op Pain Management:    Induction: Intravenous  Airway Management Planned: Oral ETT  Additional Equipment:   Intra-op Plan:   Post-operative Plan: Extubation in OR  Informed Consent: I have reviewed the patients History and Physical, chart, labs and discussed the procedure including the risks, benefits and alternatives for the proposed anesthesia with the patient or authorized representative who has indicated his/her understanding and acceptance.   Dental advisory given  Plan Discussed with: CRNA, Anesthesiologist and Surgeon  Anesthesia Plan Comments:        Anesthesia Quick Evaluation

## 2011-08-08 NOTE — Anesthesia Postprocedure Evaluation (Signed)
  Anesthesia Post-op Note  Patient: John Brandt  Procedure(s) Performed: Procedure(s) (LRB): LUMBAR LAMINECTOMY FOR EPIDURAL ABSCESS (N/A)  Patient Location: PACU  Anesthesia Type: General  Level of Consciousness: awake  Airway and Oxygen Therapy: Patient Spontanous Breathing  Post-op Pain: mild  Post-op Assessment: Post-op Vital signs reviewed  Post-op Vital Signs: Reviewed  Complications: No apparent anesthesia complications

## 2011-08-08 NOTE — Op Note (Signed)
Date of procedure: 08/08/2011  Date of dictation: Same  Service: Neurosurgery  Preoperative diagnosis: T12 paraspinal mass/fluid collection  Postoperative diagnosis: Same  Procedure Name: Left T12 costotransversectomy with paraspinal biopsy.  Surgeon:Towanda Hornstein A.Miho Monda, M.D.  Asst. Surgeon:  Danielle Dess  Anesthesia: General  Indication: 61 year old male with an enlarging paraspinal mass. Fine-needle aspirate and aspiration for cultures negative. Sedimentation rate low at 9 but C-reactive protein elevated. Patient with back pain but no neurologic symptoms. Symptoms have been slow to progress but lesion has definitely enlarged over the past month. Patient presents now for open biopsy for better diagnosis of the problem.  Operative note: After induction anesthesia, patient positioned prone onto Wilson frame. Patient's thoracic and lumbar region prepped and draped sterilely 10 blade used to make a linear skin incision overlying the T12 vertebral level. This carried down sharply in the mid upper supper off Sexton performed exposing the lamina facet joints of T12 and L1 and the rudimentary transverse process of T12 and rib head of T12. X-ray taken level confirmed. Ring head of T12 transverse process and lateral pedicle were then resected using high-speed drill and Kerrison rongeurs. The paraspinal space was entered. Tissue samples were obtained for biopsy. I during the course again the deep space some purulent material was encountered and cultures were taken of this. There was no obvious fleshy tumor mass but there was abnormal tissue which was biopsied and sent to pathology. Initial frozen sections are nondiagnostic for any type of malignancy. The paraspinal space was explored. I felt that I got a good sampling of the tissue and the fluid. I washed this out and then placed a drain paraspinally on the left. Wound is then irrigated with area one final time. Wounds and close in layers with Vicryl sutures.  Steri-Strips triggers were applied. There were no apparent outpatient. Patient tolerated suture well and returned to recovery room postop.

## 2011-08-08 NOTE — Preoperative (Signed)
Beta Blockers   Reason not to administer Beta Blockers:Not Applicable 

## 2011-08-08 NOTE — Transfer of Care (Signed)
Immediate Anesthesia Transfer of Care Note  Patient: John Brandt  Procedure(s) Performed: Procedure(s) (LRB): LUMBAR LAMINECTOMY FOR EPIDURAL ABSCESS (N/A)  Patient Location: PACU  Anesthesia Type: General  Level of Consciousness: awake and alert   Airway & Oxygen Therapy: Patient Spontanous Breathing and Patient connected to face mask oxygen  Post-op Assessment: Report given to PACU RN  Post vital signs: Reviewed and stable  Complications: No apparent anesthesia complications

## 2011-08-08 NOTE — Consult Note (Addendum)
Anesthesia Note:  Patient is a 61 year old male posted for lumbar laminectomy for epidural abscess today.  I was not given his chart to review until this afternoon.  History included obesity with BMI 38, OSA (Dr. Craige Cotta), HTN, HLD, cardiac cath ~ 15 years ago in [place] for arm pain.  He underwent left sided fluora guided paraspinous abscess drainage @ T12 by Dr. Corliss Skains on 07/22/11.  PCP is Dr. Shaune Pollack at [facility].  He is a college professor/department head.  CXR on 08/02/11 showed mild cardiomegaly, but no acute disease.  MRI of the lumbar spine on 06/27/11 showed: 1. Inflammatory changes or mass on the left side of the T12  vertebra extending along the T11-T12 paraspinal soft tissues. This is incompletely visualized due to lumbar field of view. Thoracic spine MRI with and without infusion recommended for further assessment.  Labs noted.  WBC 14.9 (up from 10.2 07/22/11).  Meds listed include a Medrol Dosepak.    He had three EKGs done from 06/30/11-07/02/11 that showed SR, LAFB, anterolateral T wave abnormality consider ischemia.  He had cardiac enzymes at that visit (for N/V) with a negative troponin.  He denies chest pain or significant SOB.  He has to climb a few flights of stairs for work and denies any significant CV symptoms.  He has mild chronic LE edema, L>R.  He does not think that he has had an EKG since he moved to [place] ~ 2007.  Exam shows a regular, bradycardic heart rate, distant heart sounds--somewhat difficult to auscultate, but did not note any murmur.  No carotid bruits were noted.  Lungs clear anteriorly.  His Short Stay RN has notified Anesthesiologist Dr. Randa Evens.  She will evaluate him in Holding.  Shonna Chock, PA-C

## 2011-08-09 DIAGNOSIS — R29818 Other symptoms and signs involving the nervous system: Secondary | ICD-10-CM

## 2011-08-09 LAB — CBC WITH DIFFERENTIAL/PLATELET
Basophils Absolute: 0 10*3/uL (ref 0.0–0.1)
HCT: 37.5 % — ABNORMAL LOW (ref 39.0–52.0)
Lymphocytes Relative: 7 % — ABNORMAL LOW (ref 12–46)
Monocytes Absolute: 1 10*3/uL (ref 0.1–1.0)
Neutro Abs: 10.1 10*3/uL — ABNORMAL HIGH (ref 1.7–7.7)
Platelets: 160 10*3/uL (ref 150–400)
RDW: 15.3 % (ref 11.5–15.5)
WBC: 12.1 10*3/uL — ABNORMAL HIGH (ref 4.0–10.5)

## 2011-08-09 LAB — CBC
HCT: 38.9 % — ABNORMAL LOW (ref 39.0–52.0)
Hemoglobin: 12.8 g/dL — ABNORMAL LOW (ref 13.0–17.0)
MCH: 27.9 pg (ref 26.0–34.0)
MCHC: 32.9 g/dL (ref 30.0–36.0)
MCV: 84.7 fL (ref 78.0–100.0)
RBC: 4.59 MIL/uL (ref 4.22–5.81)

## 2011-08-09 LAB — BASIC METABOLIC PANEL
BUN: 13 mg/dL (ref 6–23)
CO2: 26 mEq/L (ref 19–32)
Calcium: 8.9 mg/dL (ref 8.4–10.5)
Creatinine, Ser: 0.77 mg/dL (ref 0.50–1.35)
GFR calc non Af Amer: >90 mL/min (ref >90)
Glucose, Bld: 113 mg/dL — ABNORMAL HIGH (ref 70–99)

## 2011-08-09 LAB — C-REACTIVE PROTEIN: CRP: 5.2 mg/dL — ABNORMAL HIGH (ref <0.60)

## 2011-08-09 MED ORDER — DEXTROSE 5 % IV SOLN
2.0000 g | INTRAVENOUS | Status: DC
  Administered 2011-08-09: 2 g via INTRAVENOUS
  Filled 2011-08-09 (×2): qty 2

## 2011-08-09 MED ORDER — ASPIRIN 81 MG PO CHEW
CHEWABLE_TABLET | ORAL | Status: AC
  Filled 2011-08-09: qty 1

## 2011-08-09 MED ORDER — SODIUM CHLORIDE 0.9 % IJ SOLN: 10.0000 mL | INTRAMUSCULAR | Status: DC | PRN

## 2011-08-09 NOTE — Progress Notes (Signed)
Peripherally Inserted Central Catheter/Midline Placement  The IV Nurse has discussed with the patient and/or persons authorized to consent for the patient, the purpose of this procedure and the potential benefits and risks involved with this procedure.  The benefits include less needle sticks, lab draws from the catheter and patient may be discharged home with the catheter.  Risks include, but not limited to, infection, bleeding, blood clot (thrombus formation), and puncture of an artery; nerve damage and irregular heat beat.  Alternatives to this procedure were also discussed.  PICC/Midline Placement Documentation  PICC / Midline Single Lumen 08/09/11 PICC Right Brachial (Active)       Stacie Glaze Horton 08/09/2011, 11:34 AM

## 2011-08-09 NOTE — Progress Notes (Signed)
RT Note: pt placed on CPAP 12cmH20 per home settings with 2L O2 bleen in and a nasal mask. Pt tolerating well, RT will monitor.

## 2011-08-09 NOTE — Consult Note (Signed)
[facility]    Date of Admission:  08/08/2011  Date of Consult:  08/09/2011  Reason for Consult: Paraspinal Abscess Referring Physician: Dr. Jordan Likes   HPI: John Brandt is an 61 y.o. male hypertension obstructive sleep apnea, this past May he develops acute onset of severe lower back pain that he noticed at work after bending over. This back pain was initially thought to be musculoskeletal but failed to respond to conservative management with nonsteroidal anti-inflammatory drugs muscle relaxants and narcotics. The patient ultimately underwent imaging with plain films and ultimately MRI which showed a paraspinal mass in the T12 region. The patient did not have fevers and chills except in late June when he did have onset of severe nausea vomiting fever and chills and was thought by the admitting service to have a possible viral gastroenteritis. Note an MRI done at that time showed: Inflammatory changes or mass on the left side of the T12  vertebra extending along the T11-T12 paraspinal soft tissues. This  Area was incompletely visualized due to lumbar field of view at that time. THe patient was worked up by Dr. Lelon Perla with neurosurgery who obtained further MRIs are not available to me in Epic but which showed this mass. Due the fact that the mass had an usual appearance for abrupt pyogenic infection and that there was no obvious vertebral body involvement or diskitis, and that the patient had a normal sedimentation rate C-reactive protein and was absent fevers chills or systemic symptoms beyond his severe pain the mass was felt by Dr. Jordan Likes  initially to not likely represent infection. Patient ultimately underwent a guided biopsy of this lesion on July 19 with pathology being inconclusive and cultures being negative for routine pathogens. Patient's back pain became increasingly severe and ultimately he was brought to the hospital for elective open biopsy by Dr. Theresa Duty in  the operating room. Yesterday Dr. Dutch Quint performed surgery and a productive area and encountered grossly purulent material which was sent for bacterial AFB and fungal cultures.  Patient appears to have been given vancomycin after the specimens were taken in the operating room and was continued on vancomycin along with prophylaxis and postoperatively. The lesion appears to been somewhat indolent in its presentation. The patient himself has had no trauma to his back. He has had no recent dental work. He has not been systemically ill with something that would be an obvious nidus of the bacteremia. He has not had any recent soft tissue or skin infections beyond his tinea pedis. Note the lesion did have a rather unusual appearance which would raise the idea of other pathogens such as tuberculosis or Brucella. The patient himself has a lifetime history of travel to [place] more than 15 years ago as well as [place]. He has not had exposure to any one with known tuberculosis. His son has traveled to [place] extensively but has not been sick with any pulmonary illness at home. The patient self also lives in [place] in an urban setting. He does have a pet. He has not been exposed to any parturient animals. No history of intravenous drug use.. .   Past Medical History  Diagnosis Date  . Hypertension   . Hyperlipidemia   . Allergic rhinitis   . Hx of cardiac cath   . Complication of anesthesia   . PONV (postoperative nausea and vomiting)   . Sleep apnea     sleep study 07-02-02    Past Surgical History  Procedure Date  . Vasectomy   . Dental implants   . Cardiac catheterization     more than 15 yrs ago  ergies:   Allergies  Allergen Reactions  . Fish-Derived Products Nausea And Vomiting  . Iodine Rash  . Penicillins Rash     Medications: I have reviewed patients current medications as documented in Epic Anti-infectives     Start     Dose/Rate Route Frequency Ordered Stop   08/08/11 2200    vancomycin (VANCOCIN) 1,250 mg in sodium chloride 0.9 % 250 mL IVPB        1,250 mg 166.7 mL/hr over 90 Minutes Intravenous Every 12 hours 08/08/11 2029     08/08/11 1930   ciprofloxacin (CIPRO) IVPB 400 mg        400 mg 200 mL/hr over 60 Minutes Intravenous Every 12 hours 08/08/11 1836     08/08/11 1540   bacitracin 50,000 Units in sodium chloride irrigation 0.9 % 500 mL irrigation  Status:  Discontinued          As needed 08/08/11 1541 08/08/11 1643   08/08/11 1418   bacitracin 98119 UNITS injection     Comments: WALSH, AMY: cabinet override         08/08/11 1418 08/09/11 0229   08/08/11 0000   vancomycin (VANCOCIN) 500 mg in sodium chloride 0.9 % 100 mL IVPB  Status:  Discontinued        500 mg 100 mL/hr over 60 Minutes Intravenous 120 min pre-op 08/07/11 1342 08/07/11 1343   08/08/11 0000   vancomycin (VANCOCIN) 1,500 mg in sodium chloride 0.9 % 500 mL IVPB        1,500 mg 250 mL/hr over 120 Minutes Intravenous 120 min pre-op 08/07/11 1343 08/08/11 1602          Social History:  reports that he has never smoked. He has never used smokeless tobacco. He reports that he drinks alcohol. He reports that he does not use illicit drugs.  Family History  Problem Relation Age of Onset  . Allergies Mother   . Heart disease Mother   . Lung cancer Mother     mesothelioma  . Aneurysm Brother     brain  . Heart disease Father   . Lung cancer Father     As in HPI and primary teams notes otherwise 12 point review of systems is negative  Blood pressure 140/79, pulse 61, temperature 98 F (36.7 C), temperature source Oral, resp. rate 17, height 6' 0.84" (1.85 m), weight 292 lb 5.3 oz (132.6 kg), SpO2 96.00%. General: Alert and awake, oriented x3, not in any acute distress. HEENT: anicteric sclera, pupils reactive to light and accommodation, EOMI, oropharynx clear and without exudate CVS regular rate, normal r,  no murmur rubs or gallops Chest: clear to auscultation bilaterally, no  wheezing, rales or rhonchi Abdomen: soft nontender, nondistended, normal bowel sounds, Extremities: no  clubbing or edema noted bilaterally Skin: back is bandaged and drain in place with bloody material Neuro: nonfocal, strength and sensation intact   Results for orders placed during the hospital encounter of 08/08/11 (from the past 48 hour(s))  TYPE AND SCREEN     Status: Normal   Collection Time   08/08/11 11:55 AM      Component Value Range Comment   ABO/RH(D) O NEG      Antibody Screen NEG      Sample Expiration 08/11/2011     ABO/RH     Status: Normal  Collection Time   08/08/11 11:55 AM      Component Value Range Comment   ABO/RH(D) O NEG     DIFFERENTIAL     Status: Abnormal   Collection Time   08/08/11 12:00 PM      Component Value Range Comment   Neutrophils Relative 78 (*) 43 - 77 %    Neutro Abs 11.8 (*) 1.7 - 7.7 K/uL    Lymphocytes Relative 12  12 - 46 %    Lymphs Abs 1.7  0.7 - 4.0 K/uL    Monocytes Relative 8  3 - 12 %    Monocytes Absolute 1.2 (*) 0.1 - 1.0 K/uL    Eosinophils Relative 2  0 - 5 %    Eosinophils Absolute 0.3  0.0 - 0.7 K/uL    Basophils Relative 0  0 - 1 %    Basophils Absolute 0.0  0.0 - 0.1 K/uL   ANAEROBIC CULTURE     Status: Normal (Preliminary result)   Collection Time   08/08/11  3:49 PM      Component Value Range Comment   Specimen Description ABSCESS      Special Requests PARASPINAL ABSCESS      Gram Stain        Value: FEW WBC PRESENT,BOTH PMN AND MONONUCLEAR     NO SQUAMOUS EPITHELIAL CELLS SEEN     NO ORGANISMS SEEN   Culture        Value: NO ANAEROBES ISOLATED; CULTURE IN PROGRESS FOR 5 DAYS   Report Status PENDING     CULTURE, ROUTINE-ABSCESS     Status: Normal (Preliminary result)   Collection Time   08/08/11  3:49 PM      Component Value Range Comment   Specimen Description ABSCESS      Special Requests PARASPINAL ABSCESS      Gram Stain        Value: FEW WBC PRESENT,BOTH PMN AND MONONUCLEAR     NO SQUAMOUS EPITHELIAL CELLS  SEEN     NO ORGANISMS SEEN   Culture Culture reincubated for better growth      Report Status PENDING     SEDIMENTATION RATE     Status: Abnormal   Collection Time   08/08/11  6:44 PM      Component Value Range Comment   Sed Rate 25 (*) 0 - 16 mm/hr   C-REACTIVE PROTEIN     Status: Abnormal   Collection Time   08/08/11  6:44 PM      Component Value Range Comment   CRP 5.2 (*) <0.60 mg/dL   CBC     Status: Abnormal   Collection Time   08/09/11  7:00 AM      Component Value Range Comment   WBC 13.0 (*) 4.0 - 10.5 K/uL    RBC 4.59  4.22 - 5.81 MIL/uL    Hemoglobin 12.8 (*) 13.0 - 17.0 g/dL    HCT 16.1 (*) 09.6 - 52.0 %    MCV 84.7  78.0 - 100.0 fL    MCH 27.9  26.0 - 34.0 pg    MCHC 32.9  30.0 - 36.0 g/dL    RDW 04.5  40.9 - 81.1 %    Platelets 178  150 - 400 K/uL   BASIC METABOLIC PANEL     Status: Abnormal   Collection Time   08/09/11  7:00 AM      Component Value Range Comment   Sodium 133 (*) 135 - 145 mEq/L  Potassium 3.9  3.5 - 5.1 mEq/L    Chloride 97  96 - 112 mEq/L    CO2 26  19 - 32 mEq/L    Glucose, Bld 113 (*) 70 - 99 mg/dL    BUN 13  6 - 23 mg/dL    Creatinine, Ser 1.61  0.50 - 1.35 mg/dL    Calcium 8.9  8.4 - 09.6 mg/dL    GFR calc non Af Amer >90  >90 mL/min    GFR calc Af Amer >90  >90 mL/min       Component Value Date/Time   SDES ABSCESS 08/08/2011 1549   SDES ABSCESS 08/08/2011 1549   SPECREQUEST PARASPINAL ABSCESS 08/08/2011 1549   SPECREQUEST PARASPINAL ABSCESS 08/08/2011 1549   CULT NO ANAEROBES ISOLATED; CULTURE IN PROGRESS FOR 5 DAYS 08/08/2011 1549   CULT Culture reincubated for better growth 08/08/2011 1549   REPTSTATUS PENDING 08/08/2011 1549   REPTSTATUS PENDING 08/08/2011 1549   Dg Lumbar Spine 1 View  08/08/2011  *RADIOLOGY REPORT*  Clinical Data: Suspected T11-T12 disc space infection.  LUMBAR SPINE - 1 VIEW  Comparison: Thoracic MRI 07/18/2011.  Lumbar MRI 06/27/2011.  Findings: Single cross-table lateral view of the lumbar spine labeled #1 at 1540 hours.   Skin spreaders are present posteriorly at T12.  A blunt surgical instrument is directed towards the posterior aspect of the T12-L1 disc space level.  There is irregularity of the superior endplate of T12.  IMPRESSION: Intraoperative localization view as described.  Original Report Authenticated By: Gerrianne Scale, M.D.   Dg Chest Port 1 View  08/08/2011  *RADIOLOGY REPORT*  Clinical Data: Postop, evaluate pneumothorax  PORTABLE CHEST - 1 VIEW  Comparison: Chest x-ray dated 08/02/2011  Findings: Low inspiratory volumes and bibasilar linear opacities favored to represent atelectasis.  No pneumothorax identified.  The left costophrenic angle is incompletely imaged.  Cardiac and mediastinal contours are exaggerated by portable technique and low lung volumes.  Calcified pulmonary nodule in the right mid lung, unchanged.  IMPRESSION:  No pneumothorax.  Low inspiratory volumes and bibasilar atelectasis.  Original Report Authenticated By: Vilma Prader     Recent Results (from the past 720 hour(s))  BODY FLUID CULTURE     Status: Normal   Collection Time   07/22/11  3:43 PM      Component Value Range Status Comment   Specimen Description FLUID   Final    Special Requests PARASPINAL   Final    Gram Stain     Final    Value: NO WBC SEEN     NO ORGANISMS SEEN   Culture NO GROWTH 3 DAYS   Final    Report Status 07/26/2011 FINAL   Final   ANAEROBIC CULTURE     Status: Normal   Collection Time   07/22/11  3:43 PM      Component Value Range Status Comment   Specimen Description FLUID   Final    Special Requests PARASPINAL   Final    Gram Stain     Final    Value: NO WBC SEEN     NO ORGANISMS SEEN   Culture NO ANAEROBES ISOLATED   Final    Report Status 07/27/2011 FINAL   Final   SURGICAL PCR SCREEN     Status: Abnormal   Collection Time   08/02/11  8:37 AM      Component Value Range Status Comment   MRSA, PCR NEGATIVE  NEGATIVE Final    Staphylococcus aureus POSITIVE (*)  NEGATIVE Final   ANAEROBIC  CULTURE     Status: Normal (Preliminary result)   Collection Time   08/08/11  3:49 PM      Component Value Range Status Comment   Specimen Description ABSCESS   Final    Special Requests PARASPINAL ABSCESS   Final    Gram Stain     Final    Value: FEW WBC PRESENT,BOTH PMN AND MONONUCLEAR     NO SQUAMOUS EPITHELIAL CELLS SEEN     NO ORGANISMS SEEN   Culture     Final    Value: NO ANAEROBES ISOLATED; CULTURE IN PROGRESS FOR 5 DAYS   Report Status PENDING   Incomplete   CULTURE, ROUTINE-ABSCESS     Status: Normal (Preliminary result)   Collection Time   08/08/11  3:49 PM      Component Value Range Status Comment   Specimen Description ABSCESS   Final    Special Requests PARASPINAL ABSCESS   Final    Gram Stain     Final    Value: FEW WBC PRESENT,BOTH PMN AND MONONUCLEAR     NO SQUAMOUS EPITHELIAL CELLS SEEN     NO ORGANISMS SEEN   Culture Culture reincubated for better growth   Final    Report Status PENDING   Incomplete      Impression/Recommendation  61year-old man with a paraspinal abscess with fairly indolent presentation an abscess likely having been present since May of 2013, post open biopsy with culture sent for bacterial AFB and fungal organisms.  1) paraspinal abscess: Note the imaging available in Epic which is includes the MRI from June does not include the thoracic spine but I believe Dr. Dutch Quint has better images that he has available to him through his office since another MRI was done as an outpatient at image the thoracic spine. Want to make sure that certainly the entire thoracic spine was imaged since it was only partially captured on the lumbar spine films done at count in June. Patient has had successful surgery with debridement of the abscess.  He is currently on a reasonable regimen of vancomycin and ciprofloxacin.    I reviewed the patient's allergies to penicillin and he is actually still tolerated amoxicillin and Augmentin without difficulties. Therefore it would  not be difficult to treat him with an intravenous cephalosporin. I might prefer the latter in terms of impaired drug given the higher risk for C. difficile colitis with ciprofloxacin.  I would treat him for a minimum of 6 weeks with IV antibiotic therapy.  The unusual presentation of this particular paraspinal mass I would also consider other unusual pathogens such as tuberculosis nontuberculous mycobacteria and fungi and even Brucella.   Cultures have been sent for AFB , fungi and I will also check a quantiferon gold and brucella antibody. I will also check hiv ab for screening purposes.  We will follow in house and arrange followup 2 weeks after dc from the hospital to ensure he is tolerating his abx well and then see him at end of 6 week course of therapy.     Thank you so much for this interesting consult  [facility] [phone] (pager) [phone] (office) 08/09/2011, 1:26 PM  Paulette Blanch Dam 08/09/2011, 1:26 PM

## 2011-08-09 NOTE — Progress Notes (Signed)
UR COMPLETED  

## 2011-08-09 NOTE — Progress Notes (Signed)
Postop day 1. Pain well controlled. Pain seems more incisional currently and his preoperative pain seems improved. No radiating pain. No motor or sensory complaints. No chest or nominal complaints.  He is afebrile. His vitals are stable. Urine output is good. White blood cell count is slightly elevated. Sedimentation rate is also slightly elevated. C-reactive protein is elevated although less so than last month. He is awake and alert. He is oriented and appropriate. Cranial nerve function is intact. Motor and sensory function of the extremities is normal. Wound is clean dry and intact. Drain output is minimal.  Cultures show no growth. Gram stain shows some white blood cells but no organisms. Pathology pending.  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 Continue in. Vancomycin and Cipro. Tentatively plan for discharge home on 2 drug therapy tomorrow pending infectious diseases final recommendations.

## 2011-08-10 LAB — SEDIMENTATION RATE: Sed Rate: 50 mm/hr — ABNORMAL HIGH (ref 0–16)

## 2011-08-10 LAB — BASIC METABOLIC PANEL
CO2: 27 mEq/L (ref 19–32)
Chloride: 101 mEq/L (ref 96–112)
Sodium: 136 mEq/L (ref 135–145)

## 2011-08-10 LAB — C-REACTIVE PROTEIN: CRP: 23.1 mg/dL — ABNORMAL HIGH (ref <0.60)

## 2011-08-10 MED ORDER — VANCOMYCIN HCL 1000 MG IV SOLR: 1250.0000 mg | Freq: Two times a day (BID) | INTRAVENOUS | Status: DC

## 2011-08-10 MED ORDER — HEPARIN SOD (PORK) LOCK FLUSH 100 UNIT/ML IV SOLN
250.0000 [IU] | INTRAVENOUS | Status: AC | PRN
  Administered 2011-08-10: 250 [IU]

## 2011-08-10 MED ORDER — HYDROCODONE-ACETAMINOPHEN 5-325 MG PO TABS: 1.0000 | ORAL_TABLET | ORAL | Status: AC | PRN

## 2011-08-10 MED ORDER — FENTANYL 25 MCG/HR TD PT72: 4.0000 | MEDICATED_PATCH | TRANSDERMAL | Status: AC

## 2011-08-10 MED ORDER — DIAZEPAM 5 MG PO TABS: 5.0000 mg | ORAL_TABLET | Freq: Four times a day (QID) | ORAL | Status: AC | PRN

## 2011-08-10 NOTE — Progress Notes (Signed)
RN notified of abnormal BP

## 2011-08-10 NOTE — Discharge Summary (Signed)
Physician Discharge Summary  Patient ID: John Brandt MRN: [id number] DOB/AGE: [birth year] 61 y.o.  Admit date: 08/08/2011 Discharge date: 08/10/2011  Admission Diagnoses:  Discharge Diagnoses:  Principal Problem:  *Paraspinal mass   Discharged Condition: good  Hospital Course: Patient in the hospital for open biopsy of a paravertebral lesion. Biopsy and cultures are consistent with a staph aureus infection cultures sensitivity pending. Overall patient neurologically intact. Patient's pain well controlled. Plan is for discharge home on IV vancomycin with home health nursing. We'll potentially changed vancomycin to a cephalosporin depending on the sensitivities obtained from the final cultures.  Consults: ID  Significant Diagnostic Studies:   Treatments:  Discharge Exam: Blood pressure 143/83, pulse 66, temperature 97.3 F (36.3 C), temperature source Oral, resp. rate 18, height 6' 0.84" (1.85 m), weight 132.6 kg (292 lb 5.3 oz), SpO2 99.00%. He is awake and alert oriented and appropriate. Cranial nerve function is intact. Motor and sensory function intact. Wound healing well. Chest and abdomen benign.  Disposition: 01-Home or Self Care  Discharge Orders    Future Orders Please Complete By Expires   Home Health      Questions: Responses:   To provide the following care/treatments RN   Face-to-face encounter      Comments:   I Leahmarie Gasiorowski A certify that this patient is under my care and that I, or a nurse practitioner or physician's assistant working with me, had a face-to-face encounter that meets the physician face-to-face encounter requirements with this patient on 08/10/2011.   Questions: Responses:   The encounter with the patient was in whole, or in part, for the following medical condition, which is the primary reason for home health care Paraspinal abscess   I certify that, based on my findings, the following services are medically necessary home health services Nursing     My clinical findings support the need for the above services High Risk for rehospitalization   Further, I certify that my clinical findings support that this patient is homebound (i.e. absences from home require considerable and taxing effort and are for medical reasons or religious services or infrequently or of short duration when for other reasons) Ambulates short distances less than 300 feet   To provide the following care/treatments RN     Medication List  As of 08/10/2011 11:11 AM   STOP taking these medications         methylPREDNISolone 4 MG tablet         TAKE these medications         acetaminophen 500 MG tablet   Commonly known as: TYLENOL   Take 1,000 mg by mouth every 6 (six) hours as needed. For pain      amLODipine 10 MG tablet   Commonly known as: NORVASC   10 mg. 1 by mouth daily      aspirin 81 MG tablet   Take 81 mg by mouth daily.      atorvastatin 10 MG tablet   Commonly known as: LIPITOR   Take 10 mg by mouth daily.      cloNIDine 0.2 MG tablet   Commonly known as: CATAPRES   Take 1 tablet (0.2 mg total) by mouth 3 (three) times daily. Hold unless BP > 140 when checked      diazepam 5 MG tablet   Commonly known as: VALIUM   Take 1-2 tablets (5-10 mg total) by mouth every 6 (six) hours as needed.      diazepam 5 MG tablet  Commonly known as: VALIUM   Take 5-10 mg by mouth every 6 (six) hours as needed. As needed for pain or sleep      fentaNYL 25 MCG/HR   Commonly known as: DURAGESIC - dosed mcg/hr   Place 4 patches (100 mcg total) onto the skin every other day.      fentaNYL 100 MCG/HR   Commonly known as: DURAGESIC - dosed mcg/hr   Place 1 patch onto the skin every other day.      HYDROcodone-acetaminophen 5-325 MG per tablet   Commonly known as: NORCO/VICODIN   Take 1-2 tablets by mouth every 4 (four) hours as needed.      KLOR-CON M20 20 MEQ tablet   Generic drug: potassium chloride SA   Take 40 mEq by mouth 3 (three) times daily. 2  tablets three times dailt      metoprolol succinate 50 MG 24 hr tablet   Commonly known as: TOPROL-XL   Take 50 mg by mouth daily. Take with or immediately following a meal.      multivitamin tablet   Take 1 tablet by mouth daily.      polyethylene glycol packet   Commonly known as: MIRALAX / GLYCOLAX   Take 17 g by mouth daily.      sodium chloride 0.9 % SOLN 250 mL with vancomycin 1000 MG SOLR 1,250 mg   Inject 1,250 mg into the vein every 12 (twelve) hours.      spironolactone 25 MG tablet   Commonly known as: ALDACTONE   Take 25 mg by mouth daily. 1 by mouth daily      triamcinolone 55 MCG/ACT nasal inhaler   Commonly known as: NASACORT   Place 2 sprays into the nose daily. 2 sprays in each nostril daily      Vitamin D (Ergocalciferol) 50000 UNITS Caps   Commonly known as: DRISDOL   50,000 Units. 1 tablet weekly           Follow-up Information    Follow up with Demarius Archila A, MD. Call in 1 week.   Contact information:   [address], [address] [place] [address] [phone]          Signed: Temple Pacini 08/10/2011, 11:11 AM

## 2011-08-10 NOTE — Progress Notes (Signed)
ANTIBIOTIC CONSULT NOTE-PROGRESS NOTE  Pharmacy Consult for Vancomycin Indication: Staph aureus parastinal abscess [sensitivities pending]  Hospital Problems Principal Problem:  *Paraspinal mass   Allergies Allergies  Allergen Reactions  . Fish-Derived Products Nausea And Vomiting  . Iodine Rash  . Penicillins Rash    Patient Measurements: Height: 6' 0.83" (185 cm) Weight: 292 lb 5.3 oz (132.6 kg) IBW/kg (Calculated) : 79.52   Vital Signs: BP 143/83  Pulse 66  Temp 97.3 F (36.3 C) (Oral)  Resp 18  Ht 6' 0.84" (1.85 m)  Wt 292 lb 5.3 oz (132.6 kg)  BMI 38.74 kg/m2  SpO2 99%  Intake/Output from previous day: 08/06 0701 - 08/07 0700 In: 800 [IV Piggyback:800] Out: -   Labs:  Basename 08/10/11 0610 08/09/11 1413 08/09/11 0700  WBC -- 12.1* 13.0*  HGB -- 12.2* 12.8*  PLT -- 160 178  LABCREA -- -- --  CREATININE 0.84 -- 0.77   Estimated Creatinine Clearance: 129.9 ml/min (by C-G formula based on Cr of 0.84).  Microbiology: Recent Results (from the past 720 hour(s))  BODY FLUID CULTURE     Status: Normal   Collection Time   07/22/11  3:43 PM      Component Value Range Status Comment   Specimen Description FLUID   Final    Special Requests PARASPINAL   Final    Gram Stain     Final    Value: NO WBC SEEN     NO ORGANISMS SEEN   Culture NO GROWTH 3 DAYS   Final    Report Status 07/26/2011 FINAL   Final   ANAEROBIC CULTURE     Status: Normal   Collection Time   07/22/11  3:43 PM      Component Value Range Status Comment   Specimen Description FLUID   Final    Special Requests PARASPINAL   Final    Gram Stain     Final    Value: NO WBC SEEN     NO ORGANISMS SEEN   Culture NO ANAEROBES ISOLATED   Final    Report Status 07/27/2011 FINAL   Final   SURGICAL PCR SCREEN     Status: Abnormal   Collection Time   08/02/11  8:37 AM      Component Value Range Status Comment   MRSA, PCR NEGATIVE  NEGATIVE Final    Staphylococcus aureus POSITIVE (*) NEGATIVE Final     AFB CULTURE WITH SMEAR     Status: Normal (Preliminary result)   Collection Time   08/08/11  3:49 PM      Component Value Range Status Comment   Specimen Description ABSCESS   Final    Special Requests PARASPINAL ABSCESS   Final    ACID FAST SMEAR NO ACID FAST BACILLI SEEN   Final    Culture     Final    Value: CULTURE WILL BE EXAMINED FOR 6 WEEKS BEFORE ISSUING A FINAL REPORT   Report Status PENDING   Incomplete   ANAEROBIC CULTURE     Status: Normal (Preliminary result)   Collection Time   08/08/11  3:49 PM      Component Value Range Status Comment   Specimen Description ABSCESS   Final    Special Requests PARASPINAL ABSCESS   Final    Gram Stain     Final    Value: FEW WBC PRESENT,BOTH PMN AND MONONUCLEAR     NO SQUAMOUS EPITHELIAL CELLS SEEN     NO ORGANISMS SEEN  Culture     Final    Value: NO ANAEROBES ISOLATED; CULTURE IN PROGRESS FOR 5 DAYS   Report Status PENDING   Incomplete   CULTURE, ROUTINE-ABSCESS     Status: Normal (Preliminary result)   Collection Time   08/08/11  3:49 PM      Component Value Range Status Comment   Specimen Description ABSCESS   Final    Special Requests PARASPINAL ABSCESS   Final    Gram Stain     Final    Value: FEW WBC PRESENT,BOTH PMN AND MONONUCLEAR     NO SQUAMOUS EPITHELIAL CELLS SEEN     NO ORGANISMS SEEN   Culture     Final    Value: MODERATE STAPHYLOCOCCUS AUREUS     Note: RIFAMPIN AND GENTAMICIN SHOULD NOT BE USED AS SINGLE DRUGS FOR TREATMENT OF STAPH INFECTIONS.   Report Status PENDING   Incomplete     Anti-infectives Anti-infectives     Start     Dose/Rate Route Frequency Ordered Stop   08/10/11 0000   sodium chloride 0.9 % SOLN 250 mL with vancomycin 1000 MG SOLR 1,250 mg        1,250 mg 166.7 mL/hr over 90 Minutes Intravenous Every 12 hours 08/10/11 1109     08/09/11 1400   cefTRIAXone (ROCEPHIN) 2 g in dextrose 5 % 50 mL IVPB  Status:  Discontinued        2 g 100 mL/hr over 30 Minutes Intravenous Every 24 hours 08/09/11  1341 08/10/11 0852   08/08/11 2200   vancomycin (VANCOCIN) 1,250 mg in sodium chloride 0.9 % 250 mL IVPB        1,250 mg 166.7 mL/hr over 90 Minutes Intravenous Every 12 hours 08/08/11 2029     08/08/11 1930   ciprofloxacin (CIPRO) IVPB 400 mg  Status:  Discontinued        400 mg 200 mL/hr over 60 Minutes Intravenous Every 12 hours 08/08/11 1836 08/09/11 1340   08/08/11 1540   bacitracin 50,000 Units in sodium chloride irrigation 0.9 % 500 mL irrigation  Status:  Discontinued          As needed 08/08/11 1541 08/08/11 1643   08/08/11 1418   bacitracin 60454 UNITS injection     Comments: WALSH, AMY: cabinet override         08/08/11 1418 08/09/11 0229   08/08/11 0000   vancomycin (VANCOCIN) 500 mg in sodium chloride 0.9 % 100 mL IVPB  Status:  Discontinued        500 mg 100 mL/hr over 60 Minutes Intravenous 120 min pre-op 08/07/11 1342 08/07/11 1343   08/08/11 0000   vancomycin (VANCOCIN) 1,500 mg in sodium chloride 0.9 % 500 mL IVPB        1,500 mg 250 mL/hr over 120 Minutes Intravenous 120 min pre-op 08/07/11 1343 08/08/11 1602          Assessment:  Day # 3 of Vancomycin for + Staph aureus [sensitivities pending]  paraspinal abscess, s/p Lumbar Laminectomy  Sensitivities pending  Anticipate discharge home when finalized on IV Vancomycin per HHRN.  Will need Vancomycin levels for evaluation of therapy/possible accumulation.  Goal of Therapy:   Vancomycin trough level 15-20 mcg/ml  Plan:   Vancomycin trough tonight unless patient is discharged.  If patient discharged, recommend Vancomycin trough Thursday or Friday.  Brittanie Dosanjh, Elisha Headland, Pharm.D.  08/10/2011 12:33 PM

## 2011-08-10 NOTE — Progress Notes (Signed)
Pt hv d/c'd intact. Iv/picc locked by IV team. Pt dc instructions provided. Pt verbalized understanding. Rx provided. Pt under no s/s distress.

## 2011-08-10 NOTE — Care Management Note (Signed)
    Page 1 of 1   08/10/2011     2:22:46 PM   CARE MANAGEMENT NOTE 08/10/2011  Patient:  John Brandt, John Brandt   Account Number:  [id number]  Date Initiated:  08/10/2011  Documentation initiated by:  Anette Guarneri  Subjective/Objective Assessment:   POD#1 s/p Left T12 costotransversectomy with paraspinal biopsy  needs HH abx     Action/Plan:   home with [facility] services   Anticipated DC Date:  08/10/2011   Anticipated DC Plan:  HOME W HOME HEALTH SERVICES      DC Planning Services  CM consult      [facility] Choice  HOME HEALTH   Choice offered to / List presented to:  C-1 Patient        HH arranged  HH-1 RN      [facility] agency  [facility]   Status of service:  Completed, signed off Medicare Important Message given?  NO (If response is "NO", the following Medicare IM given date fields will be blank) Date Medicare IM given:   Date Additional Medicare IM given:    Discharge Disposition:  HOME W HOME HEALTH SERVICES  Per UR Regulation:  Reviewed for med. necessity/level of care/duration of stay  If discussed at Long Length of Stay Meetings, dates discussed:    Comments:  08/10/11  14:20 Anette Guarneri RN/CM spoke with patient and wife regarding HH needs, patient choice for [facility] services is [facility] Contacted [facility], arranged for [facility] services to begin after discharge.

## 2011-08-11 LAB — CULTURE, ROUTINE-ABSCESS

## 2011-08-12 ENCOUNTER — Telehealth: Payer: Self-pay | Admitting: Infectious Disease

## 2011-08-12 NOTE — Telephone Encounter (Signed)
Patients culture grew out MSSA, I acalled [facility] to change him to ancef 2 grams IV q 8 hours.  Asher Muir he should have hsfu with ID clinic MD in next 2 weeks and then with one of [place] (me?) as he nears the end of his therapy in 5-6 weeks

## 2011-08-13 LAB — ANAEROBIC CULTURE

## 2011-08-15 LAB — BRUCELLA IGG, IGM

## 2011-08-25 ENCOUNTER — Ambulatory Visit (INDEPENDENT_AMBULATORY_CARE_PROVIDER_SITE_OTHER): Payer: BC Managed Care – PPO | Admitting: Internal Medicine

## 2011-08-25 ENCOUNTER — Encounter: Payer: Self-pay | Admitting: Internal Medicine

## 2011-08-25 VITALS — BP 135/82 | HR 60 | Temp 97.4°F | Wt 294.0 lb

## 2011-08-25 DIAGNOSIS — M4620 Osteomyelitis of vertebra, site unspecified: Secondary | ICD-10-CM

## 2011-08-25 DIAGNOSIS — M8618 Other acute osteomyelitis, other site: Secondary | ICD-10-CM

## 2011-08-25 NOTE — Progress Notes (Signed)
INFECTIOUS DISEASES CLINIC  RFV: MSSA T12 paraspinal abscess s/p IX D Subjective:    Patient ID: John Brandt, male    DOB: [birth year], 61 y.o.   MRN: [id number]  HPI Mr .[name] is a pleasant 61yo Male with PMHX of OSA, NASH, who had new onset back pain was initially thought to be musculoskeletal but failed to respond to conservative management with nonsteroidal anti-inflammatory drugs muscle relaxants and narcotics. The patient ultimately underwent imaging with plain films and ultimately MRI which showed a paraspinal mass in the T12 region. In late June, he recalls having a severe bout of  nausea vomiting fever and chills which was thought to be  viral gastroenteritis. Note an MRI done at that time showed: Inflammatory changes or mass on the left side of the T12 vertebra extending along the T11-T12 paraspinal soft tissues. This  Area was incompletely visualized due to lumbar field of view at that time. In mid July he underwent a guided biopsy of this lesion on July 19 with pathology being inconclusive and cultures being negative for routine pathogens. Patient's back pain became increasingly severe and ultimately he was brought to the hospital for elective open biopsy by Dr. Dutch Quint on 08/08/11 s/p  08/08/11 surgery : Left T12 costotransversectomy with paraspinal biopsy. Micro showed MSSA. He initially was discharged on vancomycin but subsequently changed to cefazolin 2gm IV Q 8hr for which he has been taking for the last 2 wks. Currently on day # 17 of antibiotics. He still has discomfort in lower back. Mostly at night, and getting up in the morning. Impaired movement, just slower. A lot better since surgery. He is taking   Saw dr. Dutch Quint 2 days ago.all bandages removed/steri strip. No drainage or redness at the incision site.  No fever, chills, nightsweats since discharged. He only complains of having Hoarseness of throat and dry mouth. He denies Rash, diarrhea, no pain at picc line. Appetite is  depressed, not as hungry as before. Some food still don't taste the same. Energy level still not back at baseline.  Current Outpatient Prescriptions on File Prior to Visit  Medication Sig Dispense Refill  . acetaminophen (TYLENOL) 500 MG tablet Take 1,000 mg by mouth every 6 (six) hours as needed. For pain      . amLODipine (NORVASC) 10 MG tablet 10 mg. 1 by mouth daily      . aspirin 81 MG tablet Take 81 mg by mouth daily.      Marland Kitchen atorvastatin (LIPITOR) 10 MG tablet Take 10 mg by mouth daily.       . cloNIDine (CATAPRES) 0.2 MG tablet Take 1 tablet (0.2 mg total) by mouth 3 (three) times daily. Hold unless BP > 140 when checked      . diazepam (VALIUM) 5 MG tablet Take 5-10 mg by mouth every 6 (six) hours as needed. As needed for pain or sleep      . fentaNYL (DURAGESIC - DOSED MCG/HR) 100 MCG/HR Place 1 patch onto the skin every other day.      . fentaNYL (DURAGESIC - DOSED MCG/HR) 25 MCG/HR Place 4 patches (100 mcg total) onto the skin every other day.  60 patch  0  . KLOR-CON M20 20 MEQ tablet Take 40 mEq by mouth 3 (three) times daily. 2 tablets three times dailt      . metoprolol succinate (TOPROL-XL) 50 MG 24 hr tablet Take 50 mg by mouth daily. Take with or immediately following a meal.      .  Multiple Vitamin (MULTIVITAMIN) tablet Take 1 tablet by mouth daily.      . polyethylene glycol (MIRALAX / GLYCOLAX) packet Take 17 g by mouth daily.      . sodium chloride 0.9 % SOLN 250 mL with vancomycin 1000 MG SOLR 1,250 mg Inject 1,250 mg into the vein every 12 (twelve) hours.  60 Package  0  . spironolactone (ALDACTONE) 25 MG tablet Take 25 mg by mouth daily. 1 by mouth daily      . triamcinolone (NASACORT) 55 MCG/ACT nasal inhaler Place 2 sprays into the nose daily. 2 sprays in each nostril daily      . Vitamin D, Ergocalciferol, (DRISDOL) 50000 UNITS CAPS 50,000 Units. 1 tablet weekly       Active Ambulatory Problems    Diagnosis Date Noted  . OSA (obstructive sleep apnea) 03/08/2011  .  Insomnia 03/08/2011  . Nausea & vomiting 06/30/2011  . Leukocytosis 06/30/2011  . Hypokalemia 06/30/2011  . Hepatic steatosis 06/30/2011  . Paraspinal mass 08/08/2011   Resolved Ambulatory Problems    Diagnosis Date Noted  . No Resolved Ambulatory Problems   Past Medical History  Diagnosis Date  . Hypertension   . Hyperlipidemia   . Allergic rhinitis   . Hx of cardiac cath   . Complication of anesthesia   . PONV (postoperative nausea and vomiting)   . Sleep apnea    History  Substance Use Topics  . Smoking status: Never Smoker   . Smokeless tobacco: Never Used  . 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 Live in [place].  Family hx: family history includes Allergies in his mother; Aneurysm in his brother; Heart disease in his father and mother; and Lung cancer in his father and mother. Review of Systems Review of Systems  Constitutional: Negative for fever, chills, diaphoresis, activity change, slight decrease in appetite change, and some fatigue, but no unexpected weight change.  HENT: Negative for congestion, sore throat, rhinorrhea, sneezing, trouble swallowing and sinus pressure.  Eyes: Negative for photophobia and visual disturbance.  Respiratory: Negative for cough, chest tightness, shortness of breath, wheezing and stridor.  Cardiovascular: Negative for chest pain, palpitations and leg swelling.  Gastrointestinal: Negative for nausea, vomiting, abdominal pain, diarrhea, constipation, blood in stool, abdominal distention and anal bleeding.  Genitourinary: Negative for dysuria, hematuria, flank pain and difficulty urinating.  Musculoskeletal: per hpi Skin: Negative for color change, pallor, rash and wound.  Neurological: Negative for dizziness, tremors, weakness and light-headedness.  Hematological: Negative for adenopathy. Does not bruise/bleed easily.  Psychiatric/Behavioral: Negative for behavioral problems, confusion, sleep disturbance,  dysphoric mood, decreased concentration and agitation.       Objective:   Physical Exam BP 135/82  Pulse 60  Temp 97.4 F (36.3 C) (Oral)  Wt 294 lb (133.358 kg) Physical Exam  Constitutional: He is oriented to person, place, and time. He appears well-developed and well-nourished. No distress.  HENT:  Mouth/Throat: Oropharynx is clear and moist. No oropharyngeal exudate. haorse voice+; uvalla is midline Cardiovascular: Normal rate, regular rhythm and normal heart sounds. Exam reveals no gallop and no friction rub.  No murmur heard.  Pulmonary/Chest: Effort normal and breath sounds normal. No respiratory distress. He has no wheezes.  Abdominal: Soft. Bowel sounds are normal. He exhibits no distension. There is no tenderness.  Lymphadenopathy:  He has no cervical adenopathy.  Neurological: He is alert and oriented to person, place, and time.  Skin: incision site looking good. No drainage.  No erythema, no fluctuance. Abrasion noted from dressing. RUE picc line c/d/i  Psychiatric: He has a normal mood and affect. His behavior is normal.       Assessment & Plan:  mssa paraspinal abscess= continue on cefazolin 2gm IV Q 8hr for a total of 6 wks ,until next visit, where we will assess if need to do oral suppressive therapy. Will need to check crp and esr at that time. No labs needed today. Has follow up already established.  Hoarseness of voice = recommended otc longenzes, increase water/fluids intake. ? Possible intubation injury? But no phonation difficulty.  Cc: poole

## 2011-08-31 ENCOUNTER — Encounter: Payer: Self-pay | Admitting: Infectious Disease

## 2011-09-08 ENCOUNTER — Telehealth: Payer: Self-pay

## 2011-09-08 NOTE — Telephone Encounter (Signed)
Pt's wife called.   Pt is having nausea and vomiting that started this morning at least 4 times. He has been taking antibiotics for 4 weeks.   He has medication for nausea which they have not taken.  They are calling for advice.  Pt was advised to take the Zofran to see if he is able to keep it down and if so  Will  this give him relief from the nausea.  Pt stated having this problem before and became dehydrated which lead to a hospital admission.

## 2011-09-12 ENCOUNTER — Encounter: Payer: Self-pay | Admitting: Infectious Disease

## 2011-09-12 ENCOUNTER — Telehealth: Payer: Self-pay | Admitting: Infectious Disease

## 2011-09-12 NOTE — Telephone Encounter (Signed)
Patient with confluent rash on arms and also on trunk. Changing to vancomycin

## 2011-09-13 ENCOUNTER — Telehealth: Payer: Self-pay | Admitting: Licensed Clinical Social Worker

## 2011-09-13 NOTE — Telephone Encounter (Signed)
Monday 9/16

## 2011-09-13 NOTE — Telephone Encounter (Signed)
Patient's wife was concerned that rash was still present after changing antibiotics, but the patient is feeling better and not complaining of itching like before. He has slight nausea in the morning but subsides after zofran and does not return. She stated the [facility] from [facility] will be coming today to draw blood, and I advised her that if the patient's symptoms worsen to give [place] a call. I explained that it may take several days for the rash to disappear.

## 2011-09-13 NOTE — Telephone Encounter (Signed)
He should be fine when is he coming to see [place]?

## 2011-09-14 ENCOUNTER — Encounter: Payer: Self-pay | Admitting: Internal Medicine

## 2011-09-14 ENCOUNTER — Ambulatory Visit (INDEPENDENT_AMBULATORY_CARE_PROVIDER_SITE_OTHER): Payer: BC Managed Care – PPO | Admitting: Internal Medicine

## 2011-09-14 ENCOUNTER — Telehealth: Payer: Self-pay | Admitting: *Deleted

## 2011-09-14 VITALS — BP 126/85 | HR 89 | Temp 98.1°F | Wt 273.0 lb

## 2011-09-14 DIAGNOSIS — R21 Rash and other nonspecific skin eruption: Secondary | ICD-10-CM

## 2011-09-14 DIAGNOSIS — R110 Nausea: Secondary | ICD-10-CM

## 2011-09-14 MED ORDER — HYDROXYZINE HCL 10 MG PO TABS: 10.0000 mg | ORAL_TABLET | Freq: Three times a day (TID) | ORAL | Status: AC | PRN

## 2011-09-14 MED ORDER — ONDANSETRON 4 MG PO TBDP: 4.0000 mg | ORAL_TABLET | Freq: Three times a day (TID) | ORAL | Status: AC | PRN

## 2011-09-14 NOTE — Progress Notes (Signed)
INFECTIOUS DISEASES CLINIC NOTE  RFV: mssa T12 osteo/diskitis  Subjective:    Patient ID: John Brandt, male    DOB: [birth year], 61 y.o.   MRN: [id number]  HPI Mr .Gleissner is a pleasant 61yo Male with PMHX of OSA, NASH, who had new onset  back pain found to have left side inflammation/mass of the T12 vertebra extending along the T11-T12 paraspinal soft tissues.  elective open biopsy by Dr. Dutch Quint on 08/08/11 : Left T12 costotransversectomy with paraspinal biopsy. Micro showed MSSA. He has been on cefazolin 2gm IV Q 8hr for approximately 5 wks.  He has been on a fentanyl patch plus dilaudid for pain management. He noticed having increasing nausea/vomiting and dry heaving since Thursday, 6 days ago. Taking zofran daily is helping. He also has had loss of appetite has been variable, but significantly decreased since last week. He noticed 5 days ago having pruritic rash starting on torso and expanding to legs and arms. Sparing head. He has stopped taking cefazolin and switched to vancomycin as of Monday, 2 days ago. He called since he is worried about his ongoing nausea/vomiting/dry heave. He is still reports fatigue, decrease appetite, weight loss #3-5. 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 He has cut back temporarily due to his symptoms. His rash is slowly improving on his hands but still remains pruritic.  Has lost #30 since May since this started.   Review of Systems   Constitutional:+ appetite change, unexpected weight change Negative for fever, chills, diaphoresis, activity change, fatigue HENT: Negative for congestion, sore throat, rhinorrhea, sneezing, trouble swallowing and sinus pressure.  Eyes: Negative for photophobia and visual disturbance.  Respiratory: Negative for cough, chest tightness, shortness of breath, wheezing and stridor.  Cardiovascular: Negative for chest pain, palpitations and leg swelling.  Gastrointestinal:positive for  nausea, vomiting,but negative for abdominal pain, diarrhea, constipation, blood in stool, abdominal distention and anal bleeding.  Genitourinary: Negative for dysuria, hematuria, flank pain and difficulty urinating.  Musculoskeletal: Negative for myalgias, back pain, joint swelling, arthralgias and gait problem.  Skin: per hpi  Neurological: Negative for dizziness, tremors, weakness and light-headedness.  Hematological: Negative for adenopathy. Does not bruise/bleed easily.  Psychiatric/Behavioral: Negative for behavioral problems, confusion, sleep disturbance, dysphoric mood, decreased concentration and agitation.       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 Dr. Dutch Quint wanted ESR and CRP tomorrow, we will collect today. Based upon values will decide if need to re-image back.  Decreased  appetite = recommended to try ensure BID-TID  Drug allergy rash to cefazolin = will try benadryl cream, bendryl OTC, zyrtec. if that doesn't work will do a trial of atarax.will check cbc with diff, check eos  Nausea= unclear if it is all abtx intolerance. Will have him do scheduled zofran for the next 7 days.  -will have them call back on Friday to see how his symptoms are improving. Call sooner if worsening  rtc  in 1 wk

## 2011-09-14 NOTE — Telephone Encounter (Signed)
Patient wife called and advised his rash has gotten worse and he is still vomiting since he was changed to vancomycin. She is afraid to give him any more of the medicine. After checking with Dr Daiva Eves called the patient wife and advised her to bring him in today at 230 to see Dr Drue Second.

## 2011-09-15 ENCOUNTER — Encounter: Payer: Self-pay | Admitting: Infectious Disease

## 2011-09-15 LAB — CBC WITH DIFFERENTIAL/PLATELET
Basophils Absolute: 0.1 10*3/uL (ref 0.0–0.1)
Basophils Relative: 1 % (ref 0–1)
Eosinophils Absolute: 0.6 10*3/uL (ref 0.0–0.7)
Eosinophils Relative: 7 % — ABNORMAL HIGH (ref 0–5)
Lymphs Abs: 1.2 10*3/uL (ref 0.7–4.0)
MCH: 26 pg (ref 26.0–34.0)
MCHC: 32.6 g/dL (ref 30.0–36.0)
MCV: 79.8 fL (ref 78.0–100.0)
Platelets: 215 10*3/uL (ref 150–400)
RDW: 17 % — ABNORMAL HIGH (ref 11.5–15.5)

## 2011-09-15 LAB — BASIC METABOLIC PANEL
BUN: 9 mg/dL (ref 6–23)
Glucose, Bld: 97 mg/dL (ref 70–99)
Potassium: 4.7 mEq/L (ref 3.5–5.3)

## 2011-09-15 LAB — MAGNESIUM: Magnesium: 1.9 mg/dL (ref 1.5–2.5)

## 2011-09-19 ENCOUNTER — Ambulatory Visit (INDEPENDENT_AMBULATORY_CARE_PROVIDER_SITE_OTHER): Payer: BC Managed Care – PPO | Admitting: Infectious Disease

## 2011-09-19 ENCOUNTER — Encounter: Payer: Self-pay | Admitting: Infectious Disease

## 2011-09-19 VITALS — BP 117/79 | HR 71 | Temp 97.3°F | Ht 73.0 in | Wt 277.0 lb

## 2011-09-19 DIAGNOSIS — M8618 Other acute osteomyelitis, other site: Secondary | ICD-10-CM

## 2011-09-19 DIAGNOSIS — R112 Nausea with vomiting, unspecified: Secondary | ICD-10-CM

## 2011-09-19 DIAGNOSIS — A4101 Sepsis due to Methicillin susceptible Staphylococcus aureus: Secondary | ICD-10-CM | POA: Insufficient documentation

## 2011-09-19 DIAGNOSIS — T50904A Poisoning by unspecified drugs, medicaments and biological substances, undetermined, initial encounter: Secondary | ICD-10-CM

## 2011-09-19 DIAGNOSIS — R222 Localized swelling, mass and lump, trunk: Secondary | ICD-10-CM

## 2011-09-19 DIAGNOSIS — R29818 Other symptoms and signs involving the nervous system: Secondary | ICD-10-CM

## 2011-09-19 DIAGNOSIS — L270 Generalized skin eruption due to drugs and medicaments taken internally: Secondary | ICD-10-CM

## 2011-09-19 DIAGNOSIS — M4620 Osteomyelitis of vertebra, site unspecified: Secondary | ICD-10-CM | POA: Insufficient documentation

## 2011-09-19 NOTE — Assessment & Plan Note (Signed)
Not sure this is related to his narcotics or to his adverse drug reaction which is now resolving. And taking him off antibiotics altogether certainly they cannot be blamed all for his symptoms.

## 2011-09-19 NOTE — Assessment & Plan Note (Signed)
Resolving

## 2011-09-19 NOTE — Progress Notes (Signed)
RN received verbal order to discontinue the patient's PICC line.  Patient identified with name and date of birth. PICC dressing removed, site unremarkable.  PICC line removed using sterile procedure @ 1430. PICC length equal to that noted in patient's hospital chart of 45 cm. Sterile petroleum gauze + sterile 4X4 applied to PICC site, pressure applied for 10 minutes and covered with Medipore tape as a pressure dressing. Patient tolerated procedure without complaints.  Patient instructed to limit use of arm for 1 hour. Patient instructed that the pressure dressing should remain in place for 24 hours. Patient verbalized understanding of these instructions. 

## 2011-09-19 NOTE — Assessment & Plan Note (Signed)
Status post surgery and 6 weeks of postoperative effective antibiotics for methicillin sensitive staph aureus infection.

## 2011-09-19 NOTE — Progress Notes (Signed)
Subjective:    Patient ID: John Brandt, male    DOB: 1950-10-10, 61 y.o.   MRN: [id number]  HPI  [name] is a34yo Male with PMHX of OSA, NASH, who had new onset  back pain found to have left side inflammation/mass of the T12 vertebra extending along the T11-T12 paraspinal soft tissues. elective open biopsy by Dr. Dutch Quint on 08/08/11 : Left T12 costotransversectomy with paraspinal biopsy. Micro showed MSSA. He hadbeen on cefazolin 2gm IV Q 8hr for approximately 5 wks. He has been on a fentanyl patch plus dilaudid for pain management. He noticed having increasing nausea/vomiting and dry heaving and then a severe maculopapular rash. Cefazolin was changed to vancomycin but nausea persisted and he came into the infectious disease clinic last Monday and was seen by my partner Dr. Ilsa Iha. He is managed with symptomatic therapy with Zofran. Since then nausea has improved although his appetite still is poor. Inflammatory markers were rechecked and the sedimentation rate and C-reactive protein are down trending. He is now completed 6 weeks of postoperative IV antibiotics. He hasn't some pain with weightbearing and walking but generally no pain at rest. He had no fevers or other systemic symptoms.  We discussed the nature of staph Cox aureus infection and colonization and discussed possible decolonization regimen for he and his wife upon return visit. I for comfortable this point time stopping his anti-biotics and observing him. I spent greater than 45 minutes with the patient including greater than 50% of time in face to face counsel of the patient and in coordination of their care.    Review of Systems  Constitutional: Positive for appetite change and fatigue. Negative for fever, chills, diaphoresis, activity change and unexpected weight change.  HENT: Negative for congestion, sore throat, rhinorrhea, sneezing, trouble swallowing and sinus pressure.   Eyes: Negative for photophobia and visual  disturbance.  Respiratory: Negative for cough, chest tightness, shortness of breath, wheezing and stridor.   Cardiovascular: Negative for chest pain, palpitations and leg swelling.  Gastrointestinal: Negative for nausea, vomiting, abdominal pain, diarrhea, constipation, blood in stool, abdominal distention and anal bleeding.  Genitourinary: Negative for dysuria, hematuria, flank pain and difficulty urinating.  Musculoskeletal: Negative for myalgias, back pain, joint swelling, arthralgias and gait problem.  Skin: Positive for rash. Negative for color change, pallor and wound.  Neurological: Negative for dizziness, tremors, weakness and light-headedness.  Hematological: Negative for adenopathy. Does not bruise/bleed easily.  Psychiatric/Behavioral: Negative for behavioral problems, confusion, disturbed wake/sleep cycle, dysphoric mood, decreased concentration and agitation.       Objective:   Physical Exam  Constitutional: He is oriented to person, place, and time. He appears well-developed and well-nourished. No distress.  HENT:  Head: Normocephalic and atraumatic.  Mouth/Throat: Oropharynx is clear and moist. No oropharyngeal exudate.  Eyes: Conjunctivae normal and EOM are normal. Pupils are equal, round, and reactive to light. No scleral icterus.  Neck: Normal range of motion. Neck supple. No JVD present.  Cardiovascular: Normal rate, regular rhythm and normal heart sounds.  Exam reveals no gallop and no friction rub.   No murmur heard. Pulmonary/Chest: Effort normal and breath sounds normal. No respiratory distress. He has no wheezes. He has no rales. He exhibits no tenderness.  Abdominal: He exhibits no distension and no mass. There is no tenderness. There is no rebound and no guarding.  Musculoskeletal: He exhibits no edema and no tenderness.  Lymphadenopathy:    He has no cervical adenopathy.  Neurological: He is alert and oriented  to person, place, and time. He has normal reflexes.  He exhibits normal muscle tone. Coordination normal.  Skin: Skin is warm and dry. He is not diaphoretic. No erythema. No pallor.     Psychiatric: He has a normal mood and affect. His behavior is normal. Judgment and thought content normal.          Assessment & Plan:  MSSA (methicillin susceptible Staphylococcus aureus) septicemia Will offer decolonization regimen in the future.  Drug rash Resolving.  Nausea & vomiting Not sure this is related to his narcotics or to his adverse drug reaction which is now resolving. And taking him off antibiotics altogether certainly they cannot be blamed all for his symptoms.  Paraspinal abscess Status post surgery and 6 weeks of postoperative effective antibiotics for methicillin sensitive staph aureus infection.

## 2011-09-19 NOTE — Assessment & Plan Note (Signed)
Will offer decolonization regimen in the future.

## 2011-09-20 LAB — AFB CULTURE WITH SMEAR (NOT AT ARMC): Acid Fast Smear: NONE SEEN

## 2011-10-02 ENCOUNTER — Encounter (HOSPITAL_COMMUNITY): Payer: Self-pay | Admitting: *Deleted

## 2011-10-02 ENCOUNTER — Inpatient Hospital Stay (HOSPITAL_COMMUNITY)
Admission: EM | Admit: 2011-10-02 | Discharge: 2011-10-08 | DRG: 744 | Disposition: A | Discharge status: Home / Self Care | Payer: BC Managed Care – PPO | Attending: Internal Medicine | Admitting: Internal Medicine

## 2011-10-02 DIAGNOSIS — F19939 Other psychoactive substance use, unspecified with withdrawal, unspecified: Principal | ICD-10-CM | POA: Diagnosis present

## 2011-10-02 DIAGNOSIS — R790 Abnormal level of blood mineral: Secondary | ICD-10-CM

## 2011-10-02 DIAGNOSIS — D72829 Elevated white blood cell count, unspecified: Secondary | ICD-10-CM | POA: Diagnosis present

## 2011-10-02 DIAGNOSIS — Z79899 Other long term (current) drug therapy: Secondary | ICD-10-CM

## 2011-10-02 DIAGNOSIS — K760 Fatty (change of) liver, not elsewhere classified: Secondary | ICD-10-CM

## 2011-10-02 DIAGNOSIS — F1123 Opioid dependence with withdrawal: Secondary | ICD-10-CM

## 2011-10-02 DIAGNOSIS — G4700 Insomnia, unspecified: Secondary | ICD-10-CM

## 2011-10-02 DIAGNOSIS — E860 Dehydration: Secondary | ICD-10-CM | POA: Diagnosis present

## 2011-10-02 DIAGNOSIS — M545 Low back pain, unspecified: Secondary | ICD-10-CM | POA: Diagnosis present

## 2011-10-02 DIAGNOSIS — L270 Generalized skin eruption due to drugs and medicaments taken internally: Secondary | ICD-10-CM

## 2011-10-02 DIAGNOSIS — F1120 Opioid dependence, uncomplicated: Secondary | ICD-10-CM

## 2011-10-02 DIAGNOSIS — R001 Bradycardia, unspecified: Secondary | ICD-10-CM

## 2011-10-02 DIAGNOSIS — R000 Tachycardia, unspecified: Secondary | ICD-10-CM

## 2011-10-02 DIAGNOSIS — G4733 Obstructive sleep apnea (adult) (pediatric): Secondary | ICD-10-CM | POA: Diagnosis present

## 2011-10-02 DIAGNOSIS — N179 Acute kidney failure, unspecified: Secondary | ICD-10-CM | POA: Diagnosis present

## 2011-10-02 DIAGNOSIS — E876 Hypokalemia: Secondary | ICD-10-CM

## 2011-10-02 DIAGNOSIS — Z23 Encounter for immunization: Secondary | ICD-10-CM

## 2011-10-02 DIAGNOSIS — Z7982 Long term (current) use of aspirin: Secondary | ICD-10-CM

## 2011-10-02 DIAGNOSIS — M4620 Osteomyelitis of vertebra, site unspecified: Secondary | ICD-10-CM

## 2011-10-02 DIAGNOSIS — A4101 Sepsis due to Methicillin susceptible Staphylococcus aureus: Secondary | ICD-10-CM

## 2011-10-02 DIAGNOSIS — T50904A Poisoning by unspecified drugs, medicaments and biological substances, undetermined, initial encounter: Secondary | ICD-10-CM

## 2011-10-02 DIAGNOSIS — R222 Localized swelling, mass and lump, trunk: Secondary | ICD-10-CM

## 2011-10-02 DIAGNOSIS — I10 Essential (primary) hypertension: Secondary | ICD-10-CM | POA: Diagnosis present

## 2011-10-02 DIAGNOSIS — G8929 Other chronic pain: Secondary | ICD-10-CM | POA: Diagnosis present

## 2011-10-02 DIAGNOSIS — Z87898 Personal history of other specified conditions: Secondary | ICD-10-CM

## 2011-10-02 DIAGNOSIS — R112 Nausea with vomiting, unspecified: Secondary | ICD-10-CM

## 2011-10-02 HISTORY — DX: Dorsalgia, unspecified: M54.9

## 2011-10-02 HISTORY — DX: Discitis, unspecified, site unspecified: M46.40

## 2011-10-02 HISTORY — DX: Osteomyelitis of vertebra, site unspecified: M46.20

## 2011-10-02 HISTORY — DX: Other chronic pain: G89.29

## 2011-10-02 LAB — CBC WITH DIFFERENTIAL/PLATELET
Basophils Absolute: 0 10*3/uL (ref 0.0–0.1)
Basophils Relative: 0 % (ref 0–1)
Eosinophils Absolute: 0 10*3/uL (ref 0.0–0.7)
Eosinophils Relative: 0 % (ref 0–5)
HCT: 38.9 % — ABNORMAL LOW (ref 39.0–52.0)
MCHC: 33.4 g/dL (ref 30.0–36.0)
MCV: 78.4 fL (ref 78.0–100.0)
Monocytes Absolute: 0.7 10*3/uL (ref 0.1–1.0)
Neutro Abs: 6.8 10*3/uL (ref 1.7–7.7)
RDW: 15.9 % — ABNORMAL HIGH (ref 11.5–15.5)

## 2011-10-02 LAB — BASIC METABOLIC PANEL
BUN: 11 mg/dL (ref 6–23)
Chloride: 101 mEq/L (ref 96–112)
Creatinine, Ser: 1.37 mg/dL — ABNORMAL HIGH (ref 0.50–1.35)
GFR calc Af Amer: 62 mL/min — ABNORMAL LOW (ref >90)

## 2011-10-02 MED ORDER — ONDANSETRON HCL 4 MG/2ML IJ SOLN
4.0000 mg | Freq: Four times a day (QID) | INTRAMUSCULAR | Status: DC | PRN
  Administered 2011-10-02: 4 mg via INTRAVENOUS
  Filled 2011-10-02: qty 2

## 2011-10-02 MED ORDER — SPIRONOLACTONE 25 MG PO TABS
25.0000 mg | ORAL_TABLET | Freq: Every day | ORAL | Status: DC
  Administered 2011-10-03 – 2011-10-08 (×7): 25 mg via ORAL
  Filled 2011-10-02 (×7): qty 1

## 2011-10-02 MED ORDER — HYDRALAZINE HCL 20 MG/ML IJ SOLN
10.0000 mg | Freq: Four times a day (QID) | INTRAMUSCULAR | Status: DC | PRN
  Administered 2011-10-02 – 2011-10-05 (×3): 10 mg via INTRAVENOUS
  Filled 2011-10-02 (×3): qty 1

## 2011-10-02 MED ORDER — ONDANSETRON HCL 4 MG/2ML IJ SOLN
4.0000 mg | Freq: Once | INTRAMUSCULAR | Status: AC
  Administered 2011-10-02: 4 mg via INTRAVENOUS
  Filled 2011-10-02: qty 2

## 2011-10-02 MED ORDER — LORAZEPAM 2 MG/ML IJ SOLN
1.0000 mg | INTRAMUSCULAR | Status: DC | PRN
  Administered 2011-10-04 – 2011-10-05 (×3): 1 mg via INTRAVENOUS
  Filled 2011-10-02 (×2): qty 1

## 2011-10-02 MED ORDER — SODIUM CHLORIDE 0.9 % IV SOLN: INTRAVENOUS | Status: DC

## 2011-10-02 MED ORDER — METOPROLOL SUCCINATE ER 50 MG PO TB24
50.0000 mg | ORAL_TABLET | Freq: Every day | ORAL | Status: DC
  Administered 2011-10-03 (×2): 50 mg via ORAL
  Filled 2011-10-02 (×3): qty 1

## 2011-10-02 MED ORDER — ONDANSETRON HCL 4 MG/2ML IJ SOLN
INTRAMUSCULAR | Status: AC
  Filled 2011-10-02: qty 2

## 2011-10-02 MED ORDER — ATORVASTATIN CALCIUM 10 MG PO TABS
10.0000 mg | ORAL_TABLET | Freq: Every day | ORAL | Status: DC
  Administered 2011-10-02 – 2011-10-08 (×7): 10 mg via ORAL
  Filled 2011-10-02 (×7): qty 1

## 2011-10-02 MED ORDER — POTASSIUM CHLORIDE CRYS ER 20 MEQ PO TBCR
40.0000 meq | EXTENDED_RELEASE_TABLET | Freq: Once | ORAL | Status: AC
  Administered 2011-10-02: 40 meq via ORAL
  Filled 2011-10-02: qty 2

## 2011-10-02 MED ORDER — ASPIRIN EC 81 MG PO TBEC
81.0000 mg | DELAYED_RELEASE_TABLET | Freq: Every day | ORAL | Status: DC
  Administered 2011-10-03 – 2011-10-08 (×7): 81 mg via ORAL
  Filled 2011-10-02 (×7): qty 1

## 2011-10-02 MED ORDER — LIDOCAINE 5 % EX PTCH
1.0000 | MEDICATED_PATCH | CUTANEOUS | Status: DC
  Administered 2011-10-02 – 2011-10-07 (×6): 1 via TRANSDERMAL
  Filled 2011-10-02 (×7): qty 1

## 2011-10-02 MED ORDER — POTASSIUM CHLORIDE 10 MEQ/100ML IV SOLN
10.0000 meq | Freq: Once | INTRAVENOUS | Status: AC
  Administered 2011-10-02: 10 meq via INTRAVENOUS
  Filled 2011-10-02: qty 100

## 2011-10-02 MED ORDER — METHADONE HCL 10 MG/ML IJ SOLN
10.0000 mg | Freq: Once | INTRAMUSCULAR | Status: AC
  Administered 2011-10-02: 10 mg via INTRAMUSCULAR
  Filled 2011-10-02: qty 1

## 2011-10-02 MED ORDER — FENTANYL CITRATE 0.05 MG/ML IJ SOLN
50.0000 ug | Freq: Once | INTRAMUSCULAR | Status: AC
  Administered 2011-10-02: 50 ug via INTRAVENOUS
  Filled 2011-10-02: qty 2

## 2011-10-02 MED ORDER — AMLODIPINE BESYLATE 10 MG PO TABS
10.0000 mg | ORAL_TABLET | Freq: Every day | ORAL | Status: DC
  Administered 2011-10-03 – 2011-10-08 (×7): 10 mg via ORAL
  Filled 2011-10-02 (×7): qty 1

## 2011-10-02 MED ORDER — POTASSIUM CHLORIDE IN NACL 20-0.9 MEQ/L-% IV SOLN
INTRAVENOUS | Status: DC
  Administered 2011-10-02 – 2011-10-03 (×3): via INTRAVENOUS
  Administered 2011-10-04 (×2): 150 mL/h via INTRAVENOUS
  Administered 2011-10-05: 03:00:00 via INTRAVENOUS
  Administered 2011-10-06: 1000 mL via INTRAVENOUS
  Administered 2011-10-06 – 2011-10-07 (×3): via INTRAVENOUS
  Filled 2011-10-02 (×21): qty 1000

## 2011-10-02 MED ORDER — POLYETHYLENE GLYCOL 3350 17 G PO PACK
17.0000 g | PACK | Freq: Every day | ORAL | Status: DC | PRN
  Filled 2011-10-02: qty 1

## 2011-10-02 MED ORDER — CLONIDINE HCL 0.2 MG PO TABS
0.2000 mg | ORAL_TABLET | Freq: Three times a day (TID) | ORAL | Status: DC
  Administered 2011-10-03 – 2011-10-08 (×15): 0.2 mg via ORAL
  Filled 2011-10-02 (×19): qty 1

## 2011-10-02 MED ORDER — PANTOPRAZOLE SODIUM 40 MG IV SOLR
40.0000 mg | INTRAVENOUS | Status: DC
  Administered 2011-10-02 – 2011-10-03 (×2): 40 mg via INTRAVENOUS
  Filled 2011-10-02 (×3): qty 40

## 2011-10-02 MED ORDER — ONDANSETRON HCL 4 MG PO TABS: 4.0000 mg | ORAL_TABLET | Freq: Four times a day (QID) | ORAL | Status: DC | PRN

## 2011-10-02 MED ORDER — FLUTICASONE PROPIONATE 50 MCG/ACT NA SUSP
2.0000 | Freq: Every day | NASAL | Status: DC
  Administered 2011-10-03 – 2011-10-08 (×6): 2 via NASAL
  Filled 2011-10-02: qty 16

## 2011-10-02 MED ORDER — SODIUM CHLORIDE 0.9 % IV SOLN
INTRAVENOUS | Status: DC
  Administered 2011-10-02: 14:00:00 via INTRAVENOUS

## 2011-10-02 NOTE — Progress Notes (Signed)
Disposition note  61 y/o male with morbidity obesity, HTN, recent paraspinal abscess (08/2011) treated with surgery by Dr. Dutch Quint and 6 weeks of antibiotics by Dr. Daiva Eves; came to the hospital complaining of shaking, chills, nausea/vomiting (intractable) and also diarrhea. No frank fever, no cough, no CP, no abdominal pain and no dysuria. Constellation of symptoms appeared to be related to withdrawal from fentanyl. In the Ed work up demonstrated dehydration with volume contraction, hypokalemia and ARF.  Received 1L of fluids in the Ed and also of potassium PO and IV as part of repletion therapy for his electrolytes. Will admit to telemetry bed.  Of note; patient PCP is Dr. Roselie Awkward.  Margareth Kanner [phone]

## 2011-10-02 NOTE — H&P (Signed)
History and Physical Examination  Date: 10/02/2011  Patient name: John Brandt Medical record number: [id number] Date of birth: November 03, 1950 Age: 61 y.o. Gender: male PCP: Hollice Espy, MD Neurosurgeon: Dr. Dutch Quint Infectious Diseases: Drs Daiva Eves and Drue Second  Chief Complaint:  Chief Complaint  Patient presents with  . Shaking  . Chills  . Nausea  . Emesis  . Withdrawal    From Fentanyl Patch     History of Present Illness: John Brandt is an 61 y.o. male with PMHX of OSA, NASH, who had new onset back pain found to have left side inflammation/mass of the T12 vertebra extending along the T11-T12 paraspinal soft tissues who had an elective open biopsy by Dr. Dutch Quint on 08/08/11 and had a left T12 costotransversectomy with paraspinal biopsy. Micro showed MSSA. He was treated with home IV PICC cefazolin for approximately 5 wks and developed a rash and received an additional week of vancomycin for a total of 6 weeks of treatment. He has been on a fentanyl patch plus dilaudid for pain management as prescribed by Dr. Dutch Quint.  Approximately 2 weeks ago he was started on a rapid taper of the fentanyl patch.  The patient was told to wear the 50 mcg patch every other day for several days and then switch to the 25 mcg patch for several days.  The patient reports that when he switched down to the 25 mcg patches he began to develop symptoms of severe nausea and vomiting, chills, tremors, malaise, worsening back pain, and not able to eat or drink without significant vomiting.  He has not been able to keep down anything for the past 3 days.  He came to the ED today and was found to be clinically dehydrated, tachycardic, and developing renal insufficiency.  He was vomiting profusely and reporting back pain.  He was started on IVFs and IV pain meds and hospitalization was requested.    Past Medical History Past Medical History  Diagnosis Date  . Hypertension   . Hyperlipidemia   . Allergic  rhinitis   . Hx of cardiac cath   . Complication of anesthesia   . PONV (postoperative nausea and vomiting)   . Sleep apnea     sleep study 07-02-02  . Chronic back pain   . 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 Vasectomy   . Dental implants   . Cardiac catheterization     more than 15 yrs ago  . Back surgery     Home Meds: Prior to Admission medications   Medication Sig Start Date End Date Taking? Authorizing Provider  acetaminophen (TYLENOL) 500 MG tablet Take 1,000 mg by mouth every 6 (six) hours as needed. For pain   Yes Historical Provider, MD  amLODipine (NORVASC) 10 MG tablet Take 10 mg by mouth daily.  01/22/11  Yes Historical Provider, MD  aspirin EC 81 MG tablet Take 81 mg by mouth daily.   Yes Historical Provider, MD  atorvastatin (LIPITOR) 10 MG tablet Take 10 mg by mouth daily.  12/19/10  Yes Historical Provider, MD  cloNIDine (CATAPRES) 0.2 MG tablet Take 1 tablet (0.2 mg total) by mouth 3 (three) times daily. Hold unless BP > 140 when checked 07/02/11  Yes Jessica U Vann, DO  diazepam (VALIUM) 5 MG tablet Take 5-10 mg  by mouth every 6 (six) hours as needed. As needed for pain or sleep   Yes Historical Provider, MD  fentaNYL (DURAGESIC - DOSED MCG/HR) 25 MCG/HR Place 1 patch onto the skin every other day.   Yes Historical Provider, MD  metoprolol succinate (TOPROL-XL) 50 MG 24 hr tablet Take 50 mg by mouth daily. Take with or immediately following a meal.   Yes Historical Provider, MD  Multiple Vitamin (MULTIVITAMIN WITH MINERALS) TABS Take 1 tablet by mouth daily.   Yes Historical Provider, MD  ondansetron (ZOFRAN) 8 MG tablet Take 8 mg by mouth every 8 (eight) hours as needed. For nausea.   Yes Historical Provider, MD  polyethylene glycol (MIRALAX / GLYCOLAX) packet Take 17 g by mouth daily as needed. For constipation.   Yes  Historical Provider, MD  potassium chloride SA (K-DUR,KLOR-CON) 20 MEQ tablet Take 40 mEq by mouth 3 (three) times daily.   Yes Historical Provider, MD  spironolactone (ALDACTONE) 25 MG tablet Take 25 mg by mouth daily.  02/28/11  Yes Historical Provider, MD  triamcinolone (NASACORT) 55 MCG/ACT nasal inhaler Place 2 sprays into the nose daily. 2 sprays in each nostril daily 12/15/10  Yes Historical Provider, MD  Vitamin D, Ergocalciferol, (DRISDOL) 50000 UNITS CAPS Take 50,000 Units by mouth every 7 (seven) days. Taken on Saturdays. 02/26/11  Yes Historical Provider, MD   Allergies: Fish-derived products; Cefazolin; Iodine; and Penicillins  Social History:  History   Social History  . Marital Status: Married    Spouse Name: N/A    Number of Children: N/A  . 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 Smoking status: Never Smoker   . Smokeless tobacco: Never Used  . Alcohol Use: Yes     rare use  . Drug Use: No  . Sexually Active: Not on file   Other Topics Concern  . Not on file   Social History Narrative  . No narrative on file   Family History:  Family History  Problem Relation Age of Onset  . Allergies Mother   . Heart disease Mother   . Lung cancer Mother     mesothelioma  . Aneurysm Brother     brain  . Heart disease Father   . Lung cancer Father     Review of Systems: Pertinent items are noted in HPI. All other systems reviewed and reported as negative.   Physical Exam: Blood pressure 133/81, pulse 88, temperature 98.1 F (36.7 C), temperature source Oral, resp. rate 16, SpO2 100.00%. General appearance: alert, cooperative, appears stated age, fatigued, flushed, mild distress and moderately obese Head: Normocephalic, without obvious abnormality, atraumatic Eyes: dry mucous membranees Nose: mild congestion, turbinates edematous Throat: dry membranes Neck: no adenopathy, no carotid bruit, no JVD,  supple, symmetrical, trachea midline and thyroid not enlarged, symmetric, no tenderness/mass/nodules Back: midline lumbar scar well healed, tenderness right lower spine paraspinal muscles Lungs: clear to auscultation bilaterally and normal percussion bilaterally Chest wall: no tenderness Heart: S1, S2 normal and tachycardic Abdomen: soft, non-tender; bowel sounds normal; no masses,  no organomegaly Extremities: extremities normal, atraumatic, no cyanosis or edema Pulses: 2+ and symmetric Skin: Skin color, texture, turgor normal. No rashes or lesions Neurologic: Grossly normal  Lab  And Imaging results:  Results for orders placed during the hospital encounter of 10/02/11 (from the past 24 hour(s))  BASIC METABOLIC PANEL     Status: Abnormal   Collection Time  10/02/11  1:46 PM      Component Value Range   Sodium 139  135 - 145 mEq/L   Potassium 2.9 (*) 3.5 - 5.1 mEq/L   Chloride 101  96 - 112 mEq/L   CO2 20  19 - 32 mEq/L   Glucose, Bld 106 (*) 70 - 99 mg/dL   BUN 11  6 - 23 mg/dL   Creatinine, Ser 6.21 (*) 0.50 - 1.35 mg/dL   Calcium 9.5  8.4 - 30.8 mg/dL   GFR calc non Af Amer 54 (*) >90 mL/min   GFR calc Af Amer 62 (*) >90 mL/min  CBC WITH DIFFERENTIAL     Status: Abnormal   Collection Time   10/02/11  1:46 PM      Component Value Range   WBC 8.4  4.0 - 10.5 K/uL   RBC 4.96  4.22 - 5.81 MIL/uL   Hemoglobin 13.0  13.0 - 17.0 g/dL   HCT 65.7 (*) 84.6 - 96.2 %   MCV 78.4  78.0 - 100.0 fL   MCH 26.2  26.0 - 34.0 pg   MCHC 33.4  30.0 - 36.0 g/dL   RDW 95.2 (*) 84.1 - 32.4 %   Platelets 262  150 - 400 K/uL   Neutrophils Relative 82 (*) 43 - 77 %   Neutro Abs 6.8  1.7 - 7.7 K/uL   Lymphocytes Relative 10 (*) 12 - 46 %   Lymphs Abs 0.9  0.7 - 4.0 K/uL   Monocytes Relative 8  3 - 12 %   Monocytes Absolute 0.7  0.1 - 1.0 K/uL   Eosinophils Relative 0  0 - 5 %   Eosinophils Absolute 0.0  0.0 - 0.7 K/uL   Basophils Relative 0  0 - 1 %   Basophils Absolute 0.0  0.0 - 0.1 K/uL      Impression   Nausea & vomiting - intractable  Hypokalemia  ARF (acute renal failure)  Acute Narcotic withdrawal  Dehydration  Tachycardia  Chronic low back pain  Hypertension   Plan  Admit for symptom management and to get patient weaned off the narcotics.  He says that he strongly desires to get off the patches and narcotics.  Provide IVFs for adequate hydration, monitor and replace electrolytes, IV nausea meds, IV protonix, Lidoderm patch to back, PT consult, K-pad, clonidine if needed for symptoms, IV lorazepam for symptoms as needed, close monitoring, follow labs, Please see orders and follow hospital course.   Standley Dakins MD [facility] [place], [place] [phone] 10/02/2011, 5:52 PM

## 2011-10-02 NOTE — ED Provider Notes (Addendum)
History     CSN: [id number]  Arrival date & time 10/02/11  1231   First MD Initiated Contact with Patient 10/02/11 1318      Chief Complaint  Patient presents with  . Shaking  . Chills  . Nausea  . Emesis  . Withdrawal    From Fentanyl Patch    (Consider location/radiation/quality/duration/timing/severity/associated sxs/prior treatment) Patient is a 61 y.o. male presenting with vomiting. The history is provided by the patient and the spouse.  Emesis  Associated symptoms include chills. Pertinent negatives include no abdominal pain, no cough and no fever.  77 y male with hx of T12 paravertebral abscess txd surgically by dr. Dutch Quint and with abxs by dr. Zenaida Niece damm presents to the ed with chills, tremor and n/v.  He had been on fentanyl patches at 100 mcg, then 50 mcg qod, and now 25 mcg qod. Since he decreased to the 25 mcg strength, he has had these sxs. He denies cough, fever, sweating.  His back pain has not increased in severity. Level 5 caveat for severe pain and urgent need for intervention.  Past Medical History  Diagnosis Date  . Hypertension   . Hyperlipidemia   . Allergic rhinitis   . Hx of cardiac cath   . Complication of anesthesia   . PONV (postoperative nausea and vomiting)   . Sleep apnea     sleep study 07-02-02    Past Surgical History  Procedure Date  . Vasectomy   . Dental implants   . Cardiac catheterization     more than 15 yrs ago  . Back surgery     Family History  Problem Relation Age of Onset  . Allergies Mother   . Heart disease Mother   . Lung cancer Mother     mesothelioma  . Aneurysm Brother     brain  . Heart disease Father   . Lung cancer Father     History  Substance Use Topics  . Smoking status: Never Smoker   . Smokeless tobacco: Never Used  . Alcohol Use: Yes     rare use      Review of Systems  Constitutional: Positive for chills. Negative for fever and diaphoresis.  Respiratory: Negative for cough and shortness of  breath.   Cardiovascular: Negative for chest pain.  Gastrointestinal: Positive for nausea and vomiting. Negative for abdominal pain.  Musculoskeletal: Positive for back pain.  Neurological: Negative for weakness.  Hematological: Does not bruise/bleed easily.  Psychiatric/Behavioral: Negative for confusion.    Allergies  Fish-derived products; Cefazolin; Iodine; and Penicillins  Home Medications   Current Outpatient Rx  Name Route Sig Dispense Refill  . ACETAMINOPHEN 500 MG PO TABS Oral Take 1,000 mg by mouth every 6 (six) hours as needed. For pain    . AMLODIPINE BESYLATE 10 MG PO TABS Oral Take 10 mg by mouth daily.     . ASPIRIN EC 81 MG PO TBEC Oral Take 81 mg by mouth daily.    . ATORVASTATIN CALCIUM 10 MG PO TABS Oral Take 10 mg by mouth daily.     Marland Kitchen CLONIDINE HCL 0.2 MG PO TABS Oral Take 1 tablet (0.2 mg total) by mouth 3 (three) times daily. Hold unless BP > 140 when checked    . DIAZEPAM 5 MG PO TABS Oral Take 5-10 mg by mouth every 6 (six) hours as needed. As needed for pain or sleep    . FENTANYL 25 MCG/HR TD PT72 Transdermal Place 1 patch onto  the skin every other day.    Marland Kitchen METOPROLOL SUCCINATE ER 50 MG PO TB24 Oral Take 50 mg by mouth daily. Take with or immediately following a meal.    . ADULT MULTIVITAMIN W/MINERALS CH Oral Take 1 tablet by mouth daily.    Marland Kitchen ONDANSETRON HCL 8 MG PO TABS Oral Take 8 mg by mouth every 8 (eight) hours as needed. For nausea.    Marland Kitchen POLYETHYLENE GLYCOL 3350 PO PACK Oral Take 17 g by mouth daily as needed. For constipation.    Marland Kitchen POTASSIUM CHLORIDE CRYS ER 20 MEQ PO TBCR Oral Take 40 mEq by mouth 3 (three) times daily.    Marland Kitchen SPIRONOLACTONE 25 MG PO TABS Oral Take 25 mg by mouth daily.     . TRIAMCINOLONE ACETONIDE 55 MCG/ACT NA INHA Nasal Place 2 sprays into the nose daily. 2 sprays in each nostril daily    . VITAMIN D (ERGOCALCIFEROL) 50000 UNITS PO CAPS Oral Take 50,000 Units by mouth every 7 (seven) days. Taken on Saturdays.      BP 166/98   Pulse 106  Temp 98.1 F (36.7 C) (Oral)  Resp 24  SpO2 100%  Physical Exam  Nursing note and vitals reviewed. Constitutional: He is oriented to person, place, and time.       Morbidly obese ill appearing  HENT:  Head: Normocephalic and atraumatic.  Eyes: Conjunctivae normal are normal.  Neck: Normal range of motion. Neck supple.  Cardiovascular: Regular rhythm and intact distal pulses.   No murmur heard.      Tachycardia  Pulmonary/Chest: Effort normal and breath sounds normal. No respiratory distress.       Tachypnea  Abdominal: Soft. Bowel sounds are normal. He exhibits no distension. There is no tenderness.  Musculoskeletal: Normal range of motion. He exhibits no edema and no tenderness.       No tenderness over the healed wound from prior surgery.  At the T12 level.  No signs of overlying infection  Neurological: He is alert and oriented to person, place, and time. No cranial nerve deficit.  Skin: Skin is warm and dry. No rash noted. No erythema.  Psychiatric: He has a normal mood and affect. Thought content normal.    ED Course  Procedures (including critical care time) 61 year old, male, with history of T12.  Paravertebral abscess treated with surgery, and antibiotics, presents emergency department with chills, nausea, and vomiting.  After he decreased his fentanyl patches.  He has a mild tachycardia, but no tenderness over the surgical wound.  We will perform laboratory testing, to for signs of infection, and establish an IV to replace the narcotics, that he had been on for an extended period of time   Labs Reviewed  BASIC METABOLIC PANEL  CBC WITH DIFFERENTIAL   No results found.   No diagnosis found.  4:03 PM Spoke with [facility].  He will admit to tx withdrawal, hypokalemia, renal insufficiency from dehydration due to vomiting.  MDM  Narcotic withdrawal        Cheri Guppy, MD 10/02/11 1335  Cheri Guppy, MD 10/02/11 [phone]

## 2011-10-02 NOTE — ED Notes (Signed)
Pt reports he is tapering his fentanyl patches using 100 mcgs intitially and decreased down to 25 mcg on Thursday.  Pt initially placed on patches at the beginning of July after a spinal surgery.  Pt being monitored by MD Dutch Quint. Pt was on 50 mcg the prior week. Pt reports nausea, chills, shaking worse yesterday and today.  Pt family concerned for withdrawal symptoms.

## 2011-10-03 ENCOUNTER — Encounter (HOSPITAL_COMMUNITY): Payer: Self-pay

## 2011-10-03 DIAGNOSIS — D72829 Elevated white blood cell count, unspecified: Secondary | ICD-10-CM

## 2011-10-03 LAB — COMPREHENSIVE METABOLIC PANEL
ALT: 11 U/L (ref 0–53)
AST: 17 U/L (ref 0–37)
Albumin: 3.5 g/dL (ref 3.5–5.2)
Alkaline Phosphatase: 91 U/L (ref 39–117)
Chloride: 103 mEq/L (ref 96–112)
Potassium: 3 mEq/L — ABNORMAL LOW (ref 3.5–5.1)
Sodium: 141 mEq/L (ref 135–145)
Total Bilirubin: 0.5 mg/dL (ref 0.3–1.2)
Total Protein: 7.6 g/dL (ref 6.0–8.3)

## 2011-10-03 LAB — CBC
HCT: 36.5 % — ABNORMAL LOW (ref 39.0–52.0)
MCH: 26.3 pg (ref 26.0–34.0)
MCV: 79.9 fL (ref 78.0–100.0)
Platelets: 244 10*3/uL (ref 150–400)
RDW: 16.4 % — ABNORMAL HIGH (ref 11.5–15.5)

## 2011-10-03 MED ORDER — ACETAMINOPHEN 325 MG PO TABS
650.0000 mg | ORAL_TABLET | Freq: Four times a day (QID) | ORAL | Status: DC | PRN
  Administered 2011-10-03 – 2011-10-07 (×4): 650 mg via ORAL
  Filled 2011-10-03 (×4): qty 2

## 2011-10-03 MED ORDER — PROMETHAZINE HCL 25 MG/ML IJ SOLN
12.5000 mg | Freq: Four times a day (QID) | INTRAMUSCULAR | Status: DC | PRN
  Administered 2011-10-03 – 2011-10-05 (×6): 12.5 mg via INTRAVENOUS
  Filled 2011-10-03 (×6): qty 1

## 2011-10-03 MED ORDER — INFLUENZA VIRUS VACC SPLIT PF IM SUSP
0.5000 mL | INTRAMUSCULAR | Status: AC
  Administered 2011-10-04: 0.5 mL via INTRAMUSCULAR
  Filled 2011-10-03: qty 0.5

## 2011-10-03 MED ORDER — POTASSIUM CHLORIDE CRYS ER 20 MEQ PO TBCR
40.0000 meq | EXTENDED_RELEASE_TABLET | Freq: Once | ORAL | Status: AC
  Administered 2011-10-03: 40 meq via ORAL
  Filled 2011-10-03: qty 2

## 2011-10-03 NOTE — Progress Notes (Signed)
Placed patient on CPAP of 12cmh2o per patients home settings with nasal mask at 2230. Patient tolerating well. Put sterile water in humidifier.

## 2011-10-03 NOTE — Progress Notes (Signed)
Patient placed on CPAP 12 cmH2O, @ 2045,  per patients home settings.  Put sterile water in humidifier, patient tolerating well.

## 2011-10-03 NOTE — Evaluation (Signed)
Physical Therapy Evaluation Patient Details Name: Lizandro Spellman MRN: [id number] DOB: 10/21/1950 Today's Date: 10/03/2011 Time: 4098-1191 PT Time Calculation (min): 16 min  PT Assessment / Plan / Recommendation Clinical Impression  61 yo male admitted with septicemia. 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 Pt reports he has been mobilizing in room without assistance. Supervision-Mod I level on eval. Recommended pt ambulate in hallway with family/nursing supervision during stay. Pt reports he plans to have ouptatient PT after discharge to address back pain , postural issues. No further acute PT needs. 1x eval. PT will sign off.     PT Assessment  All further PT needs can be met in the next venue of care    Follow Up Recommendations  Outpatient PT    Barriers to Discharge        Equipment Recommendations  None recommended by PT    Recommendations for Other Services     Frequency      Precautions / Restrictions Precautions Precautions: None Restrictions Weight Bearing Restrictions: No   Pertinent Vitals/Pain       Mobility  Bed Mobility Bed Mobility: Supine to Sit Supine to Sit: 6: Modified independent (Device/Increase time) Transfers Transfers: Sit to Stand;Stand to Sit Sit to Stand: 6: Modified independent (Device/Increase time) Stand to Sit: 6: Modified independent (Device/Increase time) Ambulation/Gait Ambulation/Gait Assistance: 5: Supervision Ambulation Distance (Feet): 275 Feet Assistive device: None Ambulation/Gait Assistance Details: Good gait speed. 1 instance of wavering requiring Min A to help correct.  Gait Pattern: Lateral trunk lean to right Stairs: Yes Stairs Assistance: 5: Supervision Stair Management Technique: Forwards;One rail Left Number of Stairs: 2     Shoulder Instructions     Exercises     PT Diagnosis:    PT Problem List:   PT Treatment Interventions:     PT Goals    Visit Information  Last PT Received On:  10/03/11 Assistance Needed: +1    Subjective Data  Subjective: "I'm not having any trouble getting around" Patient Stated Goal: HOme. Outpatient PT   Prior Functioning  Home Living Lives With: Spouse Available Help at Discharge: Family Type of Home: House Home Access: Stairs to enter Secretary/administrator of Steps: 2 Entrance Stairs-Rails: None Home Adaptive Equipment: Straight cane Prior Function Level of Independence: Independent with assistive device(s) Able to Take Stairs?: Yes Driving: Yes Vocation: Full time employment Comments: uses cane for community ambulation Communication Communication: No difficulties    Cognition  Overall Cognitive Status: Appears within functional limits for tasks assessed/performed Arousal/Alertness: Awake/alert Orientation Level: Appears intact for tasks assessed Behavior During Session: [facility] for tasks performed    Extremity/Trunk Assessment Right Lower Extremity Assessment RLE ROM/Strength/Tone: [facility] for tasks assessed Left Lower Extremity Assessment LLE ROM/Strength/Tone: [facility] for tasks assessed Trunk Assessment Trunk Assessment: Other exceptions Trunk Exceptions: R side listing of trunk   Balance Balance Balance Assessed: Yes Static Standing Balance Static Standing - Comment/# of Minutes: Had pt perform EO/EC, narrow BOS, 360 degree turns-all without difficulty.   End of Session PT - End of Session Activity Tolerance: Patient tolerated treatment well Patient left: in chair;with call bell/phone within reach;with family/visitor present  GP     Rebeca Alert [facility] 10/03/2011, 11:16 AM [phone]

## 2011-10-03 NOTE — Progress Notes (Signed)
[facility] Progress Note  10/03/2011   Subjective: Pt says that he feels better today, he vomited for much of the night but now has been able to tolerate clears for breakfast and lunch.  Back pain is controlled. Lidoderm patch helped.    Objective:  Vital signs in last 24 hours: Filed Vitals:   10/02/11 2200 10/02/11 2300 10/03/11 0600 10/03/11 1027  BP: 187/108 148/82 140/89 133/86  Pulse: 78 72 74   Temp: 97.3 F (36.3 C)  99.1 F (37.3 C)   TempSrc: Oral  Oral   Resp: 20 20 20    SpO2: 98% 97% 97%    Weight change:  No intake or output data in the 24 hours ending 10/03/11 1351 No results found for this basename: HGBA1C   Lab Results  Component Value Date   CREATININE 1.23 10/03/2011    Review of Systems As above, otherwise all reviewed and reported negative  Physical Exam General - awake, no distress, cooperative HEENT - NCAT, MMM Lungs - BBS, CTA CV - normal s1, s2 sounds Abd - soft, nondistended, no masses, nontender Ext - no C/C/E  Lab Results: Results for orders placed during the hospital encounter of 10/02/11 (from the past 24 hour(s))  COMPREHENSIVE METABOLIC PANEL     Status: Abnormal   Collection Time   10/03/11  4:40 AM      Component Value Range   Sodium 141  135 - 145 mEq/L   Potassium 3.0 (*) 3.5 - 5.1 mEq/L   Chloride 103  96 - 112 mEq/L   CO2 23  19 - 32 mEq/L   Glucose, Bld 122 (*) 70 - 99 mg/dL   BUN 11  6 - 23 mg/dL   Creatinine, Ser 1.61  0.50 - 1.35 mg/dL   Calcium 9.4  8.4 - 09.6 mg/dL   Total Protein 7.6  6.0 - 8.3 g/dL   Albumin 3.5  3.5 - 5.2 g/dL   AST 17  0 - 37 U/L   ALT 11  0 - 53 U/L   Alkaline Phosphatase 91  39 - 117 U/L   Total Bilirubin 0.5  0.3 - 1.2 mg/dL   GFR calc non Af Amer 61 (*) >90 mL/min   GFR calc Af Amer 71 (*) >90 mL/min  CBC     Status: Abnormal   Collection Time   10/03/11  4:40 AM      Component Value Range   WBC 12.3 (*) 4.0 - 10.5 K/uL   RBC 4.57  4.22 - 5.81 MIL/uL   Hemoglobin 12.0 (*) 13.0  - 17.0 g/dL   HCT 04.5 (*) 40.9 - 81.1 %   MCV 79.9  78.0 - 100.0 fL   MCH 26.3  26.0 - 34.0 pg   MCHC 32.9  30.0 - 36.0 g/dL   RDW 91.4 (*) 78.2 - 95.6 %   Platelets 244  150 - 400 K/uL  SEDIMENTATION RATE     Status: Abnormal   Collection Time   10/03/11  4:40 AM      Component Value Range   Sed Rate 48 (*) 0 - 16 mm/hr  MAGNESIUM     Status: Normal   Collection Time   10/03/11  4:40 AM      Component Value Range   Magnesium 1.9  1.5 - 2.5 mg/dL    Micro Results: No results found for this or any previous visit (from the past 240 hour(s)).  Medications:  Scheduled Meds:   . amLODipine  10 mg Oral Daily  . aspirin EC  81 mg Oral Daily  . atorvastatin  10 mg Oral Daily  . cloNIDine  0.2 mg Oral TID  . fentaNYL  50 mcg Intravenous Once  . fluticasone  2 spray Each Nare Daily  . influenza  inactive virus vaccine  0.5 mL Intramuscular Tomorrow-1000  . lidocaine  1 patch Transdermal Q24H  . methadone  10 mg Intramuscular Once  . metoprolol succinate  50 mg Oral Daily  . ondansetron      . ondansetron (ZOFRAN) IV  4 mg Intravenous Once  . pantoprazole (PROTONIX) IV  40 mg Intravenous Q24H  . potassium chloride  10 mEq Intravenous Once  . potassium chloride SA  40 mEq Oral Once  . potassium chloride  40 mEq Oral Once  . spironolactone  25 mg Oral Daily  . DISCONTD: sodium chloride   Intravenous STAT   Continuous Infusions:   . 0.9 % NaCl with KCl 20 mEq / L 150 mL/hr at 10/03/11 1030  . DISCONTD: sodium chloride 125 mL/hr at 10/02/11 1412   PRN Meds:.hydrALAZINE, LORazepam, ondansetron (ZOFRAN) IV, ondansetron, polyethylene glycol, promethazine  Assessment/Plan: Nausea & vomiting - intractable  - improved some, continue nausea meds as needed - advance diet to soft diet  Hypokalemia  - added K to IVFs, - kdur 40 meq po  ARF (acute renal failure)  - improved with IVFs  Acute Narcotic withdrawal  - symptoms slowly improving - pt wants to wean off narcotics if  possible  Dehydration  - continue IVFs  Tachycardia  - improving  Chronic low back pain  - continue PT - outpatient PT - lidoderm patch  Hypertension - resuming home meds  Elevated WBC - recheck in AM as pt has been vomiting  Elevated ESR - recheck in AM   LOS: 1 day   Jorge Retz 10/03/2011, 1:51 PM  Cleora Fleet, MD, CDE, FAAFP [facility] [place], [place]  [phone]

## 2011-10-03 NOTE — Evaluation (Signed)
Occupational Therapy Evaluation Patient Details Name: John Brandt MRN: [id number] DOB: December 21, 1950 Today's Date: 10/03/2011 Time: 8295-6213 OT Time Calculation (min): 16 min  OT Assessment / Plan / Recommendation Clinical Impression  Pt was admitted 10/02/11, w/ dx  ARF, N & V and LBP. He presents today overall Mod I functional mobility and transfers and he reports that he has been ambulating in room (to/from bathroom) w/o difficulty since admit to hospital. He was observed to be Mod I toileting/transfers and LB dressing etc today during assessment and does not present with any further OT needs. Will sign off OT at this time    OT Assessment  Patient does not need any further OT services    Follow Up Recommendations  No OT follow up          Equipment Recommendations  None recommended by OT             Precautions / Restrictions Precautions Precautions: None Restrictions Weight Bearing Restrictions: No   Pertinent Vitals/Pain Pt reports "no pain" 0/10 "I have a patch on"    ADL  Eating/Feeding: Performed;Independent Where Assessed - Eating/Feeding: Bed level Grooming: Performed;Wash/dry hands;Modified independent Where Assessed - Grooming: Unsupported standing (at sink in bathroom) Upper Body Bathing: Simulated;Modified independent Where Assessed - Upper Body Bathing: Unsupported sitting Lower Body Bathing: Simulated;Modified independent Where Assessed - Lower Body Bathing: Unsupported sit to stand Upper Body Dressing: Simulated;Modified independent Where Assessed - Upper Body Dressing: Unsupported sitting Lower Body Dressing: Performed;Modified independent Where Assessed - Lower Body Dressing: Unsupported sitting Toilet Transfer: Performed;Modified independent Toilet Transfer Method: Sit to Barista: Comfort height toilet Toileting - Clothing Manipulation and Hygiene: Performed;Modified independent Where Assessed - Toileting Clothing  Manipulation and Hygiene: Sit to stand from 3-in-1 or toilet Tub/Shower Transfer Method: Not assessed ADL Comments: Pt is overall Mod I functional mobility and transfers and he reports that he has been ambulating in room (to/from bathroom) w/o difficulty since admit to hospital. He was observed to be Mod I toileting/transfers and LB dressing etc today during assessment and does not present with any further OT needs. Will sign off OT at this time.     :           Visit Information  Last OT Received On: 10/03/11 Assistance Needed: +1 PT/OT Co-Evaluation/Treatment: Yes    Subjective Data  Subjective: I don't have any trouble getting around or in the bathroom. Patient Stated Goal: Return home    Prior Functioning     Home Living Lives With: Spouse Available Help at Discharge: Family Type of Home: House Home Access: Stairs to enter [employer] of Steps: 2 Entrance Stairs-Rails: None Bathroom Shower/Tub: Walk-in shower;Other (comment) (Grab bars) Bathroom Toilet:  (Comfort height toilets in house) Bathroom Accessibility: Yes Home Adaptive Equipment: Straight cane;Grab bars in shower;Other (comment) (Comfort height toilets) Prior Function Level of Independence: Independent with assistive device(s) Able to Take Stairs?: Yes Driving: Yes Vocation: Full time employment Comments: uses cane for community ambulation Communication Communication: No difficulties Dominant Hand: Right    Vision/Perception  Wears glasses. No change from baseline   Cognition  Overall Cognitive Status: Appears within functional limits for tasks assessed/performed Arousal/Alertness: Awake/alert Orientation Level: Appears intact for tasks assessed Behavior During Session: [facility] for tasks performed    Extremity/Trunk Assessment Right Upper Extremity Assessment RUE ROM/Strength/Tone: Within functional levels Left Upper Extremity Assessment LUE ROM/Strength/Tone: Within functional levels Right  Lower Extremity Assessment RLE ROM/Strength/Tone: [facility] for tasks assessed Left Lower Extremity Assessment  LLE ROM/Strength/Tone: [facility] for tasks assessed Trunk Assessment Trunk Assessment: Other exceptions Trunk Exceptions: R side listing of trunk     Mobility Bed Mobility Bed Mobility: Supine to Sit Supine to Sit: 6: Modified independent (Device/Increase time) Transfers Transfers: Sit to Stand Sit to Stand: 6: Modified independent (Device/Increase time) Stand to Sit: 6: Modified independent (Device/Increase time)                End of Session OT - End of Session Activity Tolerance: Patient tolerated treatment well Patient left: in chair;with call bell/phone within reach;with family/visitor present  GO     Roselie Awkward Dixon 10/03/2011, 12:23 PM

## 2011-10-04 ENCOUNTER — Inpatient Hospital Stay (HOSPITAL_COMMUNITY): Payer: BC Managed Care – PPO

## 2011-10-04 DIAGNOSIS — R001 Bradycardia, unspecified: Secondary | ICD-10-CM | POA: Diagnosis not present

## 2011-10-04 LAB — CBC WITH DIFFERENTIAL/PLATELET
Basophils Absolute: 0 10*3/uL (ref 0.0–0.1)
Eosinophils Relative: 5 % (ref 0–5)
Lymphocytes Relative: 16 % (ref 12–46)
Neutro Abs: 5.8 10*3/uL (ref 1.7–7.7)
Neutrophils Relative %: 69 % (ref 43–77)
Platelets: 191 10*3/uL (ref 150–400)
RBC: 4.14 MIL/uL — ABNORMAL LOW (ref 4.22–5.81)
RDW: 16.4 % — ABNORMAL HIGH (ref 11.5–15.5)
WBC: 8.5 10*3/uL (ref 4.0–10.5)

## 2011-10-04 LAB — COMPREHENSIVE METABOLIC PANEL
ALT: 10 U/L (ref 0–53)
AST: 13 U/L (ref 0–37)
Alkaline Phosphatase: 75 U/L (ref 39–117)
CO2: 22 mEq/L (ref 19–32)
Calcium: 8.2 mg/dL — ABNORMAL LOW (ref 8.4–10.5)
Chloride: 106 mEq/L (ref 96–112)
GFR calc non Af Amer: 63 mL/min — ABNORMAL LOW (ref >90)
Potassium: 3.3 mEq/L — ABNORMAL LOW (ref 3.5–5.1)
Sodium: 138 mEq/L (ref 135–145)

## 2011-10-04 MED ORDER — METOPROLOL SUCCINATE ER 25 MG PO TB24
25.0000 mg | ORAL_TABLET | Freq: Every day | ORAL | Status: DC
  Filled 2011-10-04: qty 1

## 2011-10-04 MED ORDER — ONDANSETRON HCL 4 MG/2ML IJ SOLN
4.0000 mg | Freq: Four times a day (QID) | INTRAMUSCULAR | Status: DC
  Administered 2011-10-04 – 2011-10-08 (×10): 4 mg via INTRAVENOUS
  Filled 2011-10-04 (×11): qty 2

## 2011-10-04 MED ORDER — METOPROLOL SUCCINATE ER 25 MG PO TB24: 25.0000 mg | ORAL_TABLET | Freq: Every day | ORAL | Status: DC

## 2011-10-04 MED ORDER — LORAZEPAM 2 MG/ML IJ SOLN
1.0000 mg | Freq: Once | INTRAMUSCULAR | Status: DC
  Filled 2011-10-04: qty 1

## 2011-10-04 MED ORDER — POTASSIUM CHLORIDE CRYS ER 20 MEQ PO TBCR
40.0000 meq | EXTENDED_RELEASE_TABLET | Freq: Once | ORAL | Status: AC
  Administered 2011-10-04: 40 meq via ORAL
  Filled 2011-10-04: qty 2

## 2011-10-04 MED ORDER — ENSURE COMPLETE PO LIQD: 237.0000 mL | ORAL | Status: DC | PRN

## 2011-10-04 MED ORDER — NAPROXEN 500 MG PO TABS
500.0000 mg | ORAL_TABLET | Freq: Two times a day (BID) | ORAL | Status: DC | PRN
  Filled 2011-10-04: qty 1

## 2011-10-04 MED ORDER — PANTOPRAZOLE SODIUM 40 MG PO TBEC
40.0000 mg | DELAYED_RELEASE_TABLET | Freq: Every day | ORAL | Status: DC
  Administered 2011-10-04 – 2011-10-08 (×5): 40 mg via ORAL
  Filled 2011-10-04 (×4): qty 1

## 2011-10-04 MED ORDER — METOPROLOL SUCCINATE ER 25 MG PO TB24
25.0000 mg | ORAL_TABLET | Freq: Every day | ORAL | Status: DC
  Administered 2011-10-05 – 2011-10-08 (×4): 25 mg via ORAL
  Filled 2011-10-04 (×4): qty 1

## 2011-10-04 NOTE — Progress Notes (Signed)
Pt still having significant nausea and wretching.  Will order zofran IV around the clock, ativan IV, ordered xray abdomen, continue IV protonix, phenergan IV every 6 hours.  Sips of clears if tolerated.  Maryln Manuel, MD.

## 2011-10-04 NOTE — Progress Notes (Addendum)
INITIAL ADULT NUTRITION ASSESSMENT Date: 10/04/2011   Time: 1:32 PM Reason for Assessment: Nutrition risk   INTERVENTION: Anti-emetics per MD. Ensure Complete PRN as nausea resolves. Diet advancement per MD. Will monitor.   Pt meets criteria for severe malnutrition of chronic illness AEB <75% estimated energy intake for at least the past month with 13.2% weight loss in the past 5 months per pt report.   ASSESSMENT: Male 61 y.o.  Dx: Intractable nausea/vomiting   Food/Nutrition Related Hx: Pt reports 40 pound unintentional weight loss since May of this year r/t back pain and ongoing mild nausea/vomiting which has worsened this past week with pt being unable to eat since Thursday night PTA. Pt reports before then he was eating 2-3 meals/day with daily Ensure. Pt reports still having nausea but no vomiting except for last night. Pt reports Phenergan is working best for him. Pt reports tolerating full liquid diet.   Hx: Past Medical History  Diagnosis Date  . Hypertension   . Hyperlipidemia   . Allergic rhinitis   . Hx of cardiac cath   . Complication of anesthesia   . PONV (postoperative nausea and vomiting)   . Sleep apnea     sleep study 07-02-02  . Chronic back pain   . 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 amLODipine  10 mg Oral Daily  . aspirin EC  81 mg Oral Daily  . atorvastatin  10 mg Oral Daily  . cloNIDine  0.2 mg Oral TID  . fluticasone  2 spray Each Nare Daily  . influenza  inactive virus vaccine  0.5 mL Intramuscular Tomorrow-1000  . lidocaine  1 patch Transdermal Q24H  . metoprolol succinate  25 mg Oral Daily  . pantoprazole  40 mg Oral Daily  . potassium chloride  40 mEq Oral Once  . potassium chloride  40 mEq Oral Once  . spironolactone  25 mg Oral Daily  . DISCONTD: metoprolol succinate  25 mg Oral Daily  . DISCONTD: metoprolol succinate  25 mg Oral  Daily  . DISCONTD: metoprolol succinate  50 mg Oral Daily  . DISCONTD: pantoprazole (PROTONIX) IV  40 mg Intravenous Q24H   Continuous Infusions:   . 0.9 % NaCl with KCl 20 mEq / L 150 mL/hr (10/04/11 0936)   PRN Meds:.acetaminophen, hydrALAZINE, LORazepam, naproxen, ondansetron (ZOFRAN) IV, ondansetron, polyethylene glycol, promethazine  Ht: 6\' 1"  (185.4 cm)  Wt: 263 lb 14.4 oz (119.704 kg)  Ideal Wt: 184 lb % Ideal Wt: 142  Usual Wt: 303 lb per pt report % Usual Wt: 86  Wt Readings from Last 10 Encounters:  10/03/11 263 lb 14.4 oz (119.704 kg)  09/19/11 277 lb (125.646 kg)  09/14/11 273 lb (123.832 kg)  08/25/11 294 lb (133.358 kg)  08/08/11 292 lb 5.3 oz (132.6 kg)  08/08/11 292 lb 5.3 oz (132.6 kg)  08/02/11 292 lb 6.4 oz (132.632 kg)  07/22/11 283 lb (128.368 kg)  06/30/11 281 lb 9.6 oz (127.733 kg)  03/08/11 312 lb 6.4 oz (141.704 kg)     Body mass index is 34.82 kg/(m^2). Class I obesity   Labs:  CMP     Component Value Date/Time   NA 138 10/04/2011 0440   K 3.3* 10/04/2011 0440   CL 106 10/04/2011 0440   CO2 22 10/04/2011 0440   GLUCOSE 90 10/04/2011  0440   BUN 13 10/04/2011 0440   CREATININE 1.20 10/04/2011 0440   CREATININE 0.80 09/14/2011 1544   CALCIUM 8.2* 10/04/2011 0440   PROT 6.1 10/04/2011 0440   ALBUMIN 2.8* 10/04/2011 0440   AST 13 10/04/2011 0440   ALT 10 10/04/2011 0440   ALKPHOS 75 10/04/2011 0440   BILITOT 0.4 10/04/2011 0440   GFRNONAA 63* 10/04/2011 0440   GFRAA 73* 10/04/2011 0440    Intake/Output Summary (Last 24 hours) at 10/04/11 1339 Last data filed at 10/03/11 1400  Gross per 24 hour  Intake   2245 ml  Output      0 ml  Net   2245 ml   Last BM - 9/30  Diet Order: Full Liquid    IVF:    0.9 % NaCl with KCl 20 mEq / L Last Rate: 150 mL/hr (10/04/11 0936)    Estimated Nutritional Needs:   Kcal:2100-2500 Protein:85-100g Fluid:2.1-2.5L  NUTRITION DIAGNOSIS: -Inadequate oral intake (NI-2.1).  Status: Ongoing  RELATED TO:  nausea/vomiting  AS EVIDENCE BY: pt statement, unintended weight loss  MONITORING/EVALUATION(Goals):  1. Resolution of nausea/vomiting 2. Advance diet as tolerated to bland diet  EDUCATION NEEDS: -No education needs identified at this time  Dietitian #: [phone]  DOCUMENTATION CODES Per approved criteria  -Severe malnutrition in the context of chronic illness -Obesity Unspecified    Marshall Cork 10/04/2011, 1:32 PM

## 2011-10-04 NOTE — Progress Notes (Addendum)
[facility] Progress Note  10/04/2011  Subjective: Pt says that he is feeling nauseated this morning and he was not able to start the soft diet yesterday.   He became bradycardic overnight.  He says that he has recently lost 40 pounds.  He says that his back hurts but the lidoderm patch helps with the pain.    Objective:  Vital signs in last 24 hours: Filed Vitals:   10/03/11 2045 10/03/11 2200 10/04/11 0037 10/04/11 0600  BP:  131/80 148/87 143/82  Pulse: 50 46 56 50  Temp:  98.1 F (36.7 C)  98.2 F (36.8 C)  TempSrc:  Oral  Oral  Resp: 16 18 18 14   Height:      Weight:      SpO2: 98% 96%  97%   Weight change:   Intake/Output Summary (Last 24 hours) at 10/04/11 0825 Last data filed at 10/03/11 1400  Gross per 24 hour  Intake   2245 ml  Output      0 ml  Net   2245 ml   No results found for this basename: HGBA1C   Lab Results  Component Value Date   CREATININE 1.20 10/04/2011    Review of Systems As above, otherwise all reviewed and reported negative  Physical Exam General - awake, no distress, cooperative  HEENT - NCAT, MMM Lungs - BBS, CTA  CV - normal s1, s2 sounds  Abd - soft, nondistended, no masses, nontender  Ext - no C/C/E  Lab Results: Results for orders placed during the hospital encounter of 10/02/11 (from the past 24 hour(s))  CBC WITH DIFFERENTIAL     Status: Abnormal   Collection Time   10/04/11  4:40 AM      Component Value Range   WBC 8.5  4.0 - 10.5 K/uL   RBC 4.14 (*) 4.22 - 5.81 MIL/uL   Hemoglobin 10.9 (*) 13.0 - 17.0 g/dL   HCT 40.9 (*) 81.1 - 91.4 %   MCV 82.6  78.0 - 100.0 fL   MCH 26.3  26.0 - 34.0 pg   MCHC 31.9  30.0 - 36.0 g/dL   RDW 78.2 (*) 95.6 - 21.3 %   Platelets 191  150 - 400 K/uL   Neutrophils Relative 69  43 - 77 %   Neutro Abs 5.8  1.7 - 7.7 K/uL   Lymphocytes Relative 16  12 - 46 %   Lymphs Abs 1.3  0.7 - 4.0 K/uL   Monocytes Relative 10  3 - 12 %   Monocytes Absolute 0.8  0.1 - 1.0 K/uL   Eosinophils  Relative 5  0 - 5 %   Eosinophils Absolute 0.5  0.0 - 0.7 K/uL   Basophils Relative 0  0 - 1 %   Basophils Absolute 0.0  0.0 - 0.1 K/uL  COMPREHENSIVE METABOLIC PANEL     Status: Abnormal   Collection Time   10/04/11  4:40 AM      Component Value Range   Sodium 138  135 - 145 mEq/L   Potassium 3.3 (*) 3.5 - 5.1 mEq/L   Chloride 106  96 - 112 mEq/L   CO2 22  19 - 32 mEq/L   Glucose, Bld 90  70 - 99 mg/dL   BUN 13  6 - 23 mg/dL   Creatinine, Ser 0.86  0.50 - 1.35 mg/dL   Calcium 8.2 (*) 8.4 - 10.5 mg/dL   Total Protein 6.1  6.0 - 8.3 g/dL   Albumin 2.8 (*)  3.5 - 5.2 g/dL   AST 13  0 - 37 U/L   ALT 10  0 - 53 U/L   Alkaline Phosphatase 75  39 - 117 U/L   Total Bilirubin 0.4  0.3 - 1.2 mg/dL   GFR calc non Af Amer 63 (*) >90 mL/min   GFR calc Af Amer 73 (*) >90 mL/min  SEDIMENTATION RATE     Status: Abnormal   Collection Time   10/04/11  4:40 AM      Component Value Range   Sed Rate 26 (*) 0 - 16 mm/hr    Micro Results: No results found for this or any previous visit (from the past 240 hour(s)).  Medications:  Scheduled Meds:   . amLODipine  10 mg Oral Daily  . aspirin EC  81 mg Oral Daily  . atorvastatin  10 mg Oral Daily  . cloNIDine  0.2 mg Oral TID  . fluticasone  2 spray Each Nare Daily  . influenza  inactive virus vaccine  0.5 mL Intramuscular Tomorrow-1000  . lidocaine  1 patch Transdermal Q24H  . metoprolol succinate  25 mg Oral Daily  . pantoprazole (PROTONIX) IV  40 mg Intravenous Q24H  . potassium chloride  40 mEq Oral Once  . spironolactone  25 mg Oral Daily  . DISCONTD: metoprolol succinate  25 mg Oral Daily  . DISCONTD: metoprolol succinate  50 mg Oral Daily   Continuous Infusions:   . 0.9 % NaCl with KCl 20 mEq / L 150 mL/hr at 10/03/11 1738   PRN Meds:.acetaminophen, hydrALAZINE, LORazepam, ondansetron (ZOFRAN) IV, ondansetron, polyethylene glycol, promethazine  Assessment/Plan:  Nausea & vomiting - intractable  - improved some, continue nausea  meds as needed  - continue full liquids, advance diet as tolerated  Hypokalemia  - added K to IVFs,  - additional kdur 40 meq po today  ARF (acute renal failure)  - improved with IVFs   Acute Narcotic withdrawal  - symptoms slowly improving  - pt wants to wean off all narcotics if possible - pt received methadone 10mg  IV x 1 dose on admission - off fentanyl patch completely at this time - cont symptom management in hospital  Dehydration  - continue IVFs   Tachycardia  - improved  Bradycardia - holding metoprolol today - reduce dose by 50% to resume tomorrow  OSA - continue nightly CPAP  Chronic low back pain  - continue PT  - outpatient PT after discharge - lidoderm patch   Hypertension  - resuming home meds, some doses adjusted as needed  Elevated WBC  - back to normal now  Elevated ESR  - rechecked ESR back to baseline    LOS: 2 days   Clanford Johnson 10/04/2011, 8:25 AM  Cleora Fleet, MD, CDE, FAAFP [facility] [place], [place]  [phone]

## 2011-10-05 ENCOUNTER — Inpatient Hospital Stay (HOSPITAL_COMMUNITY): Payer: BC Managed Care – PPO

## 2011-10-05 LAB — COMPREHENSIVE METABOLIC PANEL
Alkaline Phosphatase: 95 U/L (ref 39–117)
BUN: 7 mg/dL (ref 6–23)
GFR calc Af Amer: 89 mL/min — ABNORMAL LOW (ref >90)
Glucose, Bld: 107 mg/dL — ABNORMAL HIGH (ref 70–99)
Potassium: 3 mEq/L — ABNORMAL LOW (ref 3.5–5.1)
Total Bilirubin: 0.5 mg/dL (ref 0.3–1.2)
Total Protein: 7.7 g/dL (ref 6.0–8.3)

## 2011-10-05 MED ORDER — TECHNETIUM TC 99M MEBROFENIN IV KIT
5.3000 | PACK | Freq: Once | INTRAVENOUS | Status: AC | PRN
  Administered 2011-10-05: 5 via INTRAVENOUS

## 2011-10-05 MED ORDER — POTASSIUM CHLORIDE 10 MEQ/100ML IV SOLN
10.0000 meq | INTRAVENOUS | Status: AC
  Administered 2011-10-05 (×4): 10 meq via INTRAVENOUS
  Filled 2011-10-05 (×4): qty 100

## 2011-10-05 NOTE — Progress Notes (Signed)
Patient unable to take PO medications at this time due to nausea and some periods of vomitus,patient is unwilling to attempt PO medications fearful that he will vomit them back up, Dr notified of patient's condition

## 2011-10-05 NOTE — Progress Notes (Signed)
Paged Dr. Ashley Royalty to notify of the episode of trigeminy. Awaiting return page.

## 2011-10-05 NOTE — Progress Notes (Signed)
Subjective: Patient continues to have nausea and vomiting. He states that his last episode of vomiting was just prior to breakfast this morning. The patient is scheduled for a HIDA scan this afternoon Objective: Filed Vitals:   10/04/11 2156 10/04/11 2235 10/05/11 0713 10/05/11 1500  BP: 157/103 143/83 162/110 148/95  Pulse: 72  87 81  Temp: 99.1 F (37.3 C)  97.7 F (36.5 C) 99.4 F (37.4 C)  TempSrc: Oral  Oral Oral  Resp: 18  18 18   Height:      Weight:      SpO2: 96%  98% 97%   Weight change:   Intake/Output Summary (Last 24 hours) at 10/05/11 1841 Last data filed at 10/05/11 0700  Gross per 24 hour  Intake   1875 ml  Output      0 ml  Net   1875 ml    General: Alert, awake, oriented x3.Marland Kitchen  HEENT: [place]/AT PEER[place], EOMI Neck: Trachea midline,  no masses, no thyromegal,y no JVD, no carotid bruit OROPHARYNX:  Moist, No exudate/ erythema/lesions.  Heart: Regular rate and rhythm with occasional PVCs on telemetry Lungs: Clear to auscultation,.  Abdomen: Soft, nontender, nondistended, positive bowel sounds.  Musculoskeletal: No warm swelling or erythema around joints, no spinal tenderness noted.    Lab Results:  Basename 10/05/11 0500 10/04/11 0440 10/03/11 0440  NA 137 138 --  K 3.0* 3.3* --  CL 101 106 --  CO2 21 22 --  GLUCOSE 107* 90 --  BUN 7 13 --  CREATININE 1.02 1.20 --  CALCIUM 9.2 8.2* --  MG -- -- 1.9  PHOS -- -- --    Basename 10/05/11 0500 10/04/11 0440  AST 14 13  ALT 10 10  ALKPHOS 95 75  BILITOT 0.5 0.4  PROT 7.7 6.1  ALBUMIN 3.5 2.8*   No results found for this basename: LIPASE:2,AMYLASE:2 in the last 72 hours  Basename 10/04/11 0440 10/03/11 0440  WBC 8.5 12.3*  NEUTROABS 5.8 --  HGB 10.9* 12.0*  HCT 34.2* 36.5*  MCV 82.6 79.9  PLT 191 244   No results found for this basename: CKTOTAL:3,CKMB:3,CKMBINDEX:3,TROPONINI:3 in the last 72 hours No components found with this basename: POCBNP:3 No results found for this basename: DDIMER:2 in  the last 72 hours No results found for this basename: HGBA1C:2 in the last 72 hours No results found for this basename: CHOL:2,HDL:2,LDLCALC:2,TRIG:2,CHOLHDL:2,LDLDIRECT:2 in the last 72 hours No results found for this basename: TSH,T4TOTAL,FREET3,T3FREE,THYROIDAB in the last 72 hours No results found for this basename: VITAMINB12:2,FOLATE:2,FERRITIN:2,TIBC:2,IRON:2,RETICCTPCT:2 in the last 72 hours  Micro Results: No results found for this or any previous visit (from the past 240 hour(s)).  Studies/Results: Nm Hepatobiliary  10/05/2011  *RADIOLOGY REPORT*  Clinical Data: Intractable nausea and vomiting, known cholelithiasis  NUCLEAR MEDICINE HEPATOHBILIARY INCLUDE GB  Radiopharmaceutical:  5.3 mCi technetium 1m Choletec  Comparison: CT abdomen pelvis of 06/30/2011  Findings: The patient was injected with 5.3 mCi of technetium 31m Choletec intravenously and imaging over the upper abdomen was performed.  Over the first 50 minutes, the radionuclide is excreted by the kidney and there is visualization of the common bile duct and small bowel.  However, the gallbladder fills on a delayed basis at approximately 1 hour after beginning the study.  This is nonspecific but can be seen with chronic cholecystitis.  However this visualization of the gallbladder does indicate cystic duct patency.  IMPRESSION: Delayed visualization of the gallbladder may indicate chronic cholecystitis, but the cystic duct is patent.  Original Report Authenticated By: Juline Patch, M.D.    Dg Chest Port 1 View  10/04/2011  *RADIOLOGY REPORT*  Clinical Data: Intractable nausea, vomiting.  PORTABLE CHEST - 1 VIEW  Comparison: 08/08/2011  Findings: Cardiomegaly.  Calcified right granuloma and right hilar/mediastinal lymph nodes.  Scarring in the lung bases bilaterally.  No acute infiltrates or effusions.  IMPRESSION: Cardiomegaly/chronic changes.  No active disease.   Original Report Authenticated By: Cyndie Chime, M.D.    Dg Abd  Portable 1v  10/04/2011  *RADIOLOGY REPORT*  Clinical Data: Nausea and vomiting  PORTABLE ABDOMEN - 1 VIEW  Comparison: None.  Findings: The bowel gas pattern is within normal limits.  No obstruction or free air is seen on this supine examination.  There are laminated gallstones in the right upper quadrant.  There is lumbar levoscoliosis. There is a calcified granuloma in the right lower lobe region.  There are also right hilar and subcarinal calcified lymph nodes, consistent with prior granulomatous disease.  IMPRESSION: Nonspecific gas pattern.  Laminated gallstones in the right upper quadrant.   Original Report Authenticated By: Arvin Collard. WOODRUFF III, M.D.     Medications: I have reviewed the patient's current medications. Scheduled Meds:   . amLODipine  10 mg Oral Daily  . aspirin EC  81 mg Oral Daily  . atorvastatin  10 mg Oral Daily  . cloNIDine  0.2 mg Oral TID  . fluticasone  2 spray Each Nare Daily  . lidocaine  1 patch Transdermal Q24H  . LORazepam  1 mg Intravenous Once  . metoprolol succinate  25 mg Oral Daily  . ondansetron (ZOFRAN) IV  4 mg Intravenous Q6H  . pantoprazole  40 mg Oral Daily  . spironolactone  25 mg Oral Daily   Continuous Infusions:   . 0.9 % NaCl with KCl 20 mEq / L 150 mL/hr at 10/05/11 0254   PRN Meds:.acetaminophen, feeding supplement, hydrALAZINE, LORazepam, naproxen, polyethylene glycol, promethazine, technetium TC 20M mebrofenin Assessment/Plan: Patient Active Hospital Problem List: Nausea & vomiting:   -patient continues to have nausea and vomiting. Etiology is unclear. Patient scheduled for a HIDA scan this afternoon we'll review post testing and make further decisions.   Hypokalemia:   -Will replace potassium by IV.  ARF (acute renal failure): -Resolved   Narcotic withdrawal: -The patient does not want to take any narcotics and this likely is causing some narcotic withdrawal contributing to the nausea and vomiting.      Dehydration: -Continue IV fluid     Tachycardia: -Associated with pain  -Manage pain     Chronic low back pain: -Patient refusing any further pain medications. -Continue lidocaine       LOS: 3 days

## 2011-10-06 DIAGNOSIS — R790 Abnormal level of blood mineral: Secondary | ICD-10-CM | POA: Diagnosis not present

## 2011-10-06 LAB — MAGNESIUM: Magnesium: 1.6 mg/dL (ref 1.5–2.5)

## 2011-10-06 LAB — BASIC METABOLIC PANEL
CO2: 23 mEq/L (ref 19–32)
Calcium: 8.4 mg/dL (ref 8.4–10.5)
GFR calc non Af Amer: 60 mL/min — ABNORMAL LOW (ref >90)
Sodium: 137 mEq/L (ref 135–145)

## 2011-10-06 MED ORDER — MAGNESIUM SULFATE 40 MG/ML IJ SOLN
2.0000 g | Freq: Once | INTRAMUSCULAR | Status: AC
  Administered 2011-10-06: 2 g via INTRAVENOUS
  Filled 2011-10-06: qty 50

## 2011-10-06 MED ORDER — POTASSIUM CHLORIDE CRYS ER 20 MEQ PO TBCR
40.0000 meq | EXTENDED_RELEASE_TABLET | ORAL | Status: AC
  Administered 2011-10-06 (×2): 40 meq via ORAL
  Filled 2011-10-06 (×2): qty 2

## 2011-10-06 NOTE — Progress Notes (Signed)
Subjective: Patient is markedly improved today. He has no further "shakes", and he is to tolerate folate with without any nausea vomiting. The patient expresses concern about his continued pain without narcotic use. I had a long discussion with options for treatment and we have decided on naproxen and if necessary tramadol.   Objective: Filed Vitals:   10/05/11 2142 10/05/11 2253 10/06/11 0600 10/06/11 1426  BP: 168/115 144/91 160/90 119/76  Pulse: 62 54 58 62  Temp: 98.1 F (36.7 C)  98.2 F (36.8 C) 98.2 F (36.8 C)  TempSrc: Oral  Oral Oral  Resp: 20  18 20   Height:      Weight:      SpO2: 99%  100% 97%   Weight change:   Intake/Output Summary (Last 24 hours) at 10/06/11 1828 Last data filed at 10/06/11 1500  Gross per 24 hour  Intake   3657 ml  Output      0 ml  Net   3657 ml    General: Alert, awake, oriented x3. Very energetic-appearing today.  HEENT: /AT [place]ER[place], EOMI Neck: Trachea midline,  no masses, no thyromegal,y no JVD, no carotid bruit OROPHARYNX:  Moist, No exudate/ erythema/lesions.  Heart: Regular rate and rhythm with occasional PVCs on telemetry Lungs: Clear to auscultation,.  Abdomen: Soft, nontender, nondistended, positive bowel sounds.  Musculoskeletal: No warm swelling or erythema around joints, no spinal tenderness noted.    Lab Results:  Basename 10/06/11 0500 10/05/11 0500  NA 137 137  K 3.1* 3.0*  CL 103 101  CO2 23 21  GLUCOSE 101* 107*  BUN 10 7  CREATININE 1.25 1.02  CALCIUM 8.4 9.2  MG 1.6 --  PHOS -- --    Basename 10/05/11 0500 10/04/11 0440  AST 14 13  ALT 10 10  ALKPHOS 95 75  BILITOT 0.5 0.4  PROT 7.7 6.1  ALBUMIN 3.5 2.8*   No results found for this basename: LIPASE:2,AMYLASE:2 in the last 72 hours  Basename 10/04/11 0440  WBC 8.5  NEUTROABS 5.8  HGB 10.9*  HCT 34.2*  MCV 82.6  PLT 191   No results found for this basename: CKTOTAL:3,CKMB:3,CKMBINDEX:3,TROPONINI:3 in the last 72 hours No components found with  this basename: POCBNP:3 No results found for this basename: DDIMER:2 in the last 72 hours No results found for this basename: HGBA1C:2 in the last 72 hours No results found for this basename: CHOL:2,HDL:2,LDLCALC:2,TRIG:2,CHOLHDL:2,LDLDIRECT:2 in the last 72 hours No results found for this basename: TSH,T4TOTAL,FREET3,T3FREE,THYROIDAB in the last 72 hours No results found for this basename: VITAMINB12:2,FOLATE:2,FERRITIN:2,TIBC:2,IRON:2,RETICCTPCT:2 in the last 72 hours  Micro Results: No results found for this or any previous visit (from the past 240 hour(s)).  Studies/Results: Nm Hepatobiliary  10/05/2011  *RADIOLOGY REPORT*  Clinical Data: Intractable nausea and vomiting, known cholelithiasis  NUCLEAR MEDICINE HEPATOHBILIARY INCLUDE GB  Radiopharmaceutical:  5.3 mCi technetium 87m Choletec  Comparison: CT abdomen pelvis of 06/30/2011  Findings: The patient was injected with 5.3 mCi of technetium 21m Choletec intravenously and imaging over the upper abdomen was performed.  Over the first 50 minutes, the radionuclide is excreted by the kidney and there is visualization of the common bile duct and small bowel.  However, the gallbladder fills on a delayed basis at approximately 1 hour after beginning the study.  This is nonspecific but can be seen with chronic cholecystitis.  However this visualization of the gallbladder does indicate cystic duct patency.  IMPRESSION: Delayed visualization of the gallbladder may indicate chronic cholecystitis, but the cystic duct  is patent.   Original Report Authenticated By: Juline Patch, M.D.    Dg Chest Port 1 View  10/04/2011  *RADIOLOGY REPORT*  Clinical Data: Intractable nausea, vomiting.  PORTABLE CHEST - 1 VIEW  Comparison: 08/08/2011  Findings: Cardiomegaly.  Calcified right granuloma and right hilar/mediastinal lymph nodes.  Scarring in the lung bases bilaterally.  No acute infiltrates or effusions.  IMPRESSION: Cardiomegaly/chronic changes.  No active  disease.   Original Report Authenticated By: Cyndie Chime, M.D.    Dg Abd Portable 1v  10/04/2011  *RADIOLOGY REPORT*  Clinical Data: Nausea and vomiting  PORTABLE ABDOMEN - 1 VIEW  Comparison: None.  Findings: The bowel gas pattern is within normal limits.  No obstruction or free air is seen on this supine examination.  There are laminated gallstones in the right upper quadrant.  There is lumbar levoscoliosis. There is a calcified granuloma in the right lower lobe region.  There are also right hilar and subcarinal calcified lymph nodes, consistent with prior granulomatous disease.  IMPRESSION: Nonspecific gas pattern.  Laminated gallstones in the right upper quadrant.   Original Report Authenticated By: Arvin Collard. WOODRUFF III, M.D.     Medications: I have reviewed the patient's current medications. Scheduled Meds:    . amLODipine  10 mg Oral Daily  . aspirin EC  81 mg Oral Daily  . atorvastatin  10 mg Oral Daily  . cloNIDine  0.2 mg Oral TID  . fluticasone  2 spray Each Nare Daily  . lidocaine  1 patch Transdermal Q24H  . LORazepam  1 mg Intravenous Once  . metoprolol succinate  25 mg Oral Daily  . ondansetron (ZOFRAN) IV  4 mg Intravenous Q6H  . pantoprazole  40 mg Oral Daily  . potassium chloride  10 mEq Intravenous Q1 Hr x 4  . spironolactone  25 mg Oral Daily   Continuous Infusions:    . 0.9 % NaCl with KCl 20 mEq / L 150 mL/hr at 10/06/11 0700   PRN Meds:.acetaminophen, feeding supplement, hydrALAZINE, LORazepam, naproxen, polyethylene glycol, promethazine Assessment/Plan: Patient Active Hospital Problem List: Nausea & vomiting:   -the results of the HIDA scan are noted. However based on the clinical examination I do not believe that the patient's problem is coming from chronic cholecystitis. I do believe that the nausea and vomiting was mostly related to withdrawal from narcotics. The patient is much better today he's tolerating his full liquid diet. I will advance his diet  to a heart healthy diet and continue to observe him. He has any resumption of some nausea and vomited I will consult gastroenterology/surgery to assist in the evaluation of the patient.   Hypokalemia:   -Will give oral potassium.  Hypomagnesium -Replete by IV  ARF (acute renal failure): -Resolved   Narcotic withdrawal: -The patient does not want to take any narcotics and this likely is causing some narcotic withdrawal contributing to the nausea and vomiting.     Dehydration: -Continue IV fluid until tolerating full diet then discontinue     Tachycardia: -Associated with pain  -Manage pain     Chronic low back pain: -Patient refusing any further pain medications. -Continue lidocaine       LOS: 4 days

## 2011-10-07 LAB — BASIC METABOLIC PANEL
BUN: 8 mg/dL (ref 6–23)
CO2: 21 mEq/L (ref 19–32)
Calcium: 8.4 mg/dL (ref 8.4–10.5)
Chloride: 108 mEq/L (ref 96–112)
Creatinine, Ser: 1.1 mg/dL (ref 0.50–1.35)

## 2011-10-07 MED ORDER — TRAMADOL HCL 50 MG PO TABS
50.0000 mg | ORAL_TABLET | Freq: Four times a day (QID) | ORAL | Status: DC | PRN
  Administered 2011-10-07: 50 mg via ORAL
  Filled 2011-10-07: qty 1

## 2011-10-07 NOTE — Progress Notes (Signed)
Called to patient's bedside.  Pt requested additional sterile water added to humidity chamber, which I did.  Cpap settings are 12cm h2o per home regimen.  Pt stated he will place himself on nasal cpap later when ready for bed.  Pt encouraged to let his nurse/RT know should he need further assistance.

## 2011-10-07 NOTE — Progress Notes (Signed)
Subjective: Patient is markedly improved today. He was able to tolerate full diet without any vomiting but had some mild nausea. The patient also states that he had pain in his back last night however use Tylenol but felt that it was inadequate to control the pain. He aches present the concern about his ability to now that he's off the pain medications as although he did well with physical therapy previously his evaluation that was while he was on high doses of narcotics.   Objective: Filed Vitals:   10/06/11 2234 10/06/11 2248 10/07/11 0650 10/07/11 1348  BP: 141/84  139/82 140/74  Pulse: 52 60 58 51  Temp: 97.7 F (36.5 C)  98 F (36.7 C) 97.7 F (36.5 C)  TempSrc: Oral  Oral Oral  Resp: 20 21 20 18   Height:      Weight:      SpO2: 96% 98% 98% 99%   Weight change:   Intake/Output Summary (Last 24 hours) at 10/07/11 1722 Last data filed at 10/07/11 1349  Gross per 24 hour  Intake   2690 ml  Output      0 ml  Net   2690 ml    General: Alert, awake, oriented x3. Very energetic-appearing today.  HEENT: [place]/AT PEER[place], EOMI Neck: Trachea midline,  no masses, no thyromegal,y no JVD, no carotid bruit OROPHARYNX:  Moist, No exudate/ erythema/lesions.  Heart: Regular rate and rhythm with occasional PVCs on telemetry Lungs: Clear to auscultation,.  Abdomen: Soft, nontender, nondistended, positive bowel sounds.  Musculoskeletal: No warm swelling or erythema around joints, no spinal tenderness noted.    Lab Results:  Basename 10/07/11 0450 10/06/11 0500  NA 138 137  K 3.6 3.1*  CL 108 103  CO2 21 23  GLUCOSE 109* 101*  BUN 8 10  CREATININE 1.10 1.25  CALCIUM 8.4 8.4  MG -- 1.6  PHOS -- --    Basename 10/05/11 0500  AST 14  ALT 10  ALKPHOS 95  BILITOT 0.5  PROT 7.7  ALBUMIN 3.5   No results found for this basename: LIPASE:2,AMYLASE:2 in the last 72 hours No results found for this basename: WBC:2,NEUTROABS:2,HGB:2,HCT:2,MCV:2,PLT:2 in the last 72 hours No results  found for this basename: CKTOTAL:3,CKMB:3,CKMBINDEX:3,TROPONINI:3 in the last 72 hours No components found with this basename: POCBNP:3 No results found for this basename: DDIMER:2 in the last 72 hours No results found for this basename: HGBA1C:2 in the last 72 hours No results found for this basename: CHOL:2,HDL:2,LDLCALC:2,TRIG:2,CHOLHDL:2,LDLDIRECT:2 in the last 72 hours No results found for this basename: TSH,T4TOTAL,FREET3,T3FREE,THYROIDAB in the last 72 hours No results found for this basename: VITAMINB12:2,FOLATE:2,FERRITIN:2,TIBC:2,IRON:2,RETICCTPCT:2 in the last 72 hours  Micro Results: No results found for this or any previous visit (from the past 240 hour(s)).  Studies/Results: Nm Hepatobiliary  10/05/2011  *RADIOLOGY REPORT*  Clinical Data: Intractable nausea and vomiting, known cholelithiasis  NUCLEAR MEDICINE HEPATOHBILIARY INCLUDE GB  Radiopharmaceutical:  5.3 mCi technetium 76m Choletec  Comparison: CT abdomen pelvis of 06/30/2011  Findings: The patient was injected with 5.3 mCi of technetium 30m Choletec intravenously and imaging over the upper abdomen was performed.  Over the first 50 minutes, the radionuclide is excreted by the kidney and there is visualization of the common bile duct and small bowel.  However, the gallbladder fills on a delayed basis at approximately 1 hour after beginning the study.  This is nonspecific but can be seen with chronic cholecystitis.  However this visualization of the gallbladder does indicate cystic duct patency.  IMPRESSION: Delayed  visualization of the gallbladder may indicate chronic cholecystitis, but the cystic duct is patent.   Original Report Authenticated By: Juline Patch, M.D.    Dg Chest Port 1 View  10/04/2011  *RADIOLOGY REPORT*  Clinical Data: Intractable nausea, vomiting.  PORTABLE CHEST - 1 VIEW  Comparison: 08/08/2011  Findings: Cardiomegaly.  Calcified right granuloma and right hilar/mediastinal lymph nodes.  Scarring in the lung  bases bilaterally.  No acute infiltrates or effusions.  IMPRESSION: Cardiomegaly/chronic changes.  No active disease.   Original Report Authenticated By: Cyndie Chime, M.D.    Dg Abd Portable 1v  10/04/2011  *RADIOLOGY REPORT*  Clinical Data: Nausea and vomiting  PORTABLE ABDOMEN - 1 VIEW  Comparison: None.  Findings: The bowel gas pattern is within normal limits.  No obstruction or free air is seen on this supine examination.  There are laminated gallstones in the right upper quadrant.  There is lumbar levoscoliosis. There is a calcified granuloma in the right lower lobe region.  There are also right hilar and subcarinal calcified lymph nodes, consistent with prior granulomatous disease.  IMPRESSION: Nonspecific gas pattern.  Laminated gallstones in the right upper quadrant.   Original Report Authenticated By: Arvin Collard. WOODRUFF III, M.D.     Medications: I have reviewed the patient's current medications. Scheduled Meds:    . amLODipine  10 mg Oral Daily  . aspirin EC  81 mg Oral Daily  . atorvastatin  10 mg Oral Daily  . cloNIDine  0.2 mg Oral TID  . fluticasone  2 spray Each Nare Daily  . lidocaine  1 patch Transdermal Q24H  . LORazepam  1 mg Intravenous Once  . magnesium sulfate 1 - 4 g bolus IVPB  2 g Intravenous Once  . metoprolol succinate  25 mg Oral Daily  . ondansetron (ZOFRAN) IV  4 mg Intravenous Q6H  . pantoprazole  40 mg Oral Daily  . potassium chloride  40 mEq Oral Q3H  . spironolactone  25 mg Oral Daily   Continuous Infusions:    . 0.9 % NaCl with KCl 20 mEq / L 150 mL/hr at 10/07/11 1104   PRN Meds:.acetaminophen, feeding supplement, hydrALAZINE, LORazepam, naproxen, polyethylene glycol, promethazine, traMADol Assessment/Plan: Patient Active Hospital Problem List: Nausea & vomiting:   -this seems to have resolved patient is tolerating diet  Hypokalemia:   -Will give oral potassium.  Hypomagnesium -Replete by IV  ARF (acute renal failure): -Resolved    Narcotic withdrawal: -The patient does not want to take any narcotics and this likely is causing some narcotic withdrawal contributing to the nausea and vomiting.     Dehydration: -Continue IV fluid until tolerating full diet then discontinue     Tachycardia: -Associated with pain  -Manage pain     Chronic low back pain: -Patient's pain is still inadequately controlled. We will try tramadol through the day today. If the patient's pain is controlled on tramadol and Tylenol discharging him home on that regimen tomorrow. -Continue lidocaine       LOS: 5 days

## 2011-10-07 NOTE — Progress Notes (Addendum)
Asked patient if he would like a shower or to wash up before going to bed for the night and patient declined stating that he "washed up" ealier today.  MCCLAIN, Zacharias Ridling L 10/07/2011 9:55 PM

## 2011-10-07 NOTE — Evaluation (Signed)
Physical Therapy Evaluation Patient Details Name: John Brandt MRN: [id number] DOB: Jun 20, 1950 Today's Date: 10/07/2011 Time: 1115-1140 PT Time Calculation (min): 25 min  PT Assessment / Plan / Recommendation Clinical Impression  61 yo male admitted with septicemia. 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 Pt had PT eval 10/03/11 but now is off narcotics and PT reordered to re-assess mobility. Pt reports he has been mobilizing in room without assistance. Supervision-Mod I level on eval. Recommended pt ambulate in hallway with RW (RW decreased LBP with walking) with family/nursing supervision during stay. Pt reports he plans to have ouptatient PT after discharge to address back pain , postural issues. No further acute PT needs. RW recommended for home. 1x eval. PT will sign off.     PT Assessment  All further PT needs can be met in the next venue of care    Follow Up Recommendations  Outpatient PT    Barriers to Discharge        Equipment Recommendations  Rolling walker with 5" wheels    Recommendations for Other Services     Frequency      Precautions / Restrictions Precautions Precautions: None Restrictions Weight Bearing Restrictions: No   Pertinent Vitals/Pain *6/10 R LBP at rest 9/10 with walking without walker Pt premedicated, issued RW for walking to decrease pain with walking**      Mobility  Bed Mobility Bed Mobility: Not assessed Supine to Sit: Not tested (comment) Transfers Transfers: Sit to Stand;Stand to Sit Sit to Stand: 6: Modified independent (Device/Increase time);With armrests;From chair/3-in-1 Stand to Sit: 6: Modified independent (Device/Increase time);To chair/3-in-1;With upper extremity assist;With armrests Ambulation/Gait Ambulation/Gait Assistance: 6: Modified independent (Device/Increase time);7: Independent Ambulation Distance (Feet): 400 Feet (200' without AD, 200' with RW) Assistive device: Rolling walker;None Ambulation/Gait  Assistance Details: 200' without RW with 9/10 pain; 200' with RW with decreased pain Gait Pattern: Within Functional Limits General Gait Details: Pt reported increased comfort in low back with use of RW when walking Stairs: No    Shoulder Instructions     Exercises     PT Diagnosis: Acute pain  PT Problem List: Pain;Decreased activity tolerance PT Treatment Interventions:     PT Goals    Visit Information  Last PT Received On: 10/07/11 Assistance Needed: +1    Subjective Data  Subjective: I have anxiety about falling. Sometimes the pain is so intense when I first stand.  Patient Stated Goal: return to work as Equities trader at [employer]   Prior [employer] Living Lives With: Spouse Available Help at Discharge: Family Type of Home: House Home Access: Stairs to enter Secretary/administrator of Steps: 2 Entrance Stairs-Rails: None Bathroom Shower/Tub: Psychologist, counselling;Other (comment) (Grab bars) Bathroom Toilet:  (Comfort height toilets in house) Bathroom Accessibility: Yes Home Adaptive Equipment: Straight cane;Grab bars in shower;Other (comment) (Comfort height toilets) Prior Function Level of Independence: Independent with assistive device(s) (with straight cane) Able to Take Stairs?: Yes Driving: Yes Vocation: Full time employment Comments: Equities trader at [employer] Communication: No difficulties Dominant Hand: Right    Cognition  Overall Cognitive Status: Appears within functional limits for tasks assessed/performed Arousal/Alertness: Awake/alert Orientation Level: Appears intact for tasks assessed Behavior During Session: [facility] for tasks performed    Extremity/Trunk Assessment Right Upper Extremity Assessment RUE ROM/Strength/Tone: Within functional levels Left Upper Extremity Assessment LUE ROM/Strength/Tone: Within functional levels Right Lower Extremity Assessment RLE ROM/Strength/Tone: Within functional levels RLE Sensation:  WFL - Light Touch RLE Coordination: WFL - gross/fine  motor Left Lower Extremity Assessment LLE ROM/Strength/Tone: Within functional levels LLE Sensation: WFL - Light Touch LLE Coordination: WFL - gross/fine motor Trunk Assessment Trunk Assessment: Normal   Balance    End of Session PT - End of Session Activity Tolerance: Patient tolerated treatment well Patient left: in chair;with call bell/phone within reach  GP     Tamala Ser 10/07/2011, 11:51 AM  [phone]

## 2011-10-08 MED ORDER — NAPROXEN 500 MG PO TABS: 500.0000 mg | ORAL_TABLET | Freq: Two times a day (BID) | ORAL | Status: DC | PRN

## 2011-10-08 MED ORDER — TRAMADOL HCL 50 MG PO TABS: 50.0000 mg | ORAL_TABLET | Freq: Four times a day (QID) | ORAL | Status: DC | PRN

## 2011-10-08 MED ORDER — LIDOCAINE 5 % EX PTCH: 1.0000 | MEDICATED_PATCH | CUTANEOUS | Status: DC

## 2011-10-08 NOTE — Progress Notes (Signed)
After waiting to receive his walker; which was just delivered.  After reviewing d/c instructions, and he signs for same; he is taken to his vehicle without incident.

## 2011-10-08 NOTE — Care Management Note (Signed)
**Note John Brandt-Identified via Obfuscation**     Page 1 of 1   10/08/2011     10:19:29 AM   CARE MANAGEMENT NOTE 10/08/2011  Patient:  John Brandt, John Brandt   Account Number:  [id number]  Date Initiated:  10/06/2011  Documentation initiated by:  [facility]  Subjective/Objective Assessment:   61 YEAR OLD MALE ADMITTED WITH NAUSEA, VOMITING, DIARRHEA.     Action/Plan:   PT WILL D/C HOME ONCE MEDICALLY STABLE.   Anticipated DC Date:  10/09/2011   Anticipated DC Plan:  HOME/SELF CARE      DC Planning Services  CM consult      PAC Choice  DURABLE MEDICAL EQUIPMENT   Choice offered to / List presented to:     DME arranged  Levan Hurst      DME agency  [facility]        Status of service:  Completed, signed off Medicare Important Message given?   (If response is "NO", the following Medicare IM given date fields will be blank) Date Medicare IM given:   Date Additional Medicare IM given:    Discharge Disposition:  HOME/SELF CARE  Per UR Regulation:  Reviewed for med. necessity/level of care/duration of stay  If discussed at Long Length of Stay Meetings, dates discussed:    Comments:  10/08/2011  10:05am  Konrad Felix RN, case manager   [phone] Rec'd call from [facility] wondering where rolling walker was for patient who was getting ready to walk out the door. Noticed order was not placed until this morning at 9:31am. [facility] who indicated they would have walker delivered to patient room in half hour.

## 2011-10-10 ENCOUNTER — Encounter: Payer: Self-pay | Admitting: Infectious Disease

## 2011-10-14 ENCOUNTER — Encounter: Payer: Self-pay | Admitting: Infectious Disease

## 2011-10-20 ENCOUNTER — Other Ambulatory Visit: Payer: Self-pay | Admitting: Neurosurgery

## 2011-10-20 DIAGNOSIS — G062 Extradural and subdural abscess, unspecified: Secondary | ICD-10-CM

## 2011-10-26 ENCOUNTER — Other Ambulatory Visit: Payer: Self-pay | Admitting: Neurosurgery

## 2011-10-26 DIAGNOSIS — G062 Extradural and subdural abscess, unspecified: Secondary | ICD-10-CM

## 2011-10-27 ENCOUNTER — Other Ambulatory Visit: Payer: BC Managed Care – PPO

## 2011-10-27 ENCOUNTER — Inpatient Hospital Stay (HOSPITAL_COMMUNITY): Payer: BC Managed Care – PPO

## 2011-10-27 ENCOUNTER — Emergency Department (HOSPITAL_COMMUNITY): Payer: BC Managed Care – PPO

## 2011-10-27 ENCOUNTER — Encounter (HOSPITAL_COMMUNITY): Payer: Self-pay | Admitting: *Deleted

## 2011-10-27 ENCOUNTER — Inpatient Hospital Stay (HOSPITAL_COMMUNITY)
Admission: EM | Admit: 2011-10-27 | Discharge: 2011-11-03 | DRG: 020 | Disposition: A | Discharge status: Home Health | Payer: BC Managed Care – PPO | Attending: Internal Medicine | Admitting: Internal Medicine

## 2011-10-27 ENCOUNTER — Inpatient Hospital Stay
Admission: RE | Admit: 2011-10-27 | Discharge: 2011-10-27 | Payer: BC Managed Care – PPO | Source: Ambulatory Visit | Attending: Neurosurgery | Admitting: Neurosurgery

## 2011-10-27 DIAGNOSIS — R001 Bradycardia, unspecified: Secondary | ICD-10-CM

## 2011-10-27 DIAGNOSIS — M4620 Osteomyelitis of vertebra, site unspecified: Secondary | ICD-10-CM | POA: Diagnosis present

## 2011-10-27 DIAGNOSIS — Z791 Long term (current) use of non-steroidal anti-inflammatories (NSAID): Secondary | ICD-10-CM

## 2011-10-27 DIAGNOSIS — K8010 Calculus of gallbladder with chronic cholecystitis without obstruction: Secondary | ICD-10-CM | POA: Diagnosis present

## 2011-10-27 DIAGNOSIS — J309 Allergic rhinitis, unspecified: Secondary | ICD-10-CM | POA: Diagnosis present

## 2011-10-27 DIAGNOSIS — R790 Abnormal level of blood mineral: Secondary | ICD-10-CM

## 2011-10-27 DIAGNOSIS — G8929 Other chronic pain: Secondary | ICD-10-CM | POA: Diagnosis present

## 2011-10-27 DIAGNOSIS — K449 Diaphragmatic hernia without obstruction or gangrene: Secondary | ICD-10-CM | POA: Diagnosis present

## 2011-10-27 DIAGNOSIS — D638 Anemia in other chronic diseases classified elsewhere: Secondary | ICD-10-CM | POA: Diagnosis present

## 2011-10-27 DIAGNOSIS — K2990 Gastroduodenitis, unspecified, without bleeding: Secondary | ICD-10-CM | POA: Diagnosis present

## 2011-10-27 DIAGNOSIS — K760 Fatty (change of) liver, not elsewhere classified: Secondary | ICD-10-CM

## 2011-10-27 DIAGNOSIS — G061 Intraspinal abscess and granuloma: Principal | ICD-10-CM | POA: Diagnosis present

## 2011-10-27 DIAGNOSIS — F1123 Opioid dependence with withdrawal: Secondary | ICD-10-CM

## 2011-10-27 DIAGNOSIS — K812 Acute cholecystitis with chronic cholecystitis: Secondary | ICD-10-CM | POA: Diagnosis present

## 2011-10-27 DIAGNOSIS — R1110 Vomiting, unspecified: Secondary | ICD-10-CM

## 2011-10-27 DIAGNOSIS — E872 Acidosis, unspecified: Secondary | ICD-10-CM

## 2011-10-27 DIAGNOSIS — R000 Tachycardia, unspecified: Secondary | ICD-10-CM

## 2011-10-27 DIAGNOSIS — K2980 Duodenitis without bleeding: Secondary | ICD-10-CM | POA: Diagnosis present

## 2011-10-27 DIAGNOSIS — Z79899 Other long term (current) drug therapy: Secondary | ICD-10-CM

## 2011-10-27 DIAGNOSIS — K2970 Gastritis, unspecified, without bleeding: Secondary | ICD-10-CM | POA: Diagnosis present

## 2011-10-27 DIAGNOSIS — Z6833 Body mass index (BMI) 33.0-33.9, adult: Secondary | ICD-10-CM

## 2011-10-27 DIAGNOSIS — G4700 Insomnia, unspecified: Secondary | ICD-10-CM

## 2011-10-27 DIAGNOSIS — L270 Generalized skin eruption due to drugs and medicaments taken internally: Secondary | ICD-10-CM

## 2011-10-27 DIAGNOSIS — R634 Abnormal weight loss: Secondary | ICD-10-CM | POA: Diagnosis present

## 2011-10-27 DIAGNOSIS — A4101 Sepsis due to Methicillin susceptible Staphylococcus aureus: Secondary | ICD-10-CM

## 2011-10-27 DIAGNOSIS — F3289 Other specified depressive episodes: Secondary | ICD-10-CM | POA: Diagnosis present

## 2011-10-27 DIAGNOSIS — Z792 Long term (current) use of antibiotics: Secondary | ICD-10-CM

## 2011-10-27 DIAGNOSIS — E785 Hyperlipidemia, unspecified: Secondary | ICD-10-CM | POA: Diagnosis present

## 2011-10-27 DIAGNOSIS — N179 Acute kidney failure, unspecified: Secondary | ICD-10-CM

## 2011-10-27 DIAGNOSIS — F329 Major depressive disorder, single episode, unspecified: Secondary | ICD-10-CM | POA: Diagnosis present

## 2011-10-27 DIAGNOSIS — E860 Dehydration: Secondary | ICD-10-CM | POA: Diagnosis present

## 2011-10-27 DIAGNOSIS — M4640 Discitis, unspecified, site unspecified: Secondary | ICD-10-CM

## 2011-10-27 DIAGNOSIS — R222 Localized swelling, mass and lump, trunk: Secondary | ICD-10-CM

## 2011-10-27 DIAGNOSIS — A4901 Methicillin susceptible Staphylococcus aureus infection, unspecified site: Secondary | ICD-10-CM | POA: Diagnosis present

## 2011-10-27 DIAGNOSIS — M869 Osteomyelitis, unspecified: Secondary | ICD-10-CM | POA: Diagnosis present

## 2011-10-27 DIAGNOSIS — K259 Gastric ulcer, unspecified as acute or chronic, without hemorrhage or perforation: Secondary | ICD-10-CM | POA: Diagnosis present

## 2011-10-27 DIAGNOSIS — K828 Other specified diseases of gallbladder: Secondary | ICD-10-CM | POA: Diagnosis present

## 2011-10-27 DIAGNOSIS — Z888 Allergy status to other drugs, medicaments and biological substances status: Secondary | ICD-10-CM

## 2011-10-27 DIAGNOSIS — K8000 Calculus of gallbladder with acute cholecystitis without obstruction: Secondary | ICD-10-CM | POA: Diagnosis present

## 2011-10-27 DIAGNOSIS — R112 Nausea with vomiting, unspecified: Secondary | ICD-10-CM | POA: Diagnosis present

## 2011-10-27 DIAGNOSIS — Z880 Allergy status to penicillin: Secondary | ICD-10-CM

## 2011-10-27 DIAGNOSIS — D72829 Elevated white blood cell count, unspecified: Secondary | ICD-10-CM | POA: Diagnosis present

## 2011-10-27 DIAGNOSIS — K7689 Other specified diseases of liver: Secondary | ICD-10-CM | POA: Diagnosis present

## 2011-10-27 DIAGNOSIS — K2100 Gastro-esophageal reflux disease with esophagitis, without bleeding: Secondary | ICD-10-CM | POA: Diagnosis present

## 2011-10-27 DIAGNOSIS — M519 Unspecified thoracic, thoracolumbar and lumbosacral intervertebral disc disorder: Secondary | ICD-10-CM | POA: Diagnosis present

## 2011-10-27 DIAGNOSIS — E876 Hypokalemia: Secondary | ICD-10-CM | POA: Diagnosis present

## 2011-10-27 DIAGNOSIS — G4730 Sleep apnea, unspecified: Secondary | ICD-10-CM | POA: Diagnosis present

## 2011-10-27 DIAGNOSIS — K269 Duodenal ulcer, unspecified as acute or chronic, without hemorrhage or perforation: Secondary | ICD-10-CM | POA: Diagnosis present

## 2011-10-27 DIAGNOSIS — G4733 Obstructive sleep apnea (adult) (pediatric): Secondary | ICD-10-CM

## 2011-10-27 DIAGNOSIS — I10 Essential (primary) hypertension: Secondary | ICD-10-CM

## 2011-10-27 DIAGNOSIS — R1115 Cyclical vomiting syndrome unrelated to migraine: Secondary | ICD-10-CM

## 2011-10-27 LAB — URINALYSIS, ROUTINE W REFLEX MICROSCOPIC
Hgb urine dipstick: NEGATIVE
Nitrite: NEGATIVE
Protein, ur: 30 mg/dL — AB
Specific Gravity, Urine: 1.02 (ref 1.005–1.030)
Urobilinogen, UA: 0.2 mg/dL (ref 0.0–1.0)

## 2011-10-27 LAB — LIPASE, BLOOD: Lipase: 30 U/L (ref 11–59)

## 2011-10-27 LAB — URINE MICROSCOPIC-ADD ON

## 2011-10-27 LAB — BASIC METABOLIC PANEL
BUN: 13 mg/dL (ref 6–23)
Chloride: 102 mEq/L (ref 96–112)
GFR calc Af Amer: 81 mL/min — ABNORMAL LOW (ref >90)
GFR calc non Af Amer: 70 mL/min — ABNORMAL LOW (ref >90)
Potassium: 3.4 mEq/L — ABNORMAL LOW (ref 3.5–5.1)
Sodium: 138 mEq/L (ref 135–145)

## 2011-10-27 LAB — CBC WITH DIFFERENTIAL/PLATELET
Basophils Absolute: 0 10*3/uL (ref 0.0–0.1)
Eosinophils Absolute: 0.1 10*3/uL (ref 0.0–0.7)
Eosinophils Relative: 0 % (ref 0–5)
Lymphocytes Relative: 8 % — ABNORMAL LOW (ref 12–46)
MCV: 79.2 fL (ref 78.0–100.0)
Neutrophils Relative %: 86 % — ABNORMAL HIGH (ref 43–77)
Platelets: 359 10*3/uL (ref 150–400)
RBC: 4.9 MIL/uL (ref 4.22–5.81)
RDW: 15.8 % — ABNORMAL HIGH (ref 11.5–15.5)
WBC: 15.6 10*3/uL — ABNORMAL HIGH (ref 4.0–10.5)

## 2011-10-27 LAB — COMPREHENSIVE METABOLIC PANEL
ALT: 12 U/L (ref 0–53)
BUN: 15 mg/dL (ref 6–23)
CO2: 16 mEq/L — ABNORMAL LOW (ref 19–32)
Calcium: 10.1 mg/dL (ref 8.4–10.5)
Creatinine, Ser: 1.11 mg/dL (ref 0.50–1.35)
GFR calc Af Amer: 80 mL/min — ABNORMAL LOW (ref >90)
GFR calc non Af Amer: 69 mL/min — ABNORMAL LOW (ref >90)
Glucose, Bld: 94 mg/dL (ref 70–99)
Sodium: 134 mEq/L — ABNORMAL LOW (ref 135–145)
Total Protein: 8.4 g/dL — ABNORMAL HIGH (ref 6.0–8.3)

## 2011-10-27 LAB — MAGNESIUM: Magnesium: 1.8 mg/dL (ref 1.5–2.5)

## 2011-10-27 MED ORDER — CLONIDINE HCL 0.1 MG PO TABS
0.1000 mg | ORAL_TABLET | Freq: Once | ORAL | Status: AC
  Administered 2011-10-27: 0.1 mg via ORAL
  Filled 2011-10-27: qty 1

## 2011-10-27 MED ORDER — POTASSIUM CHLORIDE 10 MEQ/100ML IV SOLN
10.0000 meq | INTRAVENOUS | Status: AC
  Administered 2011-10-27 (×2): 10 meq via INTRAVENOUS
  Filled 2011-10-27 (×2): qty 100

## 2011-10-27 MED ORDER — SODIUM CHLORIDE 0.9 % IV SOLN
INTRAVENOUS | Status: DC
  Administered 2011-10-27 – 2011-10-29 (×2): via INTRAVENOUS

## 2011-10-27 MED ORDER — PANTOPRAZOLE SODIUM 40 MG IV SOLR
40.0000 mg | Freq: Two times a day (BID) | INTRAVENOUS | Status: DC
  Administered 2011-10-27 – 2011-11-01 (×10): 40 mg via INTRAVENOUS
  Filled 2011-10-27 (×11): qty 40

## 2011-10-27 MED ORDER — SODIUM CHLORIDE 0.9 % IV BOLUS (SEPSIS)
1000.0000 mL | Freq: Once | INTRAVENOUS | Status: AC
  Administered 2011-10-27: 1000 mL via INTRAVENOUS

## 2011-10-27 MED ORDER — SODIUM CHLORIDE 0.9 % IV SOLN
INTRAVENOUS | Status: DC
  Administered 2011-10-27: 17:00:00 via INTRAVENOUS

## 2011-10-27 MED ORDER — ONDANSETRON HCL 4 MG PO TABS: 4.0000 mg | ORAL_TABLET | Freq: Four times a day (QID) | ORAL | Status: DC | PRN

## 2011-10-27 MED ORDER — SODIUM CHLORIDE 0.9 % IV BOLUS (SEPSIS)
500.0000 mL | Freq: Once | INTRAVENOUS | Status: AC
  Administered 2011-10-27: 500 mL via INTRAVENOUS

## 2011-10-27 MED ORDER — ONDANSETRON 4 MG PO TBDP: 4.0000 mg | ORAL_TABLET | Freq: Three times a day (TID) | ORAL | Status: DC | PRN

## 2011-10-27 MED ORDER — MORPHINE SULFATE 4 MG/ML IJ SOLN
4.0000 mg | Freq: Once | INTRAMUSCULAR | Status: AC
  Administered 2011-10-27: 4 mg via INTRAVENOUS
  Filled 2011-10-27: qty 1

## 2011-10-27 MED ORDER — ONDANSETRON HCL 4 MG/2ML IJ SOLN
4.0000 mg | Freq: Once | INTRAMUSCULAR | Status: AC
  Administered 2011-10-27: 4 mg via INTRAVENOUS
  Filled 2011-10-27: qty 2

## 2011-10-27 MED ORDER — LIDOCAINE 5 % EX PTCH
1.0000 | MEDICATED_PATCH | CUTANEOUS | Status: DC
  Administered 2011-10-27 – 2011-11-02 (×7): 1 via TRANSDERMAL
  Filled 2011-10-27 (×10): qty 1

## 2011-10-27 MED ORDER — ONDANSETRON HCL 4 MG/2ML IJ SOLN
4.0000 mg | Freq: Four times a day (QID) | INTRAMUSCULAR | Status: DC | PRN
  Administered 2011-10-27 – 2011-10-29 (×4): 4 mg via INTRAVENOUS
  Filled 2011-10-27 (×6): qty 2

## 2011-10-27 MED ORDER — METOCLOPRAMIDE HCL 5 MG/ML IJ SOLN
10.0000 mg | Freq: Once | INTRAMUSCULAR | Status: AC
  Administered 2011-10-27: 10 mg via INTRAVENOUS
  Filled 2011-10-27: qty 2

## 2011-10-27 MED ORDER — TRAMADOL HCL 50 MG PO TABS: 50.0000 mg | ORAL_TABLET | Freq: Four times a day (QID) | ORAL | Status: DC | PRN

## 2011-10-27 MED ORDER — ADULT MULTIVITAMIN W/MINERALS CH
1.0000 | ORAL_TABLET | Freq: Every day | ORAL | Status: DC
  Administered 2011-10-27 – 2011-11-03 (×6): 1 via ORAL
  Filled 2011-10-27 (×8): qty 1

## 2011-10-27 MED ORDER — SENNOSIDES-DOCUSATE SODIUM 8.6-50 MG PO TABS
1.0000 | ORAL_TABLET | Freq: Every evening | ORAL | Status: DC | PRN
  Filled 2011-10-27: qty 1

## 2011-10-27 NOTE — ED Notes (Signed)
Pt states had a staph infection near his spine and had an open biopsy in august, since then has been dealing w/ vomiting, states was here the last of September for it, went to PCP this morning and was sent over here d/t dehydration and vomiting. Pt has been vomiting since yesterday morning, denies diarrhea.

## 2011-10-27 NOTE — ED Notes (Signed)
Called to give report nurse unavailable will call back.  

## 2011-10-27 NOTE — H&P (Signed)
[facility]          History and Physical    PCP:   Londell Moh, MD   Chief Complaint:  N/V  HPI: Pleasant 61 y/o man who presents with a 2 day history of intractable nausea and vomiting. He has vomited at least 15 times. Emesis is brown/black colored. When asked he does admit to having some dark stools, but has not noticed any frank blood. He has been taking naproxen twice a day pretty consistently. He has had multiple admissions for these issues and always has significant electrolyte abnormalities and ARF that corroborate his history of severe n/v. His PMH is significant for an MSSA Epidural abscess for which he completed 6 weeks of IV antibiotics under the care of Dr. Daiva Eves. Has had a 50 pound weight loss since July (unintentional). In the ED he is found to be hypokalemic with metabolic acidosis and we are asked to admit him for further evaluation and management. Upon questioning he admits to having epigastric pain, a sour taste in his mouth and frequent belching.  Allergies:   Allergies  Allergen Reactions  . Fish-Derived Products Nausea And Vomiting  . Cefazolin Rash    rash  . Iodine Rash  . Penicillins Rash      Past Medical History  Diagnosis Date  . Hypertension   . Hyperlipidemia   . Allergic rhinitis   . Hx of cardiac cath   . Complication of anesthesia   . PONV (postoperative nausea and vomiting)   . Sleep apnea     sleep study 07-02-02  . Chronic back pain   . 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 Vasectomy   . Dental implants   . Cardiac catheterization     more than 15 yrs ago  . Back surgery   . Open biopsy on back 08/08/2011    Prior to Admission medications   Medication Sig Start Date End Date Taking? Authorizing Provider  acetaminophen (TYLENOL) 500 MG tablet Take 1,000 mg by mouth every 6 (six) hours as  needed. For pain   Yes Historical Provider, MD  amLODipine (NORVASC) 10 MG tablet Take 10 mg by mouth daily.  01/22/11  Yes Historical Provider, MD  aspirin EC 81 MG tablet Take 81 mg by mouth daily.   Yes Historical Provider, MD  atorvastatin (LIPITOR) 10 MG tablet Take 10 mg by mouth daily.  12/19/10  Yes Historical Provider, MD  cloNIDine (CATAPRES) 0.2 MG tablet Take 1 tablet (0.2 mg total) by mouth 3 (three) times daily. Hold unless BP > 140 when checked 07/02/11  Yes Jessica U Vann, DO  lidocaine (LIDODERM) 5 % Place 1 patch onto the skin daily. Remove & Discard patch within 12 hours or as directed by MD 10/08/11  Yes Altha Harm, MD  metoprolol succinate (TOPROL-XL) 50 MG 24 hr tablet Take 50 mg by mouth daily. Take with or immediately following a meal.   Yes Historical Provider, MD  Multiple Vitamin (MULTIVITAMIN WITH MINERALS) TABS Take 1 tablet by mouth daily.   Yes Historical Provider, MD  naproxen (NAPROSYN) 500 MG tablet Take 1 tablet (500 mg total) by mouth every 12 (twelve) hours as needed ( Pain-Take with food ). 10/08/11  Yes Altha Harm, MD  ondansetron (ZOFRAN) 8 MG tablet Take 8 mg by mouth  every 8 (eight) hours as needed. For nausea.   Yes Historical Provider, MD  polyethylene glycol (MIRALAX / GLYCOLAX) packet Take 17 g by mouth daily as needed. For constipation.   Yes Historical Provider, MD  potassium chloride SA (K-DUR,KLOR-CON) 20 MEQ tablet Take 40 mEq by mouth 3 (three) times daily.   Yes Historical Provider, MD  spironolactone (ALDACTONE) 25 MG tablet Take 25 mg by mouth daily.  02/28/11  Yes Historical Provider, MD  triamcinolone (NASACORT) 55 MCG/ACT nasal inhaler Place 2 sprays into the nose daily. 2 sprays in each nostril daily 12/15/10  Yes Historical Provider, MD  vitamin C (ASCORBIC ACID) 500 MG tablet Take 500 mg by mouth daily.   Yes Historical Provider, MD  Vitamin D, Ergocalciferol, (DRISDOL) 50000 UNITS CAPS Take 50,000 Units by mouth every 7 (seven)  days. Taken on Saturdays. 02/26/11  Yes Historical Provider, MD  ondansetron (ZOFRAN ODT) 4 MG disintegrating tablet Take 1 tablet (4 mg total) by mouth every 8 (eight) hours as needed for nausea. 10/27/11   Loren Racer, MD  traMADol (ULTRAM) 50 MG tablet Take 1 tablet (50 mg total) by mouth every 6 (six) hours as needed. 10/27/11   Loren Racer, MD    Social History:  reports that he has never smoked. He has never used smokeless tobacco. He reports that he drinks alcohol. He reports that he does not use illicit drugs.  Family History  Problem Relation Age of Onset  . Allergies Mother   . Heart disease Mother   . Lung cancer Mother     mesothelioma  . Aneurysm Brother     brain  . Heart disease Father   . Lung cancer Father     Review of Systems:  Constitutional: Denies fever, chills, diaphoresis. HEENT: Denies photophobia, eye pain, redness, hearing loss, ear pain, congestion, sore throat, rhinorrhea, sneezing, mouth sores, trouble swallowing, neck pain, neck stiffness and tinnitus.   Respiratory: Denies SOB, DOE, cough, chest tightness,  and wheezing.   Cardiovascular: Denies chest pain, palpitations and leg swelling.  Gastrointestinal: Denies abdominal pain, diarrhea, constipation and abdominal distention.  Genitourinary: Denies dysuria, urgency, frequency, hematuria, flank pain and difficulty urinating.  Musculoskeletal: Denies myalgias, back pain, joint swelling, arthralgias and gait problem.  Skin: Denies pallor, rash and wound.  Neurological: Denies dizziness, seizures, syncope, weakness, light-headedness, numbness and headaches.  Hematological: Denies adenopathy. Easy bruising, personal or family bleeding history  Psychiatric/Behavioral: Denies suicidal ideation, mood changes, confusion, nervousness, sleep disturbance and agitation   Physical Exam: Blood pressure 147/95, pulse 96, temperature 98.6 F (37 C), temperature source Oral, resp. rate 19, SpO2 100.00%. Gen:  AAOx3, pleasant and cooperative to exam. HEENT: [place]/AT/PERRL/EOMI/wears corrective lenses, dry mucous membranes. Neck: no JVD, no LAD, no bruits, no goiter. CV: RRR, no M/R/G Lungs: CTA B Abd: obese, soft, nontender, nondistended, +BS, no masses or organomegaly noted. Ext: no C/C/E, +pedal pulses. Neuro: grossly intact and non-focal.  Labs on Admission:  Results for orders placed during the hospital encounter of 10/27/11 (from the past 48 hour(s))  COMPREHENSIVE METABOLIC PANEL     Status: Abnormal   Collection Time   10/27/11  1:05 PM      Component Value Range Comment   Sodium 134 (*) 135 - 145 mEq/L    Potassium 4.2  3.5 - 5.1 mEq/L    Chloride 99  96 - 112 mEq/L    CO2 16 (*) 19 - 32 mEq/L    Glucose, Bld 94  70 - 99 mg/dL  BUN 15  6 - 23 mg/dL    Creatinine, Ser 2.95  0.50 - 1.35 mg/dL    Calcium 28.4  8.4 - 10.5 mg/dL    Total Protein 8.4 (*) 6.0 - 8.3 g/dL    Albumin 3.3 (*) 3.5 - 5.2 g/dL    AST 18  0 - 37 U/L    ALT 12  0 - 53 U/L    Alkaline Phosphatase 139 (*) 39 - 117 U/L    Total Bilirubin 0.4  0.3 - 1.2 mg/dL    GFR calc non Af Amer 69 (*) >90 mL/min    GFR calc Af Amer 80 (*) >90 mL/min   LIPASE, BLOOD     Status: Normal   Collection Time   10/27/11  1:05 PM      Component Value Range Comment   Lipase 30  11 - 59 U/L   CBC WITH DIFFERENTIAL     Status: Abnormal   Collection Time   10/27/11  1:15 PM      Component Value Range Comment   WBC 15.6 (*) 4.0 - 10.5 K/uL    RBC 4.90  4.22 - 5.81 MIL/uL    Hemoglobin 13.1  13.0 - 17.0 g/dL    HCT 13.2 (*) 44.0 - 52.0 %    MCV 79.2  78.0 - 100.0 fL    MCH 26.7  26.0 - 34.0 pg    MCHC 33.8  30.0 - 36.0 g/dL    RDW 10.2 (*) 72.5 - 15.5 %    Platelets 359  150 - 400 K/uL    Neutrophils Relative 86 (*) 43 - 77 %    Neutro Abs 13.5 (*) 1.7 - 7.7 K/uL    Lymphocytes Relative 8 (*) 12 - 46 %    Lymphs Abs 1.3  0.7 - 4.0 K/uL    Monocytes Relative 5  3 - 12 %    Monocytes Absolute 0.8  0.1 - 1.0 K/uL     Eosinophils Relative 0  0 - 5 %    Eosinophils Absolute 0.1  0.0 - 0.7 K/uL    Basophils Relative 0  0 - 1 %    Basophils Absolute 0.0  0.0 - 0.1 K/uL   URINALYSIS, ROUTINE W REFLEX MICROSCOPIC     Status: Abnormal   Collection Time   10/27/11  1:40 PM      Component Value Range Comment   Color, Urine YELLOW  YELLOW    APPearance CLEAR  CLEAR    Specific Gravity, Urine 1.020  1.005 - 1.030    pH 8.0  5.0 - 8.0    Glucose, UA NEGATIVE  NEGATIVE mg/dL    Hgb urine dipstick NEGATIVE  NEGATIVE    Bilirubin Urine NEGATIVE  NEGATIVE    Ketones, ur 40 (*) NEGATIVE mg/dL    Protein, ur 30 (*) NEGATIVE mg/dL    Urobilinogen, UA 0.2  0.0 - 1.0 mg/dL    Nitrite NEGATIVE  NEGATIVE    Leukocytes, UA TRACE (*) NEGATIVE   URINE MICROSCOPIC-ADD ON     Status: Normal   Collection Time   10/27/11  1:40 PM      Component Value Range Comment   Squamous Epithelial / LPF RARE  RARE    WBC, UA 3-6  <3 WBC/hpf    Urine-Other MUCOUS PRESENT     BASIC METABOLIC PANEL     Status: Abnormal   Collection Time   10/27/11  4:05 PM  Component Value Range Comment   Sodium 138  135 - 145 mEq/L    Potassium 3.4 (*) 3.5 - 5.1 mEq/L    Chloride 102  96 - 112 mEq/L    CO2 18 (*) 19 - 32 mEq/L    Glucose, Bld 94  70 - 99 mg/dL    BUN 13  6 - 23 mg/dL    Creatinine, Ser 1.61  0.50 - 1.35 mg/dL    Calcium 9.4  8.4 - 09.6 mg/dL    GFR calc non Af Amer 70 (*) >90 mL/min    GFR calc Af Amer 81 (*) >90 mL/min     Radiological Exams on Admission: US Abdomen Complete  10/27/2011  *RADIOLOGY REPORT*  Clinical Data:  Abdominal pain.  Vomiting.  History of gallstones.  COMPLETE ABDOMINAL ULTRASOUND  Comparison:  Plain films of 10/04/2011.  CT of 06/30/2011.  No prior ultrasound.  Findings:  Gallbladder:  Gallbladder stones at the 1.3 cm.  No wall thickening or pericholecystic fluid. Sonographic Murphy's sign was not elicited.  Common bile duct: Normal, 2 mm.  Liver: Increased echogenicity.  IVC: Negative  Pancreas:   Poorly visualized due to overlying bowel gas.  Spleen:  Normal in size and echogenicity.  Right Kidney:  12.2 cm.  Right renal upper pole cysts measuring 1.4 and 1.7 cm respectively. No hydronephrosis.  Left Kidney:  14.9 cm. No hydronephrosis.  Dominant cyst measuring 8.9 cm.  Abdominal aorta:  Poorly visualized due to overlying bowel gas.  No aneurysm or ascites identified.  IMPRESSION: No acute process or explanation for vomiting or abdominal pain.  Cholelithiasis without cholecystitis.  Hepatic steatosis.   Original Report Authenticated By: Consuello Bossier, M.D.     Assessment/Plan Principal Problem:  *Intractable nausea and vomiting Active Problems:  Leukocytosis  Hypokalemia  Paraspinal abscess  Dehydration  Metabolic acidosis    Intractable n/v -Given his symptoms I think it is possible he may have gastritis/PUD causing his n/v. -Has never been on a PPI. -Will start on Protonix BID. -Have consulted GI (Dr. Juanda Chance) for a possible EGD in am to further visualize and assist with diagnosis. -Given his dark stools will guaiac. -Symptomatic treatment with IVF and antiemetics PRN.  High Anion Gap Metabolic Acidosis -No renal failure, no DKA, no toxic alcohol or ethanol ingestions. -Suspect related to starvation ketoacidosis. His weight loss would corroborate this. -Should correct with IVF, if not may need a bicarb drip. -Bicarb in the ED was 16 and has improved to 18 only with IVF.  MSSA Paraspinal Abscess -Has completed treatment for this with 6 weeks of IV antibiotics. -Was scheduled to have imaging today with Dr. Dutch Quint, however he had to come to the hospital instead. -Will need to contact Dr. Dutch Quint in am to see if we can do this imaging while in the hospital (patient says he was to have a ?myelogram).  DVT Prophylaxis -SCDs given possibility of low grade GI Bleed.  HTN -Hold all BP meds for now.  Hypokalemia -Mild. -Replete IV. -Check Mag level.    Time Spent on  Admission: 75 minutes.  Chaya Jan [facility] Pager: [phone] 10/27/2011, 6:33 PM

## 2011-10-27 NOTE — ED Provider Notes (Signed)
History     CSN: [id number]  Arrival date & time 10/27/11  1119   First MD Initiated Contact with Patient 10/27/11 1210      Chief Complaint  Patient presents with  . Emesis    (Consider location/radiation/quality/duration/timing/severity/associated sxs/prior treatment) HPI Pt with 2 days of multiple episodes of vomiting and chills. Pt has had 2 similar episodes. Saw his PMD this AM and referred to ED for rehydration. Pt stated he stopped taking his Ultram several days ago in preparation for MRI back today. He has R flank pain and has episodic abd pain. Pain and vomiting worse after eating. No fever. Pt with paraspinal abscess and diskitis this summer with extended IV ABX. Pt with no pain at prev site of abscess.   Past Medical History  Diagnosis Date  . Hypertension   . Hyperlipidemia   . Allergic rhinitis   . Hx of cardiac cath   . Complication of anesthesia   . PONV (postoperative nausea and vomiting)   . Sleep apnea     sleep study 07-02-02  . Chronic back pain   . 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 Vasectomy   . Dental implants   . Cardiac catheterization     more than 15 yrs ago  . Back surgery   . Open biopsy on back 08/08/2011    Family History  Problem Relation Age of Onset  . Allergies Mother   . Heart disease Mother   . Lung cancer Mother     mesothelioma  . Aneurysm Brother     brain  . Heart disease Father   . Lung cancer Father     History  Substance Use Topics  . Smoking status: Never Smoker   . Smokeless tobacco: Never Used  . Alcohol Use: Yes     rare use      Review of Systems  Constitutional: Positive for chills. Negative for fever.  Respiratory: Negative for shortness of breath.   Cardiovascular: Negative for chest pain.  Gastrointestinal: Positive for nausea, vomiting and abdominal pain. Negative for diarrhea and  constipation.  Genitourinary: Positive for flank pain. Negative for dysuria, frequency, hematuria and enuresis.  Musculoskeletal: Positive for back pain.  Skin: Negative for rash and wound.  Neurological: Negative for dizziness, weakness, light-headedness, numbness and headaches.    Allergies  Fish-derived products; Cefazolin; Iodine; and Penicillins  Home Medications   Current Outpatient Rx  Name Route Sig Dispense Refill  . ONDANSETRON 4 MG PO TBDP Oral Take 1 tablet (4 mg total) by mouth every 8 (eight) hours as needed for nausea. 20 tablet 0  . TRAMADOL HCL 50 MG PO TABS Oral Take 1 tablet (50 mg total) by mouth every 6 (six) hours as needed. 30 tablet 0    BP 169/89  Pulse 83  Temp 98.1 F (36.7 C) (Oral)  Resp 18  Ht 6\' 1"  (1.854 m)  Wt 252 lb 1.6 oz (114.352 kg)  BMI 33.26 kg/m2  SpO2 96%  Physical Exam  Nursing note and vitals reviewed. Constitutional: He is oriented to person, place, and time. He appears well-developed and well-nourished. No distress.  HENT:  Head: Normocephalic and atraumatic.  Mouth/Throat: Oropharynx is clear and moist.       Dry lips  Eyes: EOM are normal. Pupils are equal, round, and reactive  to light.  Neck: Normal range of motion. Neck supple.  Cardiovascular: Normal rate and regular rhythm.   Pulmonary/Chest: Effort normal and breath sounds normal. No respiratory distress. He has no wheezes. He has no rales.  Abdominal: Soft. Bowel sounds are normal. He exhibits no distension and no mass. There is tenderness. There is no rebound (very mild RUQ TTP. No rebound or guarding) and no guarding.  Musculoskeletal: Normal range of motion. He exhibits tenderness (mild R flank and R lumbar tenderness with palpation. No defromity, swelling or redness. Previous surgical site without tenderness, erythema ). He exhibits no edema.  Neurological: He is alert and oriented to person, place, and time.       5/5 motor in all ext, sensation intact  Skin: Skin is  warm and dry. No rash noted. No erythema.  Psychiatric: He has a normal mood and affect. His behavior is normal.    ED Course  Procedures (including critical care time)  Labs Reviewed  COMPREHENSIVE METABOLIC PANEL - Abnormal; Notable for the following:    Sodium 134 (*)     CO2 16 (*)     Total Protein 8.4 (*)     Albumin 3.3 (*)     Alkaline Phosphatase 139 (*)     GFR calc non Af Amer 69 (*)     GFR calc Af Amer 80 (*)     All other components within normal limits  URINALYSIS, ROUTINE W REFLEX MICROSCOPIC - Abnormal; Notable for the following:    Ketones, ur 40 (*)     Protein, ur 30 (*)     Leukocytes, UA TRACE (*)     All other components within normal limits  CBC WITH DIFFERENTIAL - Abnormal; Notable for the following:    WBC 15.6 (*)     HCT 38.8 (*)     RDW 15.8 (*)     Neutrophils Relative 86 (*)     Neutro Abs 13.5 (*)     Lymphocytes Relative 8 (*)     All other components within normal limits  BASIC METABOLIC PANEL - Abnormal; Notable for the following:    Potassium 3.4 (*)     CO2 18 (*)     GFR calc non Af Amer 70 (*)     GFR calc Af Amer 81 (*)     All other components within normal limits  LIPASE, BLOOD  URINE MICROSCOPIC-ADD ON  MAGNESIUM  CULTURE, BLOOD (ROUTINE X 2)  CULTURE, BLOOD (ROUTINE X 2)   Dg Chest 2 View  10/27/2011  *RADIOLOGY REPORT*  Clinical Data: Leukocytosis and weakness.  Question pneumonia.  CHEST - 2 VIEW  Comparison: One-view chest 10/04/2011. Two-view chest 08/02/2011. Two-view chest 09/18/2006.  Findings: Cardiac enlargement is stable.  There is no edema or effusion to suggest failure.  A granuloma in the lingula is stable. Right lateral pleural thickening is stable.  A superior endplate fracture at L1 and inferior endplate fracture of T12 are new since July.  IMPRESSION:  1.  Stable cardiomegaly without failure. 2.  New superior endplate fracture at L1 and inferior endplate fracture at T12.  There is focal kyphosis at this level.    Original Report Authenticated By: Jamesetta Orleans. MATTERN, M.D.    US Abdomen Complete  10/27/2011  *RADIOLOGY REPORT*  Clinical Data:  Abdominal pain.  Vomiting.  History of gallstones.  COMPLETE ABDOMINAL ULTRASOUND  Comparison:  Plain films of 10/04/2011.  CT of 06/30/2011.  No prior ultrasound.  Findings:  Gallbladder:  Gallbladder stones at the 1.3 cm.  No wall thickening or pericholecystic fluid. Sonographic Murphy's sign was not elicited.  Common bile duct: Normal, 2 mm.  Liver: Increased echogenicity.  IVC: Negative  Pancreas:  Poorly visualized due to overlying bowel gas.  Spleen:  Normal in size and echogenicity.  Right Kidney:  12.2 cm.  Right renal upper pole cysts measuring 1.4 and 1.7 cm respectively. No hydronephrosis.  Left Kidney:  14.9 cm. No hydronephrosis.  Dominant cyst measuring 8.9 cm.  Abdominal aorta:  Poorly visualized due to overlying bowel gas.  No aneurysm or ascites identified.  IMPRESSION: No acute process or explanation for vomiting or abdominal pain.  Cholelithiasis without cholecystitis.  Hepatic steatosis.   Original Report Authenticated By: Consuello Bossier, M.D.      1. Medication withdrawal   2. Dehydration   3. Vomiting   4. Hypokalemia   5. Intractable nausea and vomiting   6. Leukocytosis   7. Metabolic acidosis   8. Paraspinal abscess       MDM   Discussed with ID. Suggest sending blood cultures and will follow in clinic. No imaging at this time.        Loren Racer, MD 10/28/11 [phone]

## 2011-10-28 ENCOUNTER — Encounter (HOSPITAL_COMMUNITY): Payer: Self-pay

## 2011-10-28 ENCOUNTER — Encounter (HOSPITAL_COMMUNITY)
Admission: EM | Disposition: A | Discharge status: Home Health | Payer: Self-pay | Source: Home / Self Care | Attending: Internal Medicine

## 2011-10-28 ENCOUNTER — Inpatient Hospital Stay (HOSPITAL_COMMUNITY): Payer: BC Managed Care – PPO

## 2011-10-28 DIAGNOSIS — K2990 Gastroduodenitis, unspecified, without bleeding: Secondary | ICD-10-CM

## 2011-10-28 DIAGNOSIS — K269 Duodenal ulcer, unspecified as acute or chronic, without hemorrhage or perforation: Secondary | ICD-10-CM | POA: Diagnosis present

## 2011-10-28 DIAGNOSIS — K2970 Gastritis, unspecified, without bleeding: Secondary | ICD-10-CM | POA: Diagnosis present

## 2011-10-28 DIAGNOSIS — E860 Dehydration: Secondary | ICD-10-CM

## 2011-10-28 HISTORY — PX: ESOPHAGOGASTRODUODENOSCOPY: SHX5428

## 2011-10-28 LAB — GLUCOSE, CAPILLARY: Glucose-Capillary: 97 mg/dL (ref 70–99)

## 2011-10-28 SURGERY — EGD (ESOPHAGOGASTRODUODENOSCOPY)
Anesthesia: Moderate Sedation

## 2011-10-28 MED ORDER — TECHNETIUM TC 99M MEBROFENIN IV KIT
5.5000 | PACK | Freq: Once | INTRAVENOUS | Status: AC | PRN
  Administered 2011-10-28: 5.5 via INTRAVENOUS

## 2011-10-28 MED ORDER — METOPROLOL SUCCINATE ER 50 MG PO TB24
50.0000 mg | ORAL_TABLET | Freq: Every day | ORAL | Status: DC
  Administered 2011-10-28 – 2011-10-29 (×2): 50 mg via ORAL
  Filled 2011-10-28 (×3): qty 1

## 2011-10-28 MED ORDER — SODIUM CHLORIDE 0.9 % IV SOLN
INTRAVENOUS | Status: DC
  Administered 2011-10-28 (×2): via INTRAVENOUS

## 2011-10-28 MED ORDER — FENTANYL CITRATE 0.05 MG/ML IJ SOLN
INTRAMUSCULAR | Status: AC
  Filled 2011-10-28: qty 2

## 2011-10-28 MED ORDER — FENTANYL CITRATE 0.05 MG/ML IJ SOLN
12.5000 ug | Freq: Once | INTRAMUSCULAR | Status: AC
  Administered 2011-10-28: 12.5 ug via INTRAVENOUS

## 2011-10-28 MED ORDER — OXYCODONE-ACETAMINOPHEN 5-325 MG PO TABS
1.0000 | ORAL_TABLET | Freq: Four times a day (QID) | ORAL | Status: DC | PRN
  Administered 2011-10-28 – 2011-11-02 (×12): 1 via ORAL
  Filled 2011-10-28 (×7): qty 1
  Filled 2011-10-28: qty 2
  Filled 2011-10-28 (×3): qty 1
  Filled 2011-10-28: qty 2
  Filled 2011-10-28: qty 1

## 2011-10-28 MED ORDER — FENTANYL CITRATE 0.05 MG/ML IJ SOLN
INTRAMUSCULAR | Status: DC | PRN
  Administered 2011-10-28 (×4): 25 ug via INTRAVENOUS

## 2011-10-28 MED ORDER — PROMETHAZINE HCL 25 MG/ML IJ SOLN
25.0000 mg | Freq: Four times a day (QID) | INTRAMUSCULAR | Status: DC | PRN
  Administered 2011-10-29 – 2011-10-30 (×4): 25 mg via INTRAVENOUS
  Filled 2011-10-28 (×4): qty 1

## 2011-10-28 MED ORDER — SUCRALFATE 1 GM/10ML PO SUSP
1.0000 g | Freq: Four times a day (QID) | ORAL | Status: DC
  Administered 2011-10-28 – 2011-11-03 (×23): 1 g via ORAL
  Filled 2011-10-28 (×27): qty 10

## 2011-10-28 MED ORDER — ONDANSETRON HCL 4 MG/2ML IJ SOLN
4.0000 mg | INTRAMUSCULAR | Status: AC
  Administered 2011-10-28: 4 mg via INTRAVENOUS

## 2011-10-28 MED ORDER — MIDAZOLAM HCL 10 MG/2ML IJ SOLN
INTRAMUSCULAR | Status: DC | PRN
  Administered 2011-10-28: 1 mg via INTRAVENOUS
  Administered 2011-10-28 (×4): 2 mg via INTRAVENOUS

## 2011-10-28 MED ORDER — MIDAZOLAM HCL 10 MG/2ML IJ SOLN
INTRAMUSCULAR | Status: AC
  Filled 2011-10-28: qty 2

## 2011-10-28 MED ORDER — METOCLOPRAMIDE HCL 5 MG/ML IJ SOLN
10.0000 mg | Freq: Four times a day (QID) | INTRAMUSCULAR | Status: DC
  Administered 2011-10-28 – 2011-11-03 (×24): 10 mg via INTRAVENOUS
  Filled 2011-10-28 (×34): qty 2

## 2011-10-28 MED ORDER — PROMETHAZINE HCL 25 MG/ML IJ SOLN
12.5000 mg | Freq: Four times a day (QID) | INTRAMUSCULAR | Status: DC | PRN
  Administered 2011-10-28: 12.5 mg via INTRAVENOUS
  Filled 2011-10-28: qty 1

## 2011-10-28 MED ORDER — AMLODIPINE BESYLATE 10 MG PO TABS
10.0000 mg | ORAL_TABLET | Freq: Every day | ORAL | Status: DC
  Administered 2011-10-28 – 2011-11-03 (×7): 10 mg via ORAL
  Filled 2011-10-28 (×7): qty 1

## 2011-10-28 MED ORDER — PROMETHAZINE HCL 25 MG/ML IJ SOLN: 25.0000 mg | Freq: Four times a day (QID) | INTRAMUSCULAR | Status: DC | PRN

## 2011-10-28 MED ORDER — BUTAMBEN-TETRACAINE-BENZOCAINE 2-2-14 % EX AERO
INHALATION_SPRAY | CUTANEOUS | Status: DC | PRN
  Administered 2011-10-28: 2 via TOPICAL

## 2011-10-28 NOTE — Progress Notes (Signed)
An order was put in for pt to use CPAP at night. RT was unable to get a machine due to the usage of our stock. Will continue to look for one. Will call [facility] to see if they have any extra machines.

## 2011-10-28 NOTE — Consult Note (Signed)
[facility] Consultation  Referring Provider: No ref. provider found Primary Care Physician:  Londell Moh, MD Primary Gastroenterologist:  Does not have one  Reason for Consultation: intractable N&V, 3rd admission  HPI: John Brandt is a 61 y.o. male with the 3rd admission since May 2013 for severe N&V, dehydration and metabolic derangements. Mid back pain, , diagnosed with T12 abscess MSSA 05/2011, his N&V started around that time. He has dry heaves. Sx's get better when he is not active or asleep, worse when he is working ( he is a Arts administrator at [employer]). Decreased appetite, massive weight loss x 5 months. Denies heartburn. Has been on Naproxen and tramadol No hx of PUD. Bowl habits normal.Known cholelithiasis with nl CBD and gall bladder wall   Past Medical History  Diagnosis Date  . Hypertension   . Hyperlipidemia   . Allergic rhinitis   . Hx of cardiac cath   . Complication of anesthesia   . PONV (postoperative nausea and vomiting)   . Sleep apnea     sleep study 07-02-02  . Chronic back pain   . 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 Vasectomy   . Dental implants   . Cardiac catheterization     more than 15 yrs ago  . Back surgery   . Open biopsy on back 08/08/2011    Prior to Admission medications   Medication Sig Start Date End Date Taking? Authorizing Provider  acetaminophen (TYLENOL) 500 MG tablet Take 1,000 mg by mouth every 6 (six) hours as needed. For pain   Yes Historical Provider, MD  amLODipine (NORVASC) 10 MG tablet Take 10 mg by mouth daily.  01/22/11  Yes Historical Provider, MD  aspirin EC 81 MG tablet Take 81 mg by mouth daily.   Yes Historical Provider, MD  atorvastatin (LIPITOR) 10 MG tablet Take 10 mg by mouth daily.  12/19/10  Yes Historical Provider, MD  cloNIDine (CATAPRES) 0.2 MG tablet Take 1  tablet (0.2 mg total) by mouth 3 (three) times daily. Hold unless BP > 140 when checked 07/02/11  Yes Jessica U Vann, DO  lidocaine (LIDODERM) 5 % Place 1 patch onto the skin daily. Remove & Discard patch within 12 hours or as directed by MD 10/08/11  Yes Altha Harm, MD  metoprolol succinate (TOPROL-XL) 50 MG 24 hr tablet Take 50 mg by mouth daily. Take with or immediately following a meal.   Yes Historical Provider, MD  Multiple Vitamin (MULTIVITAMIN WITH MINERALS) TABS Take 1 tablet by mouth daily.   Yes Historical Provider, MD  naproxen (NAPROSYN) 500 MG tablet Take 1 tablet (500 mg total) by mouth every 12 (twelve) hours as needed ( Pain-Take with food ). 10/08/11  Yes Altha Harm, MD  ondansetron (ZOFRAN) 8 MG tablet Take 8 mg by mouth every 8 (eight) hours as needed. For nausea.   Yes Historical Provider, MD  polyethylene glycol (MIRALAX / GLYCOLAX) packet Take 17 g by mouth daily as needed. For constipation.   Yes Historical Provider, MD  potassium chloride SA (K-DUR,KLOR-CON) 20 MEQ tablet Take 40 mEq by mouth 3 (three) times daily.   Yes Historical Provider, MD  spironolactone (ALDACTONE) 25 MG tablet Take 25 mg by mouth daily.  02/28/11  Yes Historical Provider, MD  triamcinolone (NASACORT) 55 MCG/ACT nasal inhaler  Place 2 sprays into the nose daily. 2 sprays in each nostril daily 12/15/10  Yes Historical Provider, MD  vitamin C (ASCORBIC ACID) 500 MG tablet Take 500 mg by mouth daily.   Yes Historical Provider, MD  Vitamin D, Ergocalciferol, (DRISDOL) 50000 UNITS CAPS Take 50,000 Units by mouth every 7 (seven) days. Taken on Saturdays. 02/26/11  Yes Historical Provider, MD  ondansetron (ZOFRAN ODT) 4 MG disintegrating tablet Take 1 tablet (4 mg total) by mouth every 8 (eight) hours as needed for nausea. 10/27/11   Loren Racer, MD  traMADol (ULTRAM) 50 MG tablet Take 1 tablet (50 mg total) by mouth every 6 (six) hours as needed. 10/27/11   Loren Racer, MD    Current  Facility-Administered Medications  Medication Dose Route Frequency Provider Last Rate Last Dose  . 0.9 %  sodium chloride infusion   Intravenous Continuous Henderson Cloud, MD 75 mL/hr at 10/27/11 2139    . cloNIDine (CATAPRES) tablet 0.1 mg  0.1 mg Oral Once Loren Racer, MD   0.1 mg at 10/27/11 1436  . lidocaine (LIDODERM) 5 % 1 patch  1 patch Transdermal Q24H Henderson Cloud, MD   1 patch at 10/27/11 2153  . metoCLOPramide (REGLAN) injection 10 mg  10 mg Intravenous Once [employer]. Pickering, MD   10 mg at 10/27/11 1725  . morphine 4 MG/ML injection 4 mg  4 mg Intravenous Once Loren Racer, MD   4 mg at 10/27/11 1341  . multivitamin with minerals tablet 1 tablet  1 tablet Oral Daily Henderson Cloud, MD   1 tablet at 10/27/11 2140  . ondansetron (ZOFRAN) injection 4 mg  4 mg Intravenous Once Loren Racer, MD   4 mg at 10/27/11 1341  . ondansetron (ZOFRAN) tablet 4 mg  4 mg Oral Q6H PRN Henderson Cloud, MD       Or  . ondansetron [facility]) injection 4 mg  4 mg Intravenous Q6H PRN Henderson Cloud, MD   4 mg at 10/27/11 2144  . pantoprazole (PROTONIX) injection 40 mg  40 mg Intravenous Q12H Henderson Cloud, MD   40 mg at 10/27/11 2141  . potassium chloride 10 mEq in 100 mL IVPB  10 mEq Intravenous Q1 Hr x 2 Estela Isaiah Blakes, MD 100 mL/hr at 10/27/11 2137 10 mEq at 10/27/11 2137  . senna-docusate (Senokot-S) tablet 1 tablet  1 tablet Oral QHS PRN Henderson Cloud, MD      . sodium chloride 0.9 % bolus 1,000 mL  1,000 mL Intravenous Once Loren Racer, MD 1,000 mL/hr at 10/27/11 1342 1,000 mL at 10/27/11 1342  . sodium chloride 0.9 % bolus 1,000 mL  1,000 mL Intravenous Once Loren Racer, MD 1,000 mL/hr at 10/27/11 1409 1,000 mL at 10/27/11 1409  . sodium chloride 0.9 % bolus 500 mL  500 mL Intravenous Once Harrold Donath R. Pickering, MD 500 mL/hr at 10/27/11 1724 500 mL at 10/27/11 1724  . DISCONTD: 0.9 %  sodium  chloride infusion   Intravenous Continuous Juliet Rude. Rubin Payor, MD 125 mL/hr at 10/27/11 1725      Allergies as of 10/27/2011 - Review Complete 10/27/2011  Allergen Reaction Noted  . Fish-derived products Nausea And Vomiting 08/02/2011  . Cefazolin Rash 09/12/2011  . Iodine Rash 03/08/2011  . Penicillins Rash 03/08/2011    Family History  Problem Relation Age of Onset  . Allergies Mother   . Heart disease Mother   .  Lung cancer Mother     mesothelioma  . Aneurysm Brother     brain  . Heart disease Father   . Lung cancer Father     History   Social History  . Marital Status: Married    Spouse Name: N/A    Number of Children: N/A  . 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 Smoking status: Never Smoker   . Smokeless tobacco: Never Used  . Alcohol Use: Yes     rare use  . Drug Use: No  . Sexually Active: No   Other Topics Concern  . Not on file   Social History Narrative  . No narrative on file    Review of Systems: Gen: Denies any fever, chills, sweats, anorexia, fatigue, weakness, malaise, weight loss, and sleep disorder CV: Denies chest pain, angina, palpitations, syncope, orthopnea, PND, peripheral edema, and claudication. Resp: Denies dyspnea at rest, dyspnea with exercise, cough, sputum, wheezing, coughing up blood, and pleurisy. GI: Denies vomiting blood, jaundice, and fecal incontinence.   Denies dysphagia or odynophagia. GU : Denies urinary burning, blood in urine, urinary frequency, urinary hesitancy, nocturnal urination, and urinary incontinence. MS: Denies joint pain, limitation of movement, and swelling, stiffness, low back pain, extremity pain. Denies muscle weakness, cramps, atrophy.  Derm: Denies rash, itching, dry skin, hives, moles, warts, or unhealing ulcers.  Psych: Denies depression, anxiety, memory loss, suicidal ideation, hallucinations, paranoia, and confusion. Heme: Denies bruising,  bleeding, and enlarged lymph nodes. Neuro:  Denies any headaches, dizziness, paresthesias. Endo:  Denies any problems with DM, thyroid, adrenal function.  Physical Exam: Vital signs in last 24 hours: Temp:  [97.8 F (36.6 C)-98.8 F (37.1 C)] 98.2 F (36.8 C) (10/24 2000) Pulse Rate:  [72-96] 78  (10/24 2000) Resp:  [16-22] 18  (10/24 2000) BP: (141-182)/(88-99) 182/99 mmHg (10/24 2000) SpO2:  [98 %-100 %] 98 % (10/24 2000) Weight:  [252 lb 1.6 oz (114.352 kg)] 252 lb 1.6 oz (114.352 kg) (10/24 2000)   General:   Alert,  Well-developed, well-nourished, pleasant and cooperative in NAD Head:  Normocephalic and atraumatic. Eyes:  Sclera clear, no icterus.   Conjunctiva pink. Ears:  Normal auditory acuity. Nose:  No deformity, discharge,  or lesions. Mouth:  No deformity or lesions.  Oropharynx pink & moist. Neck:  Supple; no masses or thyromegaly. Lungs:  Clear throughout to auscultation.   No wheezes, crackles, or rhonchi. No acute distress. Heart:  Regular rate and rhythm; no murmurs, clicks, rubs,  or gallops. Abdomen:  Soft, nontender and nondistended., mildly obese, No masses, hepatosplenomegaly or hernias noted. Normal bowel sounds, without guarding, and without rebound.   Rectal:  Deferred  Msk:  Symmetrical without gross deformities. Normal posture. Healed scar T12 level Pulses:  Normal pulses noted. Extremities:  Without clubbing or edema. Neurologic:  Alert and  oriented x4;  grossly normal neurologically. Skin:  Intact without significant lesions or rashes. Cervical Nodes:  No significant cervical adenopathy. Psych:  Alert and cooperative. Normal mood and affect.  Intake/Output from previous day:   Intake/Output this shift:    Lab Results:  Basename 10/27/11 1315  WBC 15.6*  HGB 13.1  HCT 38.8*  PLT 359   BMET  Basename 10/27/11 1605 10/27/11 1305  NA 138 134*  K 3.4* 4.2  CL 102 99  CO2 18* 16*  GLUCOSE 94 94  BUN 13 15  CREATININE 1.10 1.11  CALCIUM  9.4  10.1   LFT  Basename 10/27/11 1305  PROT 8.4*  ALBUMIN 3.3*  AST 18  ALT 12  ALKPHOS 139*  BILITOT 0.4  BILIDIR --  IBILI --   PT/INR No results found for this basename: LABPROT:2,INR:2 in the last 72 hours Hepatitis Panel No results found for this basename: HEPBSAG,HCVAB,HEPAIGM,HEPBIGM in the last 72 hours  Studies/Results: Dg Chest 2 View  10/27/2011  *RADIOLOGY REPORT*  Clinical Data: Leukocytosis and weakness.  Question pneumonia.  CHEST - 2 VIEW  Comparison: One-view chest 10/04/2011. Two-view chest 08/02/2011. Two-view chest 09/18/2006.  Findings: Cardiac enlargement is stable.  There is no edema or effusion to suggest failure.  A granuloma in the lingula is stable. Right lateral pleural thickening is stable.  A superior endplate fracture at L1 and inferior endplate fracture of T12 are new since July.  IMPRESSION:  1.  Stable cardiomegaly without failure. 2.  New superior endplate fracture at L1 and inferior endplate fracture at T12.  There is focal kyphosis at this level.   Original Report Authenticated By: Jamesetta Orleans. MATTERN, M.D.    US Abdomen Complete  10/27/2011  *RADIOLOGY REPORT*  Clinical Data:  Abdominal pain.  Vomiting.  History of gallstones.  COMPLETE ABDOMINAL ULTRASOUND  Comparison:  Plain films of 10/04/2011.  CT of 06/30/2011.  No prior ultrasound.  Findings:  Gallbladder:  Gallbladder stones at the 1.3 cm.  No wall thickening or pericholecystic fluid. Sonographic Murphy's sign was not elicited.  Common bile duct: Normal, 2 mm.  Liver: Increased echogenicity.  IVC: Negative  Pancreas:  Poorly visualized due to overlying bowel gas.  Spleen:  Normal in size and echogenicity.  Right Kidney:  12.2 cm.  Right renal upper pole cysts measuring 1.4 and 1.7 cm respectively. No hydronephrosis.  Left Kidney:  14.9 cm. No hydronephrosis.  Dominant cyst measuring 8.9 cm.  Abdominal aorta:  Poorly visualized due to overlying bowel gas.  No aneurysm or ascites identified.   IMPRESSION: No acute process or explanation for vomiting or abdominal pain.  Cholelithiasis without cholecystitis.  Hepatic steatosis.   Original Report Authenticated By: Consuello Bossier, M.D.      Previous Endoscopies: none  Impression / Plan: Intractable episodic N&V dates back to spinal abscess., in the setting of NSAID's . The pattern of vomiting resembles CNS induced vomiting. Doubt GOO or gastroparesis. Vomiting is not likely functional since he has lost 50 lbs and his albumin is 3.3. ??asypmptomatic gall bladder disease possible, NSAID induced ulcer also a possibility. I am concerned about a possibility of ongoing spinal process causing central nausea. I suggest neurology consultation ( DR Jordan Likes is his neurosurgeon). Would try Reglan IV since it has a central antiemetic effect. We will proceed with EGD tomorrow.    LOS: 1 day   Lina Sar  10/28/2011, 12:06 AM

## 2011-10-28 NOTE — Progress Notes (Signed)
Patient presented to room 1519 from ED, got report from Crystal, Patient resting comfortably,

## 2011-10-28 NOTE — Progress Notes (Signed)
[facility] PROGRESS NOTE  John Brandt ZOX:[id number] DOB: 09-04-1950 DOA: 10/27/2011 PCP: Londell Moh, MD  Assessment/Plan:  Persistent nausea and vomiting EGD done today shows signs of severe distal esophagitis, duodenitis and 2 small gastric  ulcer but as per Gi apprers to be secondary to intractable vomiting rather than cause of it.  bx sent for H pylori.  -continue phenergan and reglan q 6hr prn continue PPI and  IV hydration  still has anion gap with metabolic acidosis  from vomiting. Slowly improving.  -US abdomen shows cholelithiasis . Scheduled for  HIDA scan   MSSA paraspinal abscess  completed 6 weeks of abx Was scheduled to have MRI of thoracic and LS spine for follow up . patient still has low back pain. Spoke with Dr Dutch Quint today. recommends getting MRI if he is in the hospital next 1-2 days.    HTN  holding BP meds  Code Status: Full code Family Communication: wife at bedside Disposition Plan: home once stable   Consultants:  Brodie ( GI)  Procedures:  EGD on 10/25  Antibiotics:  none  HPI/Subjective: Has not had nausea since returning from EGD. Admission H&P reviewed.   Objective: Filed Vitals:   10/28/11 1120 10/28/11 1130 10/28/11 1145 10/28/11 1208  BP: 160/110 164/104 155/91 154/94  Pulse:    88  Temp: 97.7 F (36.5 C)   98.3 F (36.8 C)  TempSrc: Temporal   Oral  Resp: 27 23 22 21   Height:      Weight:      SpO2: 94% 98% 94% 98%   No intake or output data in the 24 hours ending 10/28/11 1407 Filed Weights   10/27/11 2000  Weight: 114.352 kg (252 lb 1.6 oz)    Exam:   General:  Elderly male in NAD  HEENT: no pallor, dry oral mucosa  Cardiovascular: NS1&S2, no murmurs  Respiratory: clear b/l, no added sounds  Abdomen:  Soft, NT ND, bs+  EXT: warm, no edema  CNS: AAOX 3   Data Reviewed: Basic Metabolic Panel:  Lab 10/27/11 8119 10/27/11 1605 10/27/11 1305  NA -- 138 134*  K -- 3.4* 4.2  CL  -- 102 99  CO2 -- 18* 16*  GLUCOSE -- 94 94  BUN -- 13 15  CREATININE -- 1.10 1.11  CALCIUM -- 9.4 10.1  MG 1.8 -- --  PHOS -- -- --   Liver Function Tests:  Lab 10/27/11 1305  AST 18  ALT 12  ALKPHOS 139*  BILITOT 0.4  PROT 8.4*  ALBUMIN 3.3*    Lab 10/27/11 1305  LIPASE 30  AMYLASE --   No results found for this basename: AMMONIA:5 in the last 168 hours CBC:  Lab 10/27/11 1315  WBC 15.6*  NEUTROABS 13.5*  HGB 13.1  HCT 38.8*  MCV 79.2  PLT 359   Cardiac Enzymes: No results found for this basename: CKTOTAL:5,CKMB:5,CKMBINDEX:5,TROPONINI:5 in the last 168 hours BNP (last 3 results) No results found for this basename: PROBNP:3 in the last 8760 hours CBG:  Lab 10/28/11 0746  GLUCAP 97    Recent Results (from the past 240 hour(s))  CULTURE, BLOOD (ROUTINE X 2)     Status: Normal (Preliminary result)   Collection Time   10/27/11  3:30 PM      Component Value Range Status Comment   Specimen Description BLOOD RIGHT HAND   Final    Special Requests BOTTLES DRAWN AEROBIC AND ANAEROBIC 5CC   Final    Culture  Setup Time 10/27/2011 22:26   Final    Culture     Final    Value:        BLOOD CULTURE RECEIVED NO GROWTH TO DATE CULTURE WILL BE HELD FOR 5 DAYS BEFORE ISSUING A FINAL NEGATIVE REPORT   Report Status PENDING   Incomplete   CULTURE, BLOOD (ROUTINE X 2)     Status: Normal (Preliminary result)   Collection Time   10/27/11  4:05 PM      Component Value Range Status Comment   Specimen Description BLOOD RIGHT ARM   Final    Special Requests BOTTLES DRAWN AEROBIC AND ANAEROBIC 5CC   Final    Culture  Setup Time 10/27/2011 22:26   Final    Culture     Final    Value:        BLOOD CULTURE RECEIVED NO GROWTH TO DATE CULTURE WILL BE HELD FOR 5 DAYS BEFORE ISSUING A FINAL NEGATIVE REPORT   Report Status PENDING   Incomplete      Studies: Dg Chest 2 View  10/27/2011  *RADIOLOGY REPORT*  Clinical Data: Leukocytosis and weakness.  Question pneumonia.  CHEST - 2  VIEW  Comparison: One-view chest 10/04/2011. Two-view chest 08/02/2011. Two-view chest 09/18/2006.  Findings: Cardiac enlargement is stable.  There is no edema or effusion to suggest failure.  A granuloma in the lingula is stable. Right lateral pleural thickening is stable.  A superior endplate fracture at L1 and inferior endplate fracture of T12 are new since July.  IMPRESSION:  1.  Stable cardiomegaly without failure. 2.  New superior endplate fracture at L1 and inferior endplate fracture at T12.  There is focal kyphosis at this level.   Original Report Authenticated By: Jamesetta Orleans. MATTERN, M.D.    US Abdomen Complete  10/27/2011  *RADIOLOGY REPORT*  Clinical Data:  Abdominal pain.  Vomiting.  History of gallstones.  COMPLETE ABDOMINAL ULTRASOUND  Comparison:  Plain films of 10/04/2011.  CT of 06/30/2011.  No prior ultrasound.  Findings:  Gallbladder:  Gallbladder stones at the 1.3 cm.  No wall thickening or pericholecystic fluid. Sonographic Murphy's sign was not elicited.  Common bile duct: Normal, 2 mm.  Liver: Increased echogenicity.  IVC: Negative  Pancreas:  Poorly visualized due to overlying bowel gas.  Spleen:  Normal in size and echogenicity.  Right Kidney:  12.2 cm.  Right renal upper pole cysts measuring 1.4 and 1.7 cm respectively. No hydronephrosis.  Left Kidney:  14.9 cm. No hydronephrosis.  Dominant cyst measuring 8.9 cm.  Abdominal aorta:  Poorly visualized due to overlying bowel gas.  No aneurysm or ascites identified.  IMPRESSION: No acute process or explanation for vomiting or abdominal pain.  Cholelithiasis without cholecystitis.  Hepatic steatosis.   Original Report Authenticated By: Consuello Bossier, M.D.     Scheduled Meds:   . cloNIDine  0.1 mg Oral Once  . fentaNYL      . fentaNYL  12.5 mcg Intravenous Once  . lidocaine  1 patch Transdermal Q24H  . metoCLOPramide (REGLAN) injection  10 mg Intravenous Once  . metoCLOPramide (REGLAN) injection  10 mg Intravenous Q6H  .  multivitamin with minerals  1 tablet Oral Daily  . ondansetron  4 mg Intravenous NOW  . pantoprazole (PROTONIX) IV  40 mg Intravenous Q12H  . potassium chloride  10 mEq Intravenous Q1 Hr x 2  . sodium chloride  1,000 mL Intravenous Once  . sodium chloride  1,000 mL Intravenous Once  .  sodium chloride  500 mL Intravenous Once  . sucralfate  1 g Oral Q6H   Continuous Infusions:   . sodium chloride 75 mL/hr at 10/27/11 2139  . DISCONTD: sodium chloride 125 mL/hr at 10/27/11 1725  . DISCONTD: sodium chloride 20 mL/hr at 10/28/11 0916     Time spent: 25 minutes    Infinity Jeffords  [facility] Pager [phone]. If 8PM-8AM, please contact night-coverage at [email or web address], password [facility] 10/28/2011, 2:07 PM  LOS: 1 day

## 2011-10-28 NOTE — Op Note (Signed)
[facility] [address] [place], [address]   ENDOSCOPY PROCEDURE REPORT  PATIENT: John Brandt, John Brandt.  MR#: [id number] BIRTHDATE: 1950/01/08 , 60  yrs. old GENDER: Male ENDOSCOPIST: Hart Carwin, MD REFERRED BY:  Romero Liner, M.D. PROCEDURE DATE:  10/28/2011 PROCEDURE:  EGD w/ biopsy ASA CLASS:     Class II INDICATIONS:  vomiting.   nausea.   several month hx of N&V in no relation to food, better when inactive, pt had MSSA T12 vertebral level, treated for 6 weeks with IV antibiotics. MEDICATIONS: These medications were titrated to patient response per physician's verbal order, Versed 10 mg IV, and Fentanyl 100 mcg IV  TOPICAL ANESTHETIC: Cetacaine Spray  DESCRIPTION OF PROCEDURE: After the risks benefits and alternatives of the procedure were thoroughly explained, informed consent was obtained.  The Pentax Gastroscope [id number] endoscope was introduced through the mouth and advanced to the second portion of the duodenum. Without limitations.  The instrument was slowly withdrawn as the mucosa was fully examined.      ESOPHAGUS: A 3 cm hiatal hernia was noted.   A medium sized hiatal hernia was noted.   Two linear ulcers with active oozing of blood were found at the gastroesophageal junction.  STOMACH: Mild gastritis (inflammation) was found on the anterior wall of the gastric antrum.  A biopsy was performed using cold forceps.  Sample obtained for helicobacter pylori testing.  A biopsy was performed using cold forceps. mild fibrous nonobstructing esophageal stricture, severe distal esophagitis Grade 3, there bleeding from the stricture precipitated by passage of the scope and gagging Retroflexed views revealed no abnormalities.     The scope was then withdrawn from the patient and the procedure completed.  DUODENUM: 3-5 mm shallow duodenal bulb ulcer surrounded by moderately severe duodenitis, erosions and edema, extending into second portion  duodenum- biopsies taken  COMPLICATIONS: There were no complications. ENDOSCOPIC IMPRESSION: 1.   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   Medium sized hiatal hernia , 3 cm in length 3.   Two ulcers were found at the gastroesophageal junction - biopsies to r/o Barrett's esophagus 4.   Gastritis (inflammation) was found on the anterior wall of the gastric antrum; biopsy 5.   Moderately severe duodenitis and small duodenal ulcer , appears benign, biopsies taken  The above abnormalities may be a results of persistent vomiting rather than a cause of the vomitind. I still think we ought to investigate possibility of central nausea associated with his spinal/paraspinal infection  RECOMMENDATIONS: Await pathology results PPUI bid reglan 10 mg tid ac HIDA scan to r/o nonfunctioning gall bladder  REPEAT EXAM: no recall necessary  eSigned:  Hart Carwin, MD 10/28/2011 11:27 AM   CC:  PATIENT NAME:  [name]. MR#: [id number]

## 2011-10-28 NOTE — ED Provider Notes (Signed)
  Physical Exam  BP 182/99  Pulse 78  Temp 98.2 F (36.8 C) (Oral)  Resp 18  Ht 6\' 1"  (1.854 m)  Wt 252 lb 1.6 oz (114.352 kg)  BMI 33.26 kg/m2  SpO2 98%  Physical Exam  ED Course  Procedures  MDM Patient has had continuous vomiting. His bicarbonate is increased to 18, however he still feels miserable and has not tolerated much orally. He'll be admitted to medicine for further treatment.      Juliet Rude. Rubin Payor, MD 10/28/11 Moses Manners

## 2011-10-28 NOTE — Interval H&P Note (Signed)
History and Physical Interval Note:  10/28/2011 10:51 AM  John Brandt  has presented today for surgery, with the diagnosis of nausea and vomiting, abd. pain  The various methods of treatment have been discussed with the patient and family. After consideration of risks, benefits and other options for treatment, the patient has consented to  Procedure(s) (LRB) with comments: ESOPHAGOGASTRODUODENOSCOPY (EGD) (N/A) as a surgical intervention .  The patient's history has been reviewed, patient examined, no change in status, stable for surgery.  I have reviewed the patient's chart and labs.  Questions were answered to the patient's satisfaction.     Lina Sar

## 2011-10-29 DIAGNOSIS — K269 Duodenal ulcer, unspecified as acute or chronic, without hemorrhage or perforation: Secondary | ICD-10-CM

## 2011-10-29 DIAGNOSIS — R112 Nausea with vomiting, unspecified: Secondary | ICD-10-CM

## 2011-10-29 DIAGNOSIS — K812 Acute cholecystitis with chronic cholecystitis: Secondary | ICD-10-CM | POA: Diagnosis present

## 2011-10-29 LAB — HEPATIC FUNCTION PANEL
AST: 16 U/L (ref 0–37)
Albumin: 3 g/dL — ABNORMAL LOW (ref 3.5–5.2)
Total Protein: 7.7 g/dL (ref 6.0–8.3)

## 2011-10-29 LAB — BASIC METABOLIC PANEL
CO2: 21 mEq/L (ref 19–32)
Glucose, Bld: 121 mg/dL — ABNORMAL HIGH (ref 70–99)
Potassium: 3 mEq/L — ABNORMAL LOW (ref 3.5–5.1)
Sodium: 136 mEq/L (ref 135–145)

## 2011-10-29 LAB — CBC
Hemoglobin: 12.5 g/dL — ABNORMAL LOW (ref 13.0–17.0)
RBC: 4.86 MIL/uL (ref 4.22–5.81)

## 2011-10-29 MED ORDER — DEXTROSE-NACL 5-0.45 % IV SOLN
INTRAVENOUS | Status: DC
  Administered 2011-10-29 – 2011-10-30 (×2): via INTRAVENOUS

## 2011-10-29 MED ORDER — HYDRALAZINE HCL 20 MG/ML IJ SOLN
10.0000 mg | Freq: Four times a day (QID) | INTRAMUSCULAR | Status: DC | PRN
  Administered 2011-10-29 – 2011-10-30 (×3): 10 mg via INTRAVENOUS
  Filled 2011-10-29 (×3): qty 1

## 2011-10-29 MED ORDER — POTASSIUM CHLORIDE CRYS ER 20 MEQ PO TBCR
40.0000 meq | EXTENDED_RELEASE_TABLET | Freq: Once | ORAL | Status: AC
  Administered 2011-10-29: 40 meq via ORAL
  Filled 2011-10-29: qty 2

## 2011-10-29 MED ORDER — POTASSIUM CHLORIDE 10 MEQ/100ML IV SOLN
10.0000 meq | INTRAVENOUS | Status: AC
Start: 2011-10-29 — End: 2011-10-29
  Administered 2011-10-29 (×4): 10 meq via INTRAVENOUS
  Filled 2011-10-29 (×4): qty 100

## 2011-10-29 NOTE — Progress Notes (Signed)
Subjective: Cross cover [facility] I have a lengthy discussion with the patient and his wife who are at bedside, about his ongoing problems with intractable nausea and vomiting. On reviewing his records, he has cholelithiasis with delayed emptying on a HIDA done on 10/05/11, indicating chronic cholecystitis and biliary dyskinesia on a HIDA done yesterday with a 20% ejection fraction. He has lost over 50 lbs since May, this year and is not able to keep anything down. He denies having abdominal pain but does have right sided back pain. He is not sure if his symptoms are worse post-prandially as he has not been able to eat a regular meal in 6 months. He had an EGD done yesterday that revealed esophagitis, 2 small gastric ulcers and duodenitis. He has been on NSAIDS for back pain.    Objective: Vital signs in last 24 hours: Temp:  [97.7 F (36.5 C)-99 F (37.2 C)] 99 F (37.2 C) (10/26 0600) Pulse Rate:  [62-114] 62  (10/26 0600) Resp:  [14-36] 20  (10/26 0600) BP: (149-185)/(84-144) 178/100 mmHg (10/26 0651) SpO2:  [94 %-100 %] 95 % (10/26 0600) Last BM Date: 10/26/11  Intake/Output from previous day: 10/25 0701 - 10/26 0700 In: 2505.8 [I.V.:2493.8; IV Piggyback:12] Out: -  Intake/Output this shift:   General appearance: alert, cooperative, appears stated age and moderately obese Resp: clear to auscultation bilaterally Cardio: regular rate and rhythm, S1, S2 normal, no murmur, click, rub or gallop GI: soft, morbidly obese, non-tender; bowel sounds normal; no masses,  no organomegaly Extremities: extremities normal, atraumatic, no cyanosis or edema  Lab Results:  Basename 10/29/11 0552 10/27/11 1315  WBC 16.7* 15.6*  HGB 12.5* 13.1  HCT 38.7* 38.8*  PLT 332 359   BMET  Basename 10/29/11 0552 10/27/11 1605 10/27/11 1305  NA 136 138 134*  K 3.0* 3.4* 4.2  CL 101 102 99  CO2 21 18* 16*  GLUCOSE 121* 94 94  BUN 8 13 15   CREATININE 0.84 1.10 1.11  CALCIUM 9.2 9.4 10.1    LFT  Basename 10/27/11 1305  PROT 8.4*  ALBUMIN 3.3*  AST 18  ALT 12  ALKPHOS 139*  BILITOT 0.4  BILIDIR --  IBILI --   Studies/Results: Dg Chest 2 View  10/27/2011  *RADIOLOGY REPORT*  Clinical Data: Leukocytosis and weakness.  Question pneumonia.  CHEST - 2 VIEW  Comparison: One-view chest 10/04/2011. Two-view chest 08/02/2011. Two-view chest 09/18/2006.  Findings: Cardiac enlargement is stable.  There is no edema or effusion to suggest failure.  A granuloma in the lingula is stable. Right lateral pleural thickening is stable.  A superior endplate fracture at L1 and inferior endplate fracture of T12 are new since July.  IMPRESSION:  1.  Stable cardiomegaly without failure. 2.  New superior endplate fracture at L1 and inferior endplate fracture at T12.  There is focal kyphosis at this level.   Original Report Authenticated By: Jamesetta Orleans. MATTERN, M.D.    US Abdomen Complete  10/27/2011  *RADIOLOGY REPORT*  Clinical Data:  Abdominal pain.  Vomiting.  History of gallstones.  COMPLETE ABDOMINAL ULTRASOUND  Comparison:  Plain films of 10/04/2011.  CT of 06/30/2011.  No prior ultrasound.  Findings:  Gallbladder:  Gallbladder stones at the 1.3 cm.  No wall thickening or pericholecystic fluid. Sonographic Murphy's sign was not elicited.  Common bile duct: Normal, 2 mm.  Liver: Increased echogenicity.  IVC: Negative  Pancreas:  Poorly visualized due to overlying bowel gas.  Spleen:  Normal in size and echogenicity.  Right Kidney:  12.2 cm.  Right renal upper pole cysts measuring 1.4 and 1.7 cm respectively. No hydronephrosis.  Left Kidney:  14.9 cm. No hydronephrosis.  Dominant cyst measuring 8.9 cm.  Abdominal aorta:  Poorly visualized due to overlying bowel gas.  No aneurysm or ascites identified.  IMPRESSION: No acute process or explanation for vomiting or abdominal pain.  Cholelithiasis without cholecystitis.  Hepatic steatosis.   Original Report Authenticated By: Consuello Bossier, M.D.    Nm  Hepato W/eject Fract  10/28/2011  *RADIOLOGY REPORT*  Clinical Data:  Abdominal pain and nausea vomiting. Cholelithiasis.  NUCLEAR MEDICINE HEPATOBILIARY IMAGING WITH GALLBLADDER EF  Technique:  Sequential images of the abdomen were obtained out to 60 minutes following intravenous administration of radiopharmaceutical. After oral ingestion of Ensure, gallbladder ejection fraction was determined.  Radiopharmaceutical:  5.5 mCi Tc-31m Choletec  Comparison: None.  Findings: Prompt radiopharmaceutical uptake by the liver is seen. The liver is normal in appearance.  Prompt biliary excretion activity is demonstrated.  Gallbladder activity is seen initially on the 15-minute image.  Biliary activity reaches small bowel by 30 minutes.  Gallbladder ejection fraction:  20%. Normal gallbladder ejection fraction with Ensure is greater than 33%.  The patient did not experience symptoms after oral ingestion of Ensure.  IMPRESSION:  1.  No evidence of cystic duct or biliary obstruction. 2.  Decreased gallbladder ejection fraction of 20%.   Original Report Authenticated By: Danae Orleans, M.D.    Medications: I have reviewed the patient's current medications.  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 Carolynne Edouard at [facility], who has kindly agreed to the the patient.   2) Hepatic steatosis-may benefit from a liver biopsy with the cholecystectomy.  3) Esophagitis/GU/Duodenitis-on PPI.  4) Paraspinal abscess-MSSA. 5) Morbid obesity  LOS: 2 days   Assad Harbeson 10/29/2011, 10:14 AM

## 2011-10-29 NOTE — Consult Note (Signed)
The pt has no abd pain associated with his nausea. His picture is not clear but he does have gallstones. He also has severe esophagitis and gastritis with ulcerations. I think at this point it would be reasonable to repeat his MRI of his back and have this evaluated to make sure this is not still an ongoing problem. The stones themselves would be a reasonable indication to remove his gallbladder and I suspect this will happen during this hospitalization. We will follow along

## 2011-10-29 NOTE — Progress Notes (Signed)
Claiborne Billings, NP was notified when pt BP measured 178/100.  MD to put orders in.

## 2011-10-29 NOTE — Consult Note (Signed)
Reason for Consult: Abnormal HIDA scan, Hx of intractable nausea and vomiting. Referring Physician: Kaimen Peine is an 61 y.o. male.  [id number] 61 y/o man who presents with a 2 day history of intractable nausea and vomiting. He has vomited at least 15 times. Emesis is brown/black colored. When asked he does admit to having some dark stools, but has not noticed any frank blood. He has been taking naproxen twice a day pretty consistently. He has had multiple admissions for these issues and always has significant electrolyte abnormalities and ARF that corroborate his history of severe n/v. Usual Rx per patient has been PPI's zofran and or phenergan,discontinuance of NSAID's, which will help temporarily allowing him to be discharged from hospital only to have symptoms reoccur. Patient states that he has been having these symptoms almost constantly since last May.   His PMH is significant for an MSSA Epidural abscess (T12 vertebral level) for which he completed 6 weeks of IV antibiotics under the care of Dr. Daiva Eves. Has had a 50 pound weight loss since July (unintentional). In the ED he is found to be hypokalemic with metabolic acidosis and we are asked to admit him for further evaluation and management. Upon questioning he admits to having epigastric pain, a sour taste in his mouth and frequent belching.  At present patient states that he has not been able to keep "anything down" and that when he does try he almost immediately vomits it back up. Currently denies any abdominal pain, but does relate having right sided back pain described as a "band of dull, occasionally sharp pain running from mid lower back around to his lateral side". He is not clear on whether or not this pain is associated with food. Denies frequent heartburn symptoms, takes only HTN meds, statins, and multivitamins.  [place] of abdomen done 10/27/11:  Gallstones at the 1.3 cm.  No evidence of gall bladder wall thickening or  pericholecystic fluid; Negative for sonographic Murphy's sign. CMD normal 2mm. Impression: No acute process or explanation for vomiting or abdominal pain.  Cholelithiasis w/o cholecystitis. Hepatic steatosis.  EGD done on 10/28/11: Impression: 1. Severe distal esophagitis with an non obstructing fibrous stricture. 2. Medium sized Hiatal hernia, 3 cm in length. 3. Two ulcers found at the GE junction; biopsies taken to r/o Barrett's esophagus. 4. Gastritis found on the anterior wall of the Gastric Antrum; Biopsy sent. 5. Moderately severe duodenitis and small duodenal ulcer; appears benign.  Biopsies sent.  Summary; " The above abnormalities may be a result of persistent vomiting rather than a cause of the vomiting. I still think we ought to investigate possibility of central nausea associated with his spinal/paraspinal infection." Lina Sar M.D. 10/28/11 11:27 am  HIDA scan done 10/28/11: 1. No evidence of cystic duct or biliary obstruction. 2. Decreased gallbladder ejection fraction of 20%  We are asked to see the patient on consult to evaluate if his gallbladder is the cause of this above c/o.  Labs today:   AST 16 (18 on 10/24) ALT   9  (12 on 10/24) Lipase 30 (no repeat) Alk Phos 115 (139 on 10/24) Total Bili 0.3 (0.4 on 10/24) WBC 16.7 (slightly increased from 15.6 on 10/24) Hypokalemic (3.0 today)   On physical exam patients only c/o is one of lower back pain, and nausea. No focal area of abdominal pain. Negative Murphy's on exam, +BS, no distention or tenderness of the abdomen. Positive for "chills as he gets nauseated", no emesis recorded.  Afebrile hypertensive (BP meds being held).  Past Medical History  Diagnosis Date  . Hypertension   . Hyperlipidemia   . Allergic rhinitis   . Hx of cardiac cath   . Sleep apnea     sleep study 07-02-02  . Chronic back pain   . 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 Diskitis Aug 2013    t12 with  osteomyelitis  . Complication of anesthesia   . PONV (postoperative nausea and vomiting)     Past Surgical History  Procedure Date  . Vasectomy   . Dental implants   . Cardiac catheterization     more than 15 yrs ago  . Back surgery   . Open biopsy on back 08/08/2011    Family History  Problem Relation Age of Onset  . Allergies Mother   . Heart disease Mother   . Lung cancer Mother     mesothelioma  . Aneurysm Brother     brain  . Heart disease Father   . Lung cancer Father     Social History:  reports that he has never smoked. He has never used smokeless tobacco. He reports that he drinks alcohol. He reports that he does not use illicit drugs.  Allergies:  Allergies  Allergen Reactions  . Fish-Derived Products Nausea And Vomiting  . Cefazolin Rash    rash  . Iodine Rash  . Penicillins Rash    Medications: I have reviewed the patient's current medications.  Results for orders placed during the hospital encounter of 10/27/11 (from the past 48 hour(s))  COMPREHENSIVE METABOLIC PANEL     Status: Abnormal   Collection Time   10/27/11  1:05 PM      Component Value Range Comment   Sodium 134 (*) 135 - 145 mEq/L    Potassium 4.2  3.5 - 5.1 mEq/L    Chloride 99  96 - 112 mEq/L    CO2 16 (*) 19 - 32 mEq/L    Glucose, Bld 94  70 - 99 mg/dL    BUN 15  6 - 23 mg/dL    Creatinine, Ser 1.61  0.50 - 1.35 mg/dL    Calcium 09.6  8.4 - 10.5 mg/dL    Total Protein 8.4 (*) 6.0 - 8.3 g/dL    Albumin 3.3 (*) 3.5 - 5.2 g/dL    AST 18  0 - 37 U/L    ALT 12  0 - 53 U/L    Alkaline Phosphatase 139 (*) 39 - 117 U/L    Total Bilirubin 0.4  0.3 - 1.2 mg/dL    GFR calc non Af Amer 69 (*) >90 mL/min    GFR calc Af Amer 80 (*) >90 mL/min   LIPASE, BLOOD     Status: Normal   Collection Time   10/27/11  1:05 PM      Component Value Range Comment   Lipase 30  11 - 59 U/L   CBC WITH DIFFERENTIAL     Status: Abnormal   Collection Time   10/27/11  1:15 PM      Component Value Range  Comment   WBC 15.6 (*) 4.0 - 10.5 K/uL    RBC 4.90  4.22 - 5.81 MIL/uL    Hemoglobin 13.1  13.0 - 17.0 g/dL    HCT 04.5 (*) 40.9 - 52.0 %    MCV 79.2  78.0 - 100.0 fL    MCH 26.7  26.0 -  34.0 pg    MCHC 33.8  30.0 - 36.0 g/dL    RDW 16.1 (*) 09.6 - 15.5 %    Platelets 359  150 - 400 K/uL    Neutrophils Relative 86 (*) 43 - 77 %    Neutro Abs 13.5 (*) 1.7 - 7.7 K/uL    Lymphocytes Relative 8 (*) 12 - 46 %    Lymphs Abs 1.3  0.7 - 4.0 K/uL    Monocytes Relative 5  3 - 12 %    Monocytes Absolute 0.8  0.1 - 1.0 K/uL    Eosinophils Relative 0  0 - 5 %    Eosinophils Absolute 0.1  0.0 - 0.7 K/uL    Basophils Relative 0  0 - 1 %    Basophils Absolute 0.0  0.0 - 0.1 K/uL   URINALYSIS, ROUTINE W REFLEX MICROSCOPIC     Status: Abnormal   Collection Time   10/27/11  1:40 PM      Component Value Range Comment   Color, Urine YELLOW  YELLOW    APPearance CLEAR  CLEAR    Specific Gravity, Urine 1.020  1.005 - 1.030    pH 8.0  5.0 - 8.0    Glucose, UA NEGATIVE  NEGATIVE mg/dL    Hgb urine dipstick NEGATIVE  NEGATIVE    Bilirubin Urine NEGATIVE  NEGATIVE    Ketones, ur 40 (*) NEGATIVE mg/dL    Protein, ur 30 (*) NEGATIVE mg/dL    Urobilinogen, UA 0.2  0.0 - 1.0 mg/dL    Nitrite NEGATIVE  NEGATIVE    Leukocytes, UA TRACE (*) NEGATIVE   URINE MICROSCOPIC-ADD ON     Status: Normal   Collection Time   10/27/11  1:40 PM      Component Value Range Comment   Squamous Epithelial / LPF RARE  RARE    WBC, UA 3-6  <3 WBC/hpf    Urine-Other MUCOUS PRESENT     CULTURE, BLOOD (ROUTINE X 2)     Status: Normal (Preliminary result)   Collection Time   10/27/11  3:30 PM      Component Value Range Comment   Specimen Description BLOOD RIGHT HAND      Special Requests BOTTLES DRAWN AEROBIC AND ANAEROBIC 5CC      Culture  Setup Time 10/27/2011 22:26      Culture        Value:        BLOOD CULTURE RECEIVED NO GROWTH TO DATE CULTURE WILL BE HELD FOR 5 DAYS BEFORE ISSUING A FINAL NEGATIVE REPORT   Report  Status PENDING     CULTURE, BLOOD (ROUTINE X 2)     Status: Normal (Preliminary result)   Collection Time   10/27/11  4:05 PM      Component Value Range Comment   Specimen Description BLOOD RIGHT ARM      Special Requests BOTTLES DRAWN AEROBIC AND ANAEROBIC 5CC      Culture  Setup Time 10/27/2011 22:26      Culture        Value:        BLOOD CULTURE RECEIVED NO GROWTH TO DATE CULTURE WILL BE HELD FOR 5 DAYS BEFORE ISSUING A FINAL NEGATIVE REPORT   Report Status PENDING     BASIC METABOLIC PANEL     Status: Abnormal   Collection Time   10/27/11  4:05 PM      Component Value Range Comment   Sodium 138  135 - 145 mEq/L  Potassium 3.4 (*) 3.5 - 5.1 mEq/L    Chloride 102  96 - 112 mEq/L    CO2 18 (*) 19 - 32 mEq/L    Glucose, Bld 94  70 - 99 mg/dL    BUN 13  6 - 23 mg/dL    Creatinine, Ser 6.57  0.50 - 1.35 mg/dL    Calcium 9.4  8.4 - 84.6 mg/dL    GFR calc non Af Amer 70 (*) >90 mL/min    GFR calc Af Amer 81 (*) >90 mL/min   MAGNESIUM     Status: Normal   Collection Time   10/27/11  7:10 PM      Component Value Range Comment   Magnesium 1.8  1.5 - 2.5 mg/dL   GLUCOSE, CAPILLARY     Status: Normal   Collection Time   10/28/11  7:46 AM      Component Value Range Comment   Glucose-Capillary 97  70 - 99 mg/dL    Comment 1 Notify RN     CBC     Status: Abnormal   Collection Time   10/29/11  5:52 AM      Component Value Range Comment   WBC 16.7 (*) 4.0 - 10.5 K/uL    RBC 4.86  4.22 - 5.81 MIL/uL    Hemoglobin 12.5 (*) 13.0 - 17.0 g/dL    HCT 96.2 (*) 95.2 - 52.0 %    MCV 79.6  78.0 - 100.0 fL    MCH 25.7 (*) 26.0 - 34.0 pg    MCHC 32.3  30.0 - 36.0 g/dL    RDW 84.1 (*) 32.4 - 15.5 %    Platelets 332  150 - 400 K/uL   BASIC METABOLIC PANEL     Status: Abnormal   Collection Time   10/29/11  5:52 AM      Component Value Range Comment   Sodium 136  135 - 145 mEq/L    Potassium 3.0 (*) 3.5 - 5.1 mEq/L    Chloride 101  96 - 112 mEq/L    CO2 21  19 - 32 mEq/L    Glucose, Bld  121 (*) 70 - 99 mg/dL    BUN 8  6 - 23 mg/dL    Creatinine, Ser 4.01  0.50 - 1.35 mg/dL    Calcium 9.2  8.4 - 02.7 mg/dL    GFR calc non Af Amer >90  >90 mL/min    GFR calc Af Amer >90  >90 mL/min   HEPATIC FUNCTION PANEL     Status: Abnormal   Collection Time   10/29/11  5:52 AM      Component Value Range Comment   Total Protein 7.7  6.0 - 8.3 g/dL    Albumin 3.0 (*) 3.5 - 5.2 g/dL    AST 16  0 - 37 U/L    ALT 9  0 - 53 U/L    Alkaline Phosphatase 115  39 - 117 U/L    Total Bilirubin 0.3  0.3 - 1.2 mg/dL    Bilirubin, Direct 0.1  0.0 - 0.3 mg/dL    Indirect Bilirubin 0.2 (*) 0.3 - 0.9 mg/dL     Dg Chest 2 View  25/36/6440  *RADIOLOGY REPORT*  Clinical Data: Leukocytosis and weakness.  Question pneumonia.  CHEST - 2 VIEW  Comparison: One-view chest 10/04/2011. Two-view chest 08/02/2011. Two-view chest 09/18/2006.  Findings: Cardiac enlargement is stable.  There is no edema or effusion to suggest failure.  A granuloma in  the lingula is stable. Right lateral pleural thickening is stable.  A superior endplate fracture at L1 and inferior endplate fracture of T12 are new since July.  IMPRESSION:  1.  Stable cardiomegaly without failure. 2.  New superior endplate fracture at L1 and inferior endplate fracture at T12.  There is focal kyphosis at this level.   Original Report Authenticated By: Jamesetta Orleans. MATTERN, M.D.    US Abdomen Complete  10/27/2011  *RADIOLOGY REPORT*  Clinical Data:  Abdominal pain.  Vomiting.  History of gallstones.  COMPLETE ABDOMINAL ULTRASOUND  Comparison:  Plain films of 10/04/2011.  CT of 06/30/2011.  No prior ultrasound.  Findings:  Gallbladder:  Gallbladder stones at the 1.3 cm.  No wall thickening or pericholecystic fluid. Sonographic Murphy's sign was not elicited.  Common bile duct: Normal, 2 mm.  Liver: Increased echogenicity.  IVC: Negative  Pancreas:  Poorly visualized due to overlying bowel gas.  Spleen:  Normal in size and echogenicity.  Right Kidney:  12.2  cm.  Right renal upper pole cysts measuring 1.4 and 1.7 cm respectively. No hydronephrosis.  Left Kidney:  14.9 cm. No hydronephrosis.  Dominant cyst measuring 8.9 cm.  Abdominal aorta:  Poorly visualized due to overlying bowel gas.  No aneurysm or ascites identified.  IMPRESSION: No acute process or explanation for vomiting or abdominal pain.  Cholelithiasis without cholecystitis.  Hepatic steatosis.   Original Report Authenticated By: Consuello Bossier, M.D.    Nm Hepato W/eject Fract  10/28/2011  *RADIOLOGY REPORT*  Clinical Data:  Abdominal pain and nausea vomiting. Cholelithiasis.  NUCLEAR MEDICINE HEPATOBILIARY IMAGING WITH GALLBLADDER EF  Technique:  Sequential images of the abdomen were obtained out to 60 minutes following intravenous administration of radiopharmaceutical. After oral ingestion of Ensure, gallbladder ejection fraction was determined.  Radiopharmaceutical:  5.5 mCi Tc-62m Choletec  Comparison: None.  Findings: Prompt radiopharmaceutical uptake by the liver is seen. The liver is normal in appearance.  Prompt biliary excretion activity is demonstrated.  Gallbladder activity is seen initially on the 15-minute image.  Biliary activity reaches small bowel by 30 minutes.  Gallbladder ejection fraction:  20%. Normal gallbladder ejection fraction with Ensure is greater than 33%.  The patient did not experience symptoms after oral ingestion of Ensure.  IMPRESSION:  1.  No evidence of cystic duct or biliary obstruction. 2.  Decreased gallbladder ejection fraction of 20%.   Original Report Authenticated By: Danae Orleans, M.D.     Review of Systems  Constitutional: Positive for chills, weight loss and malaise/fatigue. Negative for fever and diaphoresis.  HENT: Positive for hearing loss. Negative for ear pain, nosebleeds, congestion, sore throat, neck pain, tinnitus and ear discharge.   Eyes: Negative.   Respiratory: Negative.  Negative for stridor.   Cardiovascular: Negative.     Gastrointestinal: Positive for heartburn, nausea, vomiting and melena. Negative for diarrhea, constipation and blood in stool.  Genitourinary: Negative.   Musculoskeletal: Positive for back pain. Negative for myalgias, joint pain and falls.  Skin: Negative.   Neurological: Negative.  Negative for weakness and headaches.  Endo/Heme/Allergies: Negative.   Psychiatric/Behavioral: Negative.    Blood pressure 177/99, pulse 84, temperature 98.4 F (36.9 C), temperature source Oral, resp. rate 20, height 6\' 1"  (1.854 m), weight 252 lb 1.6 oz (114.352 kg), SpO2 95.00%. Physical Exam  Constitutional: He is oriented to person, place, and time. He appears well-developed and well-nourished. No distress.  HENT:  Head: Normocephalic and atraumatic.  Nose: Nose normal.  Mouth/Throat: No oropharyngeal exudate.  Eyes: Conjunctivae normal and EOM are normal. Pupils are equal, round, and reactive to light. Right eye exhibits no discharge. Left eye exhibits no discharge. No scleral icterus.  Neck: Normal range of motion. Neck supple. No JVD present. No tracheal deviation present. No thyromegaly present.  Cardiovascular: Normal rate, regular rhythm, normal heart sounds and intact distal pulses.  Exam reveals no gallop and no friction rub.   No murmur heard. Respiratory: Effort normal and breath sounds normal. No stridor. No respiratory distress. He has no wheezes. He has no rales. He exhibits no tenderness.  GI: Soft. Bowel sounds are normal. He exhibits no distension and no mass. There is no tenderness. There is no rebound and no guarding.  Musculoskeletal: Normal range of motion. He exhibits no edema and no tenderness.  Lymphadenopathy:    He has no cervical adenopathy.  Neurological: He is alert and oriented to person, place, and time.  Skin: Skin is warm and dry. No rash noted. He is not diaphoretic. No erythema. No pallor.  Psychiatric: He has a normal mood and affect.    Patient Active Problem List   Diagnosis  . OSA (obstructive sleep apnea)  . Insomnia  . Nausea & vomiting  . Leukocytosis  . Hypokalemia  . Hepatic steatosis  . Paraspinal mass  . Paraspinal abscess  . MSSA (methicillin susceptible Staphylococcus aureus) septicemia  . Drug rash  . ARF (acute renal failure)  . Narcotic withdrawal  . Dehydration  . Tachycardia  . Chronic low back pain  . Hypertension  . Bradycardia  . Low magnesium levels  . Intractable nausea and vomiting  . Metabolic acidosis  . Duodenal ulcer  . Gastritis  . Reflux esophagitis  . Cholecystitis chronic, acute   Assessment/Plan: 1. Cholelithiasis with biliary dyskinesia. 2. Possible chronic cholecystitis 3. Hepatic Steatosis 4. Hiatal Hernia 5. Esophagitis 6. Morbid obesity  Plan:  Will discuss recent findings, clinical presentation and hx of c/o with Dr. Carolynne Edouard.   Not sure if this represents a surgical issue at this time, given his normal labs, lack of specific findings.  I agree that we should investigate possibility of central nausea associated with his spinal/paraspinal infection." An MRI of the back would be helpful in r/o this as a cause.  Recommend repletion of his potassium prn, monitor Mg levels.  Continue with PPI's, D/C NSAID's. prn use of anti-emetics. reintroduce antihypertensives. Pain management.  Keep NPO for now. Await biopsy results as well as they may give [place] some answers.  Thank you for this interesting consult we will continue to follow with you.    Blenda Mounts ACNP [facility]  10/29/2011, 1:00 PM

## 2011-10-29 NOTE — Progress Notes (Signed)
[facility] PROGRESS NOTE  John Brandt ZOX:[id number] DOB: March 31, 1950 DOA: 10/27/2011 PCP: Londell Moh, MD  Persistent nausea and vomiting  EGD done  shows signs of severe distal esophagitis, duodenitis and 2 small gastric ulcer but as per Gi apprers to be secondary to intractable vomiting rather than cause of it.  bx sent for H pylori.  -continue phenergan and reglan q 6hr prn  continue PPI and IV hydration  still has anion gap with metabolic acidosis from vomiting. Slowly improving.  -US abdomen shows cholelithiasis . 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 Repeated on 10/25 shows decreased GB ejection fraction of 20%. GI suggests this is likely causing his symptoms. Surgery consult called. will follow   Anion gap metabolic acidosis  associated with nausea dn vomiting. Now resolved  MSSA paraspinal abscess  completed 6 weeks of abx  Was scheduled to have MRI of thoracic and LS spine for follow up . patient still has low back pain. Spoke with Dr Dutch Quint today. recommends getting MRI if he is in the hospital next 1-2 days. Will hold off for now and can follow up as outpt Pain control with pr percocet   HTN Resume home meds. Noted to be hypotensive this am. Likely associated with nausea and vomiting. Added prn hydralazine  Code Status: Full code  Family Communication: wife at bedside  Disposition Plan: home once stable   Consultants:  Brodie ( GI) CCS to see  Procedures:  EGD on 10/25 HIDA scan on 10/25  Antibiotics:  None  HPI/Subjective:  Still has nausea and vomiting. No abdominal pain    Objective: Filed Vitals:   10/28/11 1623 10/28/11 2200 10/29/11 0600 10/29/11 0651  BP: 157/94 179/92 178/100 178/100  Pulse: 90 75 62   Temp: 98 F (36.7 C) 99 F (37.2 C) 99 F (37.2 C)   TempSrc: Oral Oral Oral   Resp: 20 20 20    Height:      Weight:      SpO2: 100% 94% 95%     Intake/Output Summary (Last 24 hours) at 10/29/11 1148 Last  data filed at 10/29/11 0659  Gross per 24 hour  Intake 2505.75 ml  Output      0 ml  Net 2505.75 ml   Filed Weights   10/27/11 2000  Weight: 114.352 kg (252 lb 1.6 oz)    Exam:  General: Elderly male in NAD  HEENT: no pallor, dry oral mucosa  Cardiovascular: NS1&S2, no murmurs  Respiratory: clear b/l, no added sounds  Abdomen: Soft, NT ND, bs+  EXT: warm, no edema  CNS: AAOX 3    Data Reviewed: Basic Metabolic Panel:  Lab 10/29/11 8119 10/27/11 1910 10/27/11 1605 10/27/11 1305  NA 136 -- 138 134*  K 3.0* -- 3.4* 4.2  CL 101 -- 102 99  CO2 21 -- 18* 16*  GLUCOSE 121* -- 94 94  BUN 8 -- 13 15  CREATININE 0.84 -- 1.10 1.11  CALCIUM 9.2 -- 9.4 10.1  MG -- 1.8 -- --  PHOS -- -- -- --   Liver Function Tests:  Lab 10/27/11 1305  AST 18  ALT 12  ALKPHOS 139*  BILITOT 0.4  PROT 8.4*  ALBUMIN 3.3*    Lab 10/27/11 1305  LIPASE 30  AMYLASE --   No results found for this basename: AMMONIA:5 in the last 168 hours CBC:  Lab 10/29/11 0552 10/27/11 1315  WBC 16.7* 15.6*  NEUTROABS -- 13.5*  HGB 12.5* 13.1  HCT 38.7* 38.8*  MCV 79.6 79.2  PLT 332 359   Cardiac Enzymes: No results found for this basename: CKTOTAL:5,CKMB:5,CKMBINDEX:5,TROPONINI:5 in the last 168 hours BNP (last 3 results) No results found for this basename: PROBNP:3 in the last 8760 hours CBG:  Lab 10/28/11 0746  GLUCAP 97    Recent Results (from the past 240 hour(s))  CULTURE, BLOOD (ROUTINE X 2)     Status: Normal (Preliminary result)   Collection Time   10/27/11  3:30 PM      Component Value Range Status Comment   Specimen Description BLOOD RIGHT HAND   Final    Special Requests BOTTLES DRAWN AEROBIC AND ANAEROBIC 5CC   Final    Culture  Setup Time 10/27/2011 22:26   Final    Culture     Final    Value:        BLOOD CULTURE RECEIVED NO GROWTH TO DATE CULTURE WILL BE HELD FOR 5 DAYS BEFORE ISSUING A FINAL NEGATIVE REPORT   Report Status PENDING   Incomplete   CULTURE, BLOOD (ROUTINE X  2)     Status: Normal (Preliminary result)   Collection Time   10/27/11  4:05 PM      Component Value Range Status Comment   Specimen Description BLOOD RIGHT ARM   Final    Special Requests BOTTLES DRAWN AEROBIC AND ANAEROBIC 5CC   Final    Culture  Setup Time 10/27/2011 22:26   Final    Culture     Final    Value:        BLOOD CULTURE RECEIVED NO GROWTH TO DATE CULTURE WILL BE HELD FOR 5 DAYS BEFORE ISSUING A FINAL NEGATIVE REPORT   Report Status PENDING   Incomplete      Studies: Dg Chest 2 View  10/27/2011  *RADIOLOGY REPORT*  Clinical Data: Leukocytosis and weakness.  Question pneumonia.  CHEST - 2 VIEW  Comparison: One-view chest 10/04/2011. Two-view chest 08/02/2011. Two-view chest 09/18/2006.  Findings: Cardiac enlargement is stable.  There is no edema or effusion to suggest failure.  A granuloma in the lingula is stable. Right lateral pleural thickening is stable.  A superior endplate fracture at L1 and inferior endplate fracture of T12 are new since July.  IMPRESSION:  1.  Stable cardiomegaly without failure. 2.  New superior endplate fracture at L1 and inferior endplate fracture at T12.  There is focal kyphosis at this level.   Original Report Authenticated By: Jamesetta Orleans. MATTERN, M.D.    US Abdomen Complete  10/27/2011  *RADIOLOGY REPORT*  Clinical Data:  Abdominal pain.  Vomiting.  History of gallstones.  COMPLETE ABDOMINAL ULTRASOUND  Comparison:  Plain films of 10/04/2011.  CT of 06/30/2011.  No prior ultrasound.  Findings:  Gallbladder:  Gallbladder stones at the 1.3 cm.  No wall thickening or pericholecystic fluid. Sonographic Murphy's sign was not elicited.  Common bile duct: Normal, 2 mm.  Liver: Increased echogenicity.  IVC: Negative  Pancreas:  Poorly visualized due to overlying bowel gas.  Spleen:  Normal in size and echogenicity.  Right Kidney:  12.2 cm.  Right renal upper pole cysts measuring 1.4 and 1.7 cm respectively. No hydronephrosis.  Left Kidney:  14.9 cm. No  hydronephrosis.  Dominant cyst measuring 8.9 cm.  Abdominal aorta:  Poorly visualized due to overlying bowel gas.  No aneurysm or ascites identified.  IMPRESSION: No acute process or explanation for vomiting or abdominal pain.  Cholelithiasis without cholecystitis.  Hepatic steatosis.   Original Report Authenticated By: Karn Cassis.  Reche Dixon, M.D.    Nm Hepato W/eject Fract  10/28/2011  *RADIOLOGY REPORT*  Clinical Data:  Abdominal pain and nausea vomiting. Cholelithiasis.  NUCLEAR MEDICINE HEPATOBILIARY IMAGING WITH GALLBLADDER EF  Technique:  Sequential images of the abdomen were obtained out to 60 minutes following intravenous administration of radiopharmaceutical. After oral ingestion of Ensure, gallbladder ejection fraction was determined.  Radiopharmaceutical:  5.5 mCi Tc-9m Choletec  Comparison: None.  Findings: Prompt radiopharmaceutical uptake by the liver is seen. The liver is normal in appearance.  Prompt biliary excretion activity is demonstrated.  Gallbladder activity is seen initially on the 15-minute image.  Biliary activity reaches small bowel by 30 minutes.  Gallbladder ejection fraction:  20%. Normal gallbladder ejection fraction with Ensure is greater than 33%.  The patient did not experience symptoms after oral ingestion of Ensure.  IMPRESSION:  1.  No evidence of cystic duct or biliary obstruction. 2.  Decreased gallbladder ejection fraction of 20%.   Original Report Authenticated By: Danae Orleans, M.D.     Scheduled Meds:   . amLODipine  10 mg Oral Daily  . fentaNYL      . fentaNYL  12.5 mcg Intravenous Once  . lidocaine  1 patch Transdermal Q24H  . metoCLOPramide (REGLAN) injection  10 mg Intravenous Q6H  . metoprolol succinate  50 mg Oral Daily  . multivitamin with minerals  1 tablet Oral Daily  . pantoprazole (PROTONIX) IV  40 mg Intravenous Q12H  . potassium chloride  40 mEq Oral Once  . sucralfate  1 g Oral Q6H   Continuous Infusions:   . sodium chloride 75 mL/hr at  10/29/11 0406  . DISCONTD: sodium chloride 20 mL/hr at 10/28/11 1610       Time spent: 25 MINUTES    Eddie North  [facility] Pager [phone] If 8PM-8AM, please contact night-coverage at [email or web address], password [facility] 10/29/2011, 11:48 AM  LOS: 2 days

## 2011-10-29 NOTE — Progress Notes (Signed)
Patient refuses to use nocturnal CPAP tonight. Equipment remains at bedside, set up and available for use if he should consider being compliant. He states he has tried the machine and does not tolerate it. He complains it has much more noise than his home equipment. He is aware that he may notify his nurse at anytime if he should change his mind or require assistance from RT. RN aware.

## 2011-10-30 ENCOUNTER — Inpatient Hospital Stay (HOSPITAL_COMMUNITY): Payer: BC Managed Care – PPO

## 2011-10-30 LAB — CBC WITH DIFFERENTIAL/PLATELET
Eosinophils Absolute: 0.1 10*3/uL (ref 0.0–0.7)
Eosinophils Relative: 1 % (ref 0–5)
HCT: 38.9 % — ABNORMAL LOW (ref 39.0–52.0)
Hemoglobin: 12.8 g/dL — ABNORMAL LOW (ref 13.0–17.0)
Lymphocytes Relative: 10 % — ABNORMAL LOW (ref 12–46)
Lymphs Abs: 1.5 10*3/uL (ref 0.7–4.0)
MCH: 26.2 pg (ref 26.0–34.0)
MCV: 79.6 fL (ref 78.0–100.0)
Monocytes Relative: 8 % (ref 3–12)
RBC: 4.89 MIL/uL (ref 4.22–5.81)

## 2011-10-30 LAB — BASIC METABOLIC PANEL
BUN: 8 mg/dL (ref 6–23)
CO2: 22 mEq/L (ref 19–32)
Calcium: 9.2 mg/dL (ref 8.4–10.5)
GFR calc non Af Amer: >90 mL/min (ref >90)
Glucose, Bld: 144 mg/dL — ABNORMAL HIGH (ref 70–99)

## 2011-10-30 MED ORDER — POTASSIUM CHLORIDE 10 MEQ/100ML IV SOLN
10.0000 meq | INTRAVENOUS | Status: AC
  Administered 2011-10-30 (×4): 10 meq via INTRAVENOUS
  Filled 2011-10-30 (×4): qty 100

## 2011-10-30 MED ORDER — VANCOMYCIN HCL 1000 MG IV SOLR
2500.0000 mg | Freq: Once | INTRAVENOUS | Status: AC
  Administered 2011-10-30: 2500 mg via INTRAVENOUS
  Filled 2011-10-30: qty 2500

## 2011-10-30 MED ORDER — HYDRALAZINE HCL 20 MG/ML IJ SOLN
20.0000 mg | Freq: Four times a day (QID) | INTRAMUSCULAR | Status: DC | PRN
  Administered 2011-10-30 – 2011-10-31 (×2): 20 mg via INTRAVENOUS
  Filled 2011-10-30 (×2): qty 1

## 2011-10-30 MED ORDER — METOPROLOL SUCCINATE ER 100 MG PO TB24
100.0000 mg | ORAL_TABLET | Freq: Every day | ORAL | Status: DC
  Administered 2011-10-30 – 2011-11-03 (×5): 100 mg via ORAL
  Filled 2011-10-30 (×5): qty 1

## 2011-10-30 MED ORDER — GADOBENATE DIMEGLUMINE 529 MG/ML IV SOLN
20.0000 mL | Freq: Once | INTRAVENOUS | Status: AC | PRN
  Administered 2011-10-30: 20 mL via INTRAVENOUS

## 2011-10-30 MED ORDER — HYDRALAZINE HCL 25 MG PO TABS
25.0000 mg | ORAL_TABLET | Freq: Four times a day (QID) | ORAL | Status: DC | PRN
  Administered 2011-10-30: 25 mg via ORAL
  Filled 2011-10-30: qty 1

## 2011-10-30 MED ORDER — VANCOMYCIN HCL IN DEXTROSE 1-5 GM/200ML-% IV SOLN
1000.0000 mg | Freq: Two times a day (BID) | INTRAVENOUS | Status: DC
  Administered 2011-10-31 – 2011-11-01 (×2): 1000 mg via INTRAVENOUS
  Filled 2011-10-30 (×4): qty 200

## 2011-10-30 MED ORDER — FLUTICASONE PROPIONATE 50 MCG/ACT NA SUSP
2.0000 | Freq: Every day | NASAL | Status: DC
  Administered 2011-10-30 – 2011-11-03 (×5): 2 via NASAL
  Filled 2011-10-30: qty 16

## 2011-10-30 NOTE — Progress Notes (Addendum)
ANTIBIOTIC CONSULT NOTE - INITIAL  Pharmacy Consult for Vancomycin Indication: paraspinal abscess  Allergies  Allergen Reactions  . Fish-Derived Products Nausea And Vomiting  . Cefazolin Rash    rash  . Iodine Rash  . Penicillins Rash    Patient Measurements: Height: 6\' 1"  (185.4 cm) Weight: 252 lb 1.6 oz (114.352 kg) IBW/kg (Calculated) : 79.9   Vital Signs: Temp: 98.4 F (36.9 C) (10/27 1432) Temp src: Oral (10/27 1432) BP: 140/88 mmHg (10/27 1432) Pulse Rate: 122  (10/27 1432)  Labs:  [facility] 10/30/11 0601 10/29/11 0552  WBC 15.2* 16.7*  HGB 12.8* 12.5*  PLT 365 332  LABCREA -- --  CREATININE 0.82 0.84   Estimated Creatinine Clearance: 123.8 ml/min (by C-G formula based on Cr of 0.82). No results found for this basename: VANCOTROUGH:2,VANCOPEAK:2,VANCORANDOM:2,GENTTROUGH:2,GENTPEAK:2,GENTRANDOM:2,TOBRATROUGH:2,TOBRAPEAK:2,TOBRARND:2,AMIKACINPEAK:2,AMIKACINTROU:2,AMIKACIN:2, in the last 72 hours   Microbiology: Recent Results (from the past 720 hour(s))  CULTURE, BLOOD (ROUTINE X 2)     Status: Normal (Preliminary result)   Collection Time   10/27/11  3:30 PM      Component Value Range Status Comment   Specimen Description BLOOD RIGHT HAND   Final    Special Requests BOTTLES DRAWN AEROBIC AND ANAEROBIC 5CC   Final    Culture  Setup Time 10/27/2011 22:26   Final    Culture     Final    Value:        BLOOD CULTURE RECEIVED NO GROWTH TO DATE CULTURE WILL BE HELD FOR 5 DAYS BEFORE ISSUING A FINAL NEGATIVE REPORT   Report Status PENDING   Incomplete   CULTURE, BLOOD (ROUTINE X 2)     Status: Normal (Preliminary result)   Collection Time   10/27/11  4:05 PM      Component Value Range Status Comment   Specimen Description BLOOD RIGHT ARM   Final    Special Requests BOTTLES DRAWN AEROBIC AND ANAEROBIC 5CC   Final    Culture  Setup Time 10/27/2011 22:26   Final    Culture     Final    Value:        BLOOD CULTURE RECEIVED NO GROWTH TO DATE CULTURE WILL BE HELD FOR 5  DAYS BEFORE ISSUING A FINAL NEGATIVE REPORT   Report Status PENDING   Incomplete     Medical History: Past Medical History  Diagnosis Date  . Hypertension   . Hyperlipidemia   . Allergic rhinitis   . Hx of cardiac cath   . Sleep apnea     sleep study 07-02-02  . Chronic back pain   . 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 Complication of anesthesia   . 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  This paraspinal abscess was biopsied 08/08/11 and grew MSSA for which the patient was treated with 6 weeks of Cefazolin 1g IV q8h with close outpatient f/u with ID.  Starting vancomycin empirically for new findings of worsening diskitis and worsening paraspinal abscess.  Dr. Jordan Likes reviewed the film and considers this a true infection vs a reactive change from previous infection and surgery. ID consulted.  CrCl~95 ml/min (normalized).  Patient weight is 114 kg.  WBC elevated, patient afebrile.  Blood cultures drawn.  Goal of Therapy:  Vancomycin trough level 15-20 mcg/ml  Plan:  Vancomycin 2500 mg IV load dose x 1 then start 1g IV q12h. F/u SCr, vancomycin trough, culture results, and ID recommendations.  Clance Boll 10/30/2011,5:28 PM

## 2011-10-30 NOTE — Progress Notes (Signed)
Subjective: Cross cover [facility] No significant changes since yesterday. Appreciate input from CCS.    Objective: Vital signs in last 24 hours: Temp:  [98.2 F (36.8 C)-98.7 F (37.1 C)] 98.4 F (36.9 C) (10/27 1432) Pulse Rate:  [86-122] 122  (10/27 1432) Resp:  [20] 20  (10/27 1432) BP: (140-181)/(87-110) 140/88 mmHg (10/27 1432) SpO2:  [94 %-97 %] 95 % (10/27 1432) Last BM Date: 10/26/11  Intake/Output from previous day: 10/26 0701 - 10/27 0700 In: 780 [I.V.:780] Out: -  Intake/Output this shift:   GPE: No changes since yesterday Chest: CTA S1 and S 2 normal.  Abdomen: morbidly obese, NTNABS.   Lab Results:  [facility] 10/30/11 0601 10/29/11 0552  WBC 15.2* 16.7*  HGB 12.8* 12.5*  HCT 38.9* 38.7*  PLT 365 332   BMET  Basename 10/30/11 0601 10/29/11 0552 10/27/11 1605  NA 135 136 138  K 2.7* 3.0* 3.4*  CL 101 101 102  CO2 22 21 18*  GLUCOSE 144* 121* 94  BUN 8 8 13   CREATININE 0.82 0.84 1.10  CALCIUM 9.2 9.2 9.4   LFT  Basename 10/29/11 0552  PROT 7.7  ALBUMIN 3.0*  AST 16  ALT 9  ALKPHOS 115  BILITOT 0.3  BILIDIR 0.1  IBILI 0.2*   Studies/Results: Mr Lumbar Spine W Wo Contrast  10/30/2011  *RADIOLOGY REPORT*  Clinical Data: Increased back pain with nausea and vomiting. History of T11-T12 diskitis/osteomyelitis status post 6-week course of antibiotics. Left T12 costotransversectomy with paraspinal biopsy 08/08/2010.  MRI LUMBAR SPINE WITHOUT AND WITH CONTRAST  Technique:  Multiplanar and multiecho pulse sequences of the lumbar spine were obtained without and with intravenous contrast.  Contrast: 20mL MULTIHANCE GADOBENATE DIMEGLUMINE 529 MG/ML IV SOLN  Comparison: Thoracic MRI 07/18/2011, abdominal pelvic CT 06/30/2011 and lumbar MRI 06/27/2011.  Findings: Current examination includes the lower thoracic and lumbar spine, extending from mid T8 through the mid sacrum on the sagittal images.  The changes of diskitis/osteomyelitis at T11-T12 have significantly  worsened compared with the prior examination.  There is endplate destruction with mild loss of disc height.  There is extensive marrow edema throughout the T11 and T12 vertebral bodies and its posterior elements.  There is irregular enhancing fluid within the T11-T12 disc. There is a peripherally enhancing fluid collection posteriorly in the surgical bed on the left, measuring 2.3 x 2.0 cm transverse.  There is extensive enhancing paraspinal inflammatory change anteriorly around the T11 and T12 vertebral bodies extending asymmetrically to the left where there is a small fluid collection. There is a small amount of epidural inflammation, but no epidural abscess or significant mass effect on the thecal sac is seen.  The additional disc spaces appear normal.  There is no evidence of acute fracture.  No abnormal intradural enhancement is seen.  The conus medullaris extends to the L1-L2 disc space level.  There is a small right pleural effusion.  Bilateral renal cysts are partially imaged.  Mild spondylosis in the lower lumbar spine appears unchanged.  IMPRESSION: Significant worsening of diskitis/osteomyelitis at T11-T12 with extensive paraspinal inflammatory change and paraspinal abscesses on the left.  No significant epidural abscess or mass effect on the thecal sac identified.  These results were called by telephone on 10/30/2011 at 1415 hours to Dr. Gonzella Lex, who verbally acknowledged these results.   Original Report Authenticated By: Gerrianne Scale, M.D.    Nm Hepato W/eject Fract  10/28/2011  *RADIOLOGY REPORT*  Clinical Data:  Abdominal pain and nausea vomiting. Cholelithiasis.  NUCLEAR MEDICINE HEPATOBILIARY IMAGING WITH GALLBLADDER EF  Technique:  Sequential images of the abdomen were obtained out to 60 minutes following intravenous administration of radiopharmaceutical. After oral ingestion of Ensure, gallbladder ejection fraction was determined.  Radiopharmaceutical:  5.5 mCi Tc-37m Choletec   Comparison: None.  Findings: Prompt radiopharmaceutical uptake by the liver is seen. The liver is normal in appearance.  Prompt biliary excretion activity is demonstrated.  Gallbladder activity is seen initially on the 15-minute image.  Biliary activity reaches small bowel by 30 minutes.  Gallbladder ejection fraction:  20%. Normal gallbladder ejection fraction with Ensure is greater than 33%.  The patient did not experience symptoms after oral ingestion of Ensure.  IMPRESSION:  1.  No evidence of cystic duct or biliary obstruction. 2.  Decreased gallbladder ejection fraction of 20%.   Original Report Authenticated By: Danae Orleans, M.D.    Medications: I have reviewed the patient's current medications.  Assessment/Plan: 1) Nausea and vomiting: biliary dyskinesia with cholelithiasis. CCS on board.  2) Esophagitis/duodenitis/gastric ulcers: on PPI's.  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  4) Morbid obesity inspite of 50lb lb weight loss.  LOS: 3 days   Batul Diego 10/30/2011, 3:38 PM

## 2011-10-30 NOTE — Progress Notes (Signed)
CRITICAL VALUE ALERT  Critical value received:  K+ 2.7  Date of notification:  [id number]  Time of notification:  0645  Critical value read back:Y  Nurse who received alert:  Orlean Bradford  MD notified (1st page):  Claiborne Billings  Time of first page:  0645  MD notified (2nd page):0645  Time of second [id number]  Responding MD:  Claiborne Billings  Time MD responded:  0700

## 2011-10-30 NOTE — Progress Notes (Addendum)
[facility] PROGRESS NOTE  Amish Mintzer GEX:[id number] DOB: 12-Sep-1950 DOA: 10/27/2011 PCP: Londell Moh, MD  Brief narrative: 61 Y/O obese male with hx of HTN, recent MSSA paraspinal abscess over T12 treated with 6 weeks of  abx, OSA with ongoing nausea and vomiting for past few months presents with persistent    Assessment/  Persistent nausea and vomiting  EGD done shows signs of severe distal esophagitis, duodenitis and 2 small gastric ulcer but as per Gi apprers to be secondary to intractable vomiting rather than cause of it.  bx sent for H pylori.  -continue phenergan and reglan q 6hr prn  continue PPI and IV hydration  still has anion gap with metabolic acidosis from vomiting. Slowly improving.  -US abdomen shows cholelithiasis . 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 Repeated on 10/25 shows decreased GB ejection fraction of 20%. GI suggests this is likely causing his symptoms.  Appreciate surgery eval. Recommend getting MRI of the spine to r/o persistent abscess before deciding on cholecystectomy.  Anion gap metabolic acidosis  associated with nausea and  vomiting. Now resolved.  MSSA paraspinal abscess  completed 6 weeks of abx  Was scheduled to have MRI of thoracic and LS spine for follow up . patient still has low back pain. Spoke with Dr Dutch Quint . recommends getting MRI to evaluate the paraspinal abscess Pain control with prn percocet.  Hypokalemia  k of 2.7 today . replenishing with kcl. Check mg level    HTN Elevated BP noted. increase dose of toprol. continue amlod. Increased prn hydralazine dose   Code Status: Full code  Family Communication: wife at bedside  Disposition Plan: home once stable   Consultants:  Brodie ( GI)  CCS   Procedures:  EGD on 10/25  HIDA scan on 10/25  MRI spine 10/27  Antibiotics:  None   HPI/Subjective: Still has nausea off and on, no vomiting. Noted for low k this am and elevated  BP  Objective: Filed Vitals:   10/29/11 1607 10/29/11 2243 10/30/11 0200 10/30/11 0651  BP: 181/87 173/104 155/88 167/110  Pulse: 88 86  105  Temp:  98.2 F (36.8 C)  98.2 F (36.8 C)  TempSrc:  Oral  Oral  Resp:  20  20  Height:      Weight:      SpO2:  94%  97%    Intake/Output Summary (Last 24 hours) at 10/30/11 1040 Last data filed at 10/29/11 1400  Gross per 24 hour  Intake    780 ml  Output      0 ml  Net    780 ml   Filed Weights   10/27/11 2000  Weight: 114.352 kg (252 lb 1.6 oz)    Exam:  General: Elderly male in NAD  HEENT: no pallor, Moist  oral mucosa  Cardiovascular: NS1&S2, no murmurs  Respiratory: clear b/l, no added sounds  Abdomen: Soft, NT ND, bs+  EXT: warm, no edema , no back tenderness CNS: AAOX 3    Data Reviewed: Basic Metabolic Panel:  Lab 10/30/11 0102 10/29/11 0552 10/27/11 1910 10/27/11 1605 10/27/11 1305  NA 135 136 -- 138 134*  K 2.7* 3.0* -- 3.4* 4.2  CL 101 101 -- 102 99  CO2 22 21 -- 18* 16*  GLUCOSE 144* 121* -- 94 94  BUN 8 8 -- 13 15  CREATININE 0.82 0.84 -- 1.10 1.11  CALCIUM 9.2 9.2 -- 9.4 10.1  MG -- -- 1.8 -- --  PHOS -- -- -- -- --  Liver Function Tests:  Lab 10/29/11 0552 10/27/11 1305  AST 16 18  ALT 9 12  ALKPHOS 115 139*  BILITOT 0.3 0.4  PROT 7.7 8.4*  ALBUMIN 3.0* 3.3*    Lab 10/27/11 1305  LIPASE 30  AMYLASE --   No results found for this basename: AMMONIA:5 in the last 168 hours CBC:  Lab 10/30/11 0601 10/29/11 0552 10/27/11 1315  WBC 15.2* 16.7* 15.6*  NEUTROABS 12.4* -- 13.5*  HGB 12.8* 12.5* 13.1  HCT 38.9* 38.7* 38.8*  MCV 79.6 79.6 79.2  PLT 365 332 359   Cardiac Enzymes: No results found for this basename: CKTOTAL:5,CKMB:5,CKMBINDEX:5,TROPONINI:5 in the last 168 hours BNP (last 3 results) No results found for this basename: PROBNP:3 in the last 8760 hours CBG:  Lab 10/28/11 0746  GLUCAP 97    Recent Results (from the past 240 hour(s))  CULTURE, BLOOD (ROUTINE X 2)      Status: Normal (Preliminary result)   Collection Time   10/27/11  3:30 PM      Component Value Range Status Comment   Specimen Description BLOOD RIGHT HAND   Final    Special Requests BOTTLES DRAWN AEROBIC AND ANAEROBIC 5CC   Final    Culture  Setup Time 10/27/2011 22:26   Final    Culture     Final    Value:        BLOOD CULTURE RECEIVED NO GROWTH TO DATE CULTURE WILL BE HELD FOR 5 DAYS BEFORE ISSUING A FINAL NEGATIVE REPORT   Report Status PENDING   Incomplete   CULTURE, BLOOD (ROUTINE X 2)     Status: Normal (Preliminary result)   Collection Time   10/27/11  4:05 PM      Component Value Range Status Comment   Specimen Description BLOOD RIGHT ARM   Final    Special Requests BOTTLES DRAWN AEROBIC AND ANAEROBIC 5CC   Final    Culture  Setup Time 10/27/2011 22:26   Final    Culture     Final    Value:        BLOOD CULTURE RECEIVED NO GROWTH TO DATE CULTURE WILL BE HELD FOR 5 DAYS BEFORE ISSUING A FINAL NEGATIVE REPORT   Report Status PENDING   Incomplete      Studies: Nm Hepato W/eject Fract  10/28/2011  *RADIOLOGY REPORT*  Clinical Data:  Abdominal pain and nausea vomiting. Cholelithiasis.  NUCLEAR MEDICINE HEPATOBILIARY IMAGING WITH GALLBLADDER EF  Technique:  Sequential images of the abdomen were obtained out to 60 minutes following intravenous administration of radiopharmaceutical. After oral ingestion of Ensure, gallbladder ejection fraction was determined.  Radiopharmaceutical:  5.5 mCi Tc-21m Choletec  Comparison: None.  Findings: Prompt radiopharmaceutical uptake by the liver is seen. The liver is normal in appearance.  Prompt biliary excretion activity is demonstrated.  Gallbladder activity is seen initially on the 15-minute image.  Biliary activity reaches small bowel by 30 minutes.  Gallbladder ejection fraction:  20%. Normal gallbladder ejection fraction with Ensure is greater than 33%.  The patient did not experience symptoms after oral ingestion of Ensure.  IMPRESSION:  1.  No  evidence of cystic duct or biliary obstruction. 2.  Decreased gallbladder ejection fraction of 20%.   Original Report Authenticated By: Danae Orleans, M.D.     Scheduled Meds:   . amLODipine  10 mg Oral Daily  . fluticasone  2 spray Each Nare Daily  . lidocaine  1 patch Transdermal Q24H  . metoCLOPramide (REGLAN) injection  10 mg  Intravenous Q6H  . metoprolol succinate  100 mg Oral Daily  . multivitamin with minerals  1 tablet Oral Daily  . pantoprazole (PROTONIX) IV  40 mg Intravenous Q12H  . potassium chloride  10 mEq Intravenous Q1 Hr x 4  . potassium chloride  10 mEq Intravenous Q1 Hr x 4  . sucralfate  1 g Oral Q6H  . DISCONTD: metoprolol succinate  50 mg Oral Daily   Continuous Infusions:   . DISCONTD: sodium chloride 75 mL/hr at 10/29/11 0406  . DISCONTD: dextrose 5 % and 0.45% NaCl 100 mL/hr at 10/30/11 0015       Time spent: 25 MINUTES    Eddie North  [facility] Pager [phone]. If 8PM-8AM, please contact night-coverage at [email or web address], password [email or web address] 10/30/2011, 10:40 AM  LOS: 3 days            New findings of worsening diskitis over T11-T12 and worsening paraspinal abscess over left side seen on MRI today. Discussed findings with  Patient's neurosurgeon Dr Jordan Likes who will evaluate.   After speaking with Dr Jordan Likes who reviewed the film and considers this a true infection vs a reactive change from previous infection and surgery. recommends getting ID on board and trying a course of IV abx again and see for response in 2 weeks . Since an IR guided aspiration was unsuccessful previously Dr Jordan Likes recommends it  may not be helpful to do so this time as well.  i have spoken to Dr Luciana Axe who recommended starting IV vancomycin and ID will evaluate patient in am. Ordered CRP an ESR  patient and his wife updated with plan.

## 2011-10-30 NOTE — Progress Notes (Signed)
Intractable nausea and vomiting  Subjective: Continues to have nausea overnight, no abd pain, jaundice or fevers  Objective: Vital signs in last 24 hours: Temp:  [98.2 F (36.8 C)-98.5 F (36.9 C)] 98.2 F (36.8 C) (10/27 0651) Pulse Rate:  [84-105] 105  (10/27 0651) Resp:  [20] 20  (10/27 0651) BP: (155-181)/(87-110) 167/110 mmHg (10/27 0651) SpO2:  [94 %-97 %] 97 % (10/27 0651) Last BM Date: 10/26/11  Intake/Output from previous day: 10/26 0701 - 10/27 0700 In: 780 [I.V.:780] Out: -  Intake/Output this shift:    General appearance: alert, cooperative and no distress GI: soft, nontender  Lab Results:  Results for orders placed during the hospital encounter of 10/27/11 (from the past 24 hour(s))  CBC WITH DIFFERENTIAL     Status: Abnormal   Collection Time   10/30/11  6:01 AM      Component Value Range   WBC 15.2 (*) 4.0 - 10.5 K/uL   RBC 4.89  4.22 - 5.81 MIL/uL   Hemoglobin 12.8 (*) 13.0 - 17.0 g/dL   HCT 16.1 (*) 09.6 - 04.5 %   MCV 79.6  78.0 - 100.0 fL   MCH 26.2  26.0 - 34.0 pg   MCHC 32.9  30.0 - 36.0 g/dL   RDW 40.9 (*) 81.1 - 91.4 %   Platelets 365  150 - 400 K/uL   Neutrophils Relative 81 (*) 43 - 77 %   Neutro Abs 12.4 (*) 1.7 - 7.7 K/uL   Lymphocytes Relative 10 (*) 12 - 46 %   Lymphs Abs 1.5  0.7 - 4.0 K/uL   Monocytes Relative 8  3 - 12 %   Monocytes Absolute 1.2 (*) 0.1 - 1.0 K/uL   Eosinophils Relative 1  0 - 5 %   Eosinophils Absolute 0.1  0.0 - 0.7 K/uL   Basophils Relative 0  0 - 1 %   Basophils Absolute 0.0  0.0 - 0.1 K/uL  BASIC METABOLIC PANEL     Status: Abnormal   Collection Time   10/30/11  6:01 AM      Component Value Range   Sodium 135  135 - 145 mEq/L   Potassium 2.7 (*) 3.5 - 5.1 mEq/L   Chloride 101  96 - 112 mEq/L   CO2 22  19 - 32 mEq/L   Glucose, Bld 144 (*) 70 - 99 mg/dL   BUN 8  6 - 23 mg/dL   Creatinine, Ser 7.82  0.50 - 1.35 mg/dL   Calcium 9.2  8.4 - 95.6 mg/dL   GFR calc non Af Amer >90  >90 mL/min   GFR calc Af  Amer >90  >90 mL/min     Studies/Results Radiology  HIDA: no obstruction, EF 20%   MEDS, Scheduled    . amLODipine  10 mg Oral Daily  . fluticasone  2 spray Each Nare Daily  . lidocaine  1 patch Transdermal Q24H  . metoCLOPramide (REGLAN) injection  10 mg Intravenous Q6H  . metoprolol succinate  50 mg Oral Daily  . multivitamin with minerals  1 tablet Oral Daily  . pantoprazole (PROTONIX) IV  40 mg Intravenous Q12H  . potassium chloride  10 mEq Intravenous Q1 Hr x 4  . potassium chloride  10 mEq Intravenous Q1 Hr x 4  . potassium chloride  40 mEq Oral Once  . sucralfate  1 g Oral Q6H     Assessment: Intractable nausea and vomiting   Plan: If MRI spine neg for perispinous abscess,  I think it would be reasonable to get his gallbladder out as the next step in his treatment plan.    LOS: 3 days    Vanita Panda, MD [facility], [place] [phone]   10/30/2011 8:04 AM

## 2011-10-31 ENCOUNTER — Encounter (HOSPITAL_COMMUNITY): Payer: Self-pay | Admitting: Internal Medicine

## 2011-10-31 DIAGNOSIS — I10 Essential (primary) hypertension: Secondary | ICD-10-CM

## 2011-10-31 DIAGNOSIS — M519 Unspecified thoracic, thoracolumbar and lumbosacral intervertebral disc disorder: Secondary | ICD-10-CM

## 2011-10-31 LAB — BASIC METABOLIC PANEL
BUN: 11 mg/dL (ref 6–23)
CO2: 23 mEq/L (ref 19–32)
Calcium: 9 mg/dL (ref 8.4–10.5)
GFR calc non Af Amer: >90 mL/min (ref >90)
Glucose, Bld: 110 mg/dL — ABNORMAL HIGH (ref 70–99)
Potassium: 3 mEq/L — ABNORMAL LOW (ref 3.5–5.1)

## 2011-10-31 LAB — CBC WITH DIFFERENTIAL/PLATELET
Basophils Absolute: 0 10*3/uL (ref 0.0–0.1)
Eosinophils Relative: 1 % (ref 0–5)
HCT: 39 % (ref 39.0–52.0)
Hemoglobin: 12.9 g/dL — ABNORMAL LOW (ref 13.0–17.0)
Lymphocytes Relative: 8 % — ABNORMAL LOW (ref 12–46)
Lymphs Abs: 1.1 10*3/uL (ref 0.7–4.0)
MCV: 79.3 fL (ref 78.0–100.0)
Monocytes Absolute: 1.3 10*3/uL — ABNORMAL HIGH (ref 0.1–1.0)
Monocytes Relative: 9 % (ref 3–12)
Neutro Abs: 12.1 10*3/uL — ABNORMAL HIGH (ref 1.7–7.7)
RDW: 15.9 % — ABNORMAL HIGH (ref 11.5–15.5)
WBC: 14.6 10*3/uL — ABNORMAL HIGH (ref 4.0–10.5)

## 2011-10-31 MED ORDER — HYDRALAZINE HCL 25 MG PO TABS
25.0000 mg | ORAL_TABLET | Freq: Three times a day (TID) | ORAL | Status: DC
Start: 2011-10-31 — End: 2011-10-31
  Filled 2011-10-31 (×3): qty 1

## 2011-10-31 MED ORDER — BOOST / RESOURCE BREEZE PO LIQD: 1.0000 | ORAL | Status: DC | PRN

## 2011-10-31 MED ORDER — POTASSIUM CHLORIDE CRYS ER 20 MEQ PO TBCR
40.0000 meq | EXTENDED_RELEASE_TABLET | Freq: Once | ORAL | Status: AC
  Administered 2011-10-31: 40 meq via ORAL
  Filled 2011-10-31: qty 2

## 2011-10-31 MED ORDER — CLONIDINE HCL 0.2 MG PO TABS
0.2000 mg | ORAL_TABLET | Freq: Three times a day (TID) | ORAL | Status: DC
  Administered 2011-10-31 – 2011-11-03 (×10): 0.2 mg via ORAL
  Filled 2011-10-31 (×12): qty 1

## 2011-10-31 NOTE — Progress Notes (Signed)
He has many other potential causes for his nausea other than gallstones.  These are likely incidentally found.  He may need elective cholecystectomy in the near future but would wait until the other issues are resolved to see if these gallstones are really symptomatic.

## 2011-10-31 NOTE — Progress Notes (Signed)
[facility] PROGRESS NOTE  John Brandt JYN:[id number] DOB: 1950/06/29 DOA: 10/27/2011 PCP: Londell Moh, MD  Brief narrative:  61 Y/O obese male with hx of HTN, recent MSSA paraspinal abscess over T12 treated with 6 weeks of abx, OSA with ongoing nausea and vomiting for past few months presents with persistent .  Assessment/  Persistent nausea and vomiting  EGD done shows signs of severe distal esophagitis, duodenitis and 2 small gastric ulcer but as per Gi apprers to be secondary to intractable vomiting rather than cause of it.  bx sent for H pylori.  -continue phenergan and reglan q 6hr prn  continue PPI and IV hydration  -US abdomen shows cholelithiasis . 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 Repeated on 10/25 shows decreased GB ejection fraction of 20%. GI suggests this is likely causing his symptoms.  Appreciate surgery eval. Recommend getting MRI of the spine to r/o persistent abscess before deciding on cholecystectomy.   Anion gap metabolic acidosis  associated with nausea and vomiting. Now resolved.   MSSA paraspinal abscess  completed 6 weeks of abx ( cefazolin)  in august  . patient still has low back pain. Spoke with Dr Dutch Quint .  MRI done to evaluate the paraspinal abscess as follow up shows worsening diskitis at T12 with worsening left  paraspinal abscess . Discussed with Dr Jordan Likes who reviewed the film and considers this a true infection vs a reactive change from previous infection and surgery. recommends getting ID on board and trying a course of IV abx again and see for response in 2 weeks . Since an IR guided aspiration was unsuccessful previously Dr Jordan Likes recommends it may not be helpful to do so this time as well.ID consulted and  recommended starting IV vancomycin and ID will evaluate patient today. Elevated  CRP and ESR    Pain control with prn percocet.  Hypokalemia  Replenished with kcl and mg,s till low at 3. Ordered kcl  further  HTN Elevated BP noted. increase dose of toprol. continue amlod. Increased prn hydralazine dose . Appears patient is on clonidine at home and want getting it here. Have resumed it. Will follow.  Code Status: Full code  Family Communication: wife at bedside  Disposition Plan: home once stable . possibly needs long term abx again. Pending ID recs.   Consultants:  Juanda Chance ( GI)  CCS  Neurosurgery ( Dr pool-- discussed over the phone on 10/.27) ID consult this am  Procedures:  EGD on 10/25  HIDA scan on 10/25  MRI spine 10/27    Antibiotics:  IV vanco ( 10/27--)  HPI/Subjective: Infomrs his Nausea to have improved this am. Noted for uncontrolled BP. On clonidine at home which is resumed.   Objective: Filed Vitals:   10/30/11 1432 10/30/11 2233 10/31/11 0707 10/31/11 0837  BP: 140/88 162/106 167/112 169/102  Pulse: 122 97 103 101  Temp: 98.4 F (36.9 C) 98 F (36.7 C) 97.4 F (36.3 C)   TempSrc: Oral Oral Oral   Resp: 20 20 20    Height:      Weight:      SpO2: 95% 97% 95%     Intake/Output Summary (Last 24 hours) at 10/31/11 0954 Last data filed at 10/30/11 1400  Gross per 24 hour  Intake   2700 ml  Output      0 ml  Net   2700 ml   Filed Weights   10/27/11 2000  Weight: 114.352 kg (252 lb 1.6 oz)    Exam:  General: Elderly male in NAD  HEENT: no pallor, Moist oral mucosa  Cardiovascular: NS1&S2, no murmurs  Respiratory: clear b/l, no added sounds  Abdomen: Soft, NT ND, bs+  EXT: warm, no edema , no back tenderness  CNS: AAOX 3    Data Reviewed: Basic Metabolic Panel:  Lab 10/31/11 4098 10/30/11 0601 10/29/11 0552 10/27/11 1910 10/27/11 1605 10/27/11 1305  NA 138 135 136 -- 138 134*  K 3.0* 2.7* 3.0* -- 3.4* 4.2  CL 104 101 101 -- 102 99  CO2 23 22 21  -- 18* 16*  GLUCOSE 110* 144* 121* -- 94 94  BUN 11 8 8  -- 13 15  CREATININE 0.80 0.82 0.84 -- 1.10 1.11  CALCIUM 9.0 9.2 9.2 -- 9.4 10.1  MG -- 1.7 -- 1.8 -- --  PHOS -- -- -- -- -- --    Liver Function Tests:  Lab 10/29/11 0552 10/27/11 1305  AST 16 18  ALT 9 12  ALKPHOS 115 139*  BILITOT 0.3 0.4  PROT 7.7 8.4*  ALBUMIN 3.0* 3.3*    Lab 10/27/11 1305  LIPASE 30  AMYLASE --   No results found for this basename: AMMONIA:5 in the last 168 hours CBC:  Lab 10/31/11 0510 10/30/11 0601 10/29/11 0552 10/27/11 1315  WBC 14.6* 15.2* 16.7* 15.6*  NEUTROABS 12.1* 12.4* -- 13.5*  HGB 12.9* 12.8* 12.5* 13.1  HCT 39.0 38.9* 38.7* 38.8*  MCV 79.3 79.6 79.6 79.2  PLT 341 365 332 359   Cardiac Enzymes: No results found for this basename: CKTOTAL:5,CKMB:5,CKMBINDEX:5,TROPONINI:5 in the last 168 hours BNP (last 3 results) No results found for this basename: PROBNP:3 in the last 8760 hours CBG:  Lab 10/28/11 0746  GLUCAP 97    Recent Results (from the past 240 hour(s))  CULTURE, BLOOD (ROUTINE X 2)     Status: Normal (Preliminary result)   Collection Time   10/27/11  3:30 PM      Component Value Range Status Comment   Specimen Description BLOOD RIGHT HAND   Final    Special Requests BOTTLES DRAWN AEROBIC AND ANAEROBIC 5CC   Final    Culture  Setup Time 10/27/2011 22:26   Final    Culture     Final    Value:        BLOOD CULTURE RECEIVED NO GROWTH TO DATE CULTURE WILL BE HELD FOR 5 DAYS BEFORE ISSUING A FINAL NEGATIVE REPORT   Report Status PENDING   Incomplete   CULTURE, BLOOD (ROUTINE X 2)     Status: Normal (Preliminary result)   Collection Time   10/27/11  4:05 PM      Component Value Range Status Comment   Specimen Description BLOOD RIGHT ARM   Final    Special Requests BOTTLES DRAWN AEROBIC AND ANAEROBIC 5CC   Final    Culture  Setup Time 10/27/2011 22:26   Final    Culture     Final    Value:        BLOOD CULTURE RECEIVED NO GROWTH TO DATE CULTURE WILL BE HELD FOR 5 DAYS BEFORE ISSUING A FINAL NEGATIVE REPORT   Report Status PENDING   Incomplete      Studies: Mr Lumbar Spine W Wo Contrast  10/30/2011  *RADIOLOGY REPORT*  Clinical Data: Increased  back pain with nausea and vomiting. History of T11-T12 diskitis/osteomyelitis status post 6-week course of antibiotics. Left T12 costotransversectomy with paraspinal biopsy 08/08/2010.  MRI LUMBAR SPINE WITHOUT AND WITH CONTRAST  Technique:  Multiplanar and multiecho  pulse sequences of the lumbar spine were obtained without and with intravenous contrast.  Contrast: 20mL MULTIHANCE GADOBENATE DIMEGLUMINE 529 MG/ML IV SOLN  Comparison: Thoracic MRI 07/18/2011, abdominal pelvic CT 06/30/2011 and lumbar MRI 06/27/2011.  Findings: Current examination includes the lower thoracic and lumbar spine, extending from mid T8 through the mid sacrum on the sagittal images.  The changes of diskitis/osteomyelitis at T11-T12 have significantly worsened compared with the prior examination.  There is endplate destruction with mild loss of disc height.  There is extensive marrow edema throughout the T11 and T12 vertebral bodies and its posterior elements.  There is irregular enhancing fluid within the T11-T12 disc. There is a peripherally enhancing fluid collection posteriorly in the surgical bed on the left, measuring 2.3 x 2.0 cm transverse.  There is extensive enhancing paraspinal inflammatory change anteriorly around the T11 and T12 vertebral bodies extending asymmetrically to the left where there is a small fluid collection. There is a small amount of epidural inflammation, but no epidural abscess or significant mass effect on the thecal sac is seen.  The additional disc spaces appear normal.  There is no evidence of acute fracture.  No abnormal intradural enhancement is seen.  The conus medullaris extends to the L1-L2 disc space level.  There is a small right pleural effusion.  Bilateral renal cysts are partially imaged.  Mild spondylosis in the lower lumbar spine appears unchanged.  IMPRESSION: Significant worsening of diskitis/osteomyelitis at T11-T12 with extensive paraspinal inflammatory change and paraspinal abscesses on the  left.  No significant epidural abscess or mass effect on the thecal sac identified.  These results were called by telephone on 10/30/2011 at 1415 hours to Dr. Gonzella Lex, who verbally acknowledged these results.   Original Report Authenticated By: Gerrianne Scale, M.D.     Scheduled Meds:   . amLODipine  10 mg Oral Daily  . cloNIDine  0.2 mg Oral TID  . fluticasone  2 spray Each Nare Daily  . lidocaine  1 patch Transdermal Q24H  . metoCLOPramide (REGLAN) injection  10 mg Intravenous Q6H  . metoprolol succinate  100 mg Oral Daily  . multivitamin with minerals  1 tablet Oral Daily  . pantoprazole (PROTONIX) IV  40 mg Intravenous Q12H  . potassium chloride  10 mEq Intravenous Q1 Hr x 4  . potassium chloride  40 mEq Oral Once  . sucralfate  1 g Oral Q6H  . vancomycin  2,500 mg Intravenous Once  . vancomycin  1,000 mg Intravenous Q12H  . DISCONTD: hydrALAZINE  25 mg Oral Q8H  . DISCONTD: metoprolol succinate  50 mg Oral Daily   Continuous Infusions:   . DISCONTD: dextrose 5 % and 0.45% NaCl 100 mL/hr at 10/30/11 0015      Time spent: 35 MINUTES    Lyndell Gillyard  [facility] Pager [phone] If 8PM-8AM, please contact night-coverage at [email or web address], password [facility] 10/31/2011, 9:54 AM  LOS: 4 days

## 2011-10-31 NOTE — Progress Notes (Addendum)
INITIAL ADULT NUTRITION ASSESSMENT Date: 10/31/2011   Time: 3:57 PM Reason for Assessment: Nutrition risk   INTERVENTION:  - Recommend MD initiate enteral nutrition as pt with severe malnutrition of chronic illness and minimal intake since admission. Recommend post-pyloric TF to promote GI tolerance and use of elemental formula. (Recommend TF of Vital 1.2 AF start at 58ml/hr increase by 10ml q8hr to goal of 11ml/hr. This will provide 2016 calories, 126g protein, free water - meeting 100% estimated calorie needs, 126% estimated protein needs. If IVF d/c, recommend water flushes q6hr.)  - Recommend MD d/c Carafate as pt reports the taste of the medication makes him vomit. - Recommend MD continue to monitor pt's potassium, magnesium, and phosphorus as pt at high risk of refeeding syndrome and monitor closely if nutrition support initiated.  - Resource Breeze PRN for additional nutrition as pt willing to try and nausea improves. Will continue to monitor.   Pt meets criteria for severe malnutrition of chronic illness AEB <75% estimated energy intake for the past 5 months with 16.5% weight loss during this time frame, 13.6% of this weight loss in the past 2 months per past medical records and weight trend.   ASSESSMENT: Male 61 y.o.  Dx: Intractable nausea and vomiting  Food/Nutrition Related Hx: Pt known to RD from previous admission at the end of last month. Pt reports 50 pound unintended weight loss since the end of May this year r/t ongoing nausea/vomiting. Pt has lost 40 pounds unintentionally in the past month (13.7% weight loss). Pt reports sometimes he has been able to eat solid foods, but the majority of the past 5 months has been spent eating jello, applesauce, and a trial of bland foods. Wife reports pt drank Ensure in the middle of the morning on/off since most recent discharge.  MD noted pt has not had a regular meal in the past 6 months. Pt admitted with worsening  nausea/vomiting with 15 reported episodes of vomiting for 2 days PTA. Met with pt who reports small improvement in nausea with last emesis yesterday after getting Carafate. Pt states the taste of the medication made him vomit. Pt reports drinking apple juice today which he tolerated. Noted pt with low potassium, most recent magnesium WNL.   EGD on 10/25 showed: 1. 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 Medium sized hiatal hernia , 3 cm in length  3. Two ulcers were found at the gastroesophageal junction -  biopsies to r/o Barrett's esophagus  4. Gastritis (inflammation) was found on the anterior wall of the  gastric antrum; biopsy  5. Moderately severe duodenitis and small duodenal ulcer, appears  benign, biopsies taken  Hx:  Past Medical History  Diagnosis Date  . Hypertension   . Hyperlipidemia   . Allergic rhinitis   . Hx of cardiac cath   . Sleep apnea     sleep study 07-02-02  . Chronic back pain   . 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 Complication of anesthesia   . PONV (postoperative nausea and vomiting)    Related Meds:  Scheduled Meds:   . amLODipine  10 mg Oral Daily  . cloNIDine  0.2 mg Oral TID  . fluticasone  2 spray Each Nare Daily  . lidocaine  1 patch Transdermal Q24H  . metoCLOPramide (REGLAN) injection  10 mg Intravenous Q6H  .  metoprolol succinate  100 mg Oral Daily  . multivitamin with minerals  1 tablet Oral Daily  . pantoprazole (PROTONIX) IV  40 mg Intravenous Q12H  . potassium chloride  40 mEq Oral Once  . sucralfate  1 g Oral Q6H  . vancomycin  2,500 mg Intravenous Once  . vancomycin  1,000 mg Intravenous Q12H  . DISCONTD: hydrALAZINE  25 mg Oral Q8H   Continuous Infusions:  PRN Meds:.hydrALAZINE, ondansetron (ZOFRAN) IV, ondansetron, oxyCODONE-acetaminophen, promethazine, senna-docusate, DISCONTD: hydrALAZINE  Ht: 6\' 1"  (185.4 cm)  Wt:  252 lb 1.6 oz (114.352 kg)  Ideal Wt: 184 lb % Ideal Wt: 137  Usual Wt: 303 lb  % Usual Wt: 83  Wt Readings from Last 10 Encounters:  10/27/11 252 lb 1.6 oz (114.352 kg)  10/27/11 252 lb 1.6 oz (114.352 kg)  10/27/11 252 lb 1.6 oz (114.352 kg)  10/03/11 263 lb 14.4 oz (119.704 kg)  09/19/11 277 lb (125.646 kg)  09/14/11 273 lb (123.832 kg)  08/25/11 294 lb (133.358 kg)  08/08/11 292 lb 5.3 oz (132.6 kg)  08/08/11 292 lb 5.3 oz (132.6 kg)  08/02/11 292 lb 6.4 oz (132.632 kg)     Body mass index is 33.26 kg/(m^2). Class I obesity    Labs:  CMP     Component Value Date/Time   NA 138 10/31/2011 0510   K 3.0* 10/31/2011 0510   CL 104 10/31/2011 0510   CO2 23 10/31/2011 0510   GLUCOSE 110* 10/31/2011 0510   BUN 11 10/31/2011 0510   CREATININE 0.80 10/31/2011 0510   CREATININE 0.80 09/14/2011 1544   CALCIUM 9.0 10/31/2011 0510   PROT 7.7 10/29/2011 0552   ALBUMIN 3.0* 10/29/2011 0552   AST 16 10/29/2011 0552   ALT 9 10/29/2011 0552   ALKPHOS 115 10/29/2011 0552   BILITOT 0.3 10/29/2011 0552   GFRNONAA >90 10/31/2011 0510   GFRAA >90 10/31/2011 0510   No intake or output data in the 24 hours ending 10/31/11 1610  Last BM - 10/23  Diet Order: Sodium Restricted    IVF:    Estimated Nutritional Needs:   Kcal:2000-2400 Protein:100-120g Fluid:2-2.4L  NUTRITION DIAGNOSIS: -Inadequate oral intake (NI-2.1).  Status: Ongoing  RELATED TO: persistent nausea/vomiting  AS EVIDENCE BY: 0% meal intake, just sips of apple juice per pt report   MONITORING/EVALUATION(Goals):  1. Resolution of nausea/vomiting 2. Recommend post-pyloric enteral nutrition as pt with inadequate oral intake since admission and severe malnutrition.   EDUCATION NEEDS: -No education needs identified at this time   Dietitian #: [phone]  DOCUMENTATION CODES Per approved criteria  -Severe malnutrition in the context of chronic illness -Obesity Unspecified    Marshall Cork 10/31/2011, 3:57 PM

## 2011-10-31 NOTE — Consult Note (Signed)
[facility]  Total days of antibiotics 2        Day 2 vancomycin               Reason for Consult:worsening T11-T12 osteomyelitis    Referring Physician: Dutch Quint  Principal Problem:  *Intractable nausea and vomiting Active Problems:  Leukocytosis  Hypokalemia  Paraspinal abscess  Dehydration  Metabolic acidosis  Duodenal ulcer  Gastritis  Reflux esophagitis  Cholecystitis chronic, acute    HPI: John Brandt is a 61 y.o. male is a pleasant caucasian male with hx of HTN, HLD, obesity who at the end of July 2013, had acute onset of fever, nausea/vomiting and low back pain. He was found to have a T12 paravertebral fluid collection/mass that was originally aspirated with culture being negative. Due to progression of back pain and MRI changes he underwent T12 lumbar laminectomy on 8/5, OR cultures identified MSSA. 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 He was placed on cefazolin for 6 wks, however, he developed a diffuse pruritic macular papular rash concerning for drug allergic reaction. Thus, he was changed to vancomycin on 9/9 to finish out his course of 6 wks of IV antibiotics on 9/16. He was followed during this time by the ID clinic and deemed that he no longer required further antibiotics since his inflammatory markers had nearly normalized.   During this 6 wks of antibiotics, the patient not only developed a rash to antibiotics, but felt quite poorly during this time with back pain, nausea, and vomiting, and poor appetitie/anorexia/weight loss. It was initially thought that his n/v was due to side effects of pain medication +/- malaise related to parental IV antibiotics. Once he finished taking his antibiotics, he still did poorly in part, weaning of opiates due to back pain. He had admission on 9/29 for having some withdrawal due to weaning from fentanyl patch too quickly. On discharge he was given  a plan to how to have stepwise weaning from his pain regimen.  He represented to the ED on 10/24 with nausea/vomiting/ leukocytosis and abdominal discomfort.he reported that his emesis was dark brown. He was found to be afebrile but had leukocytosis of 15, metabolic alkalosis. He was admitted for work up on his intractable vomiting. He reported having discontinued his tramadol for 48hr. For a scheduled myelogram ordered by Dr. Dutch Quint. As this was not thought to be opiate withdrawal, he was admitted for work up of his GI symptoms. He underwent a EGD which showed gastritis, likely due to NSAID use. He also had a hidascan which showed his gallbladder decreased function. He had repeat MRI of lumbar spine which shows worsening T11-T12 osteomyelitis and " a peripherally enhancing fluid collection posteriorly in the surgical bed on the left, measuring 2.3 x 2.0 cm transverse. There is extensive enhancing paraspinal inflammatory change anteriorly around the T11 and T12 vertebral bodies extending asymmetrically to the left where there is a small fluid collection. There is a small amount of epidural inflammation, but no epidural abscess or significant mass effect on the thecal sac is seen." The patient was placed back on vancomycin and decision is being weighed by Dr. Dutch Quint to whether the patient needs urgent back surgery. General surgery is also weighing in to see if the patient would benefit for cholecystectomy as this maybe possible cause of his ongoing N/V.  The patient states he has been able to only tolerate clears for the last  2 days. Mostly water, a few sips of apple juice and broth.  Past Medical History  Diagnosis Date  . Hypertension   . Hyperlipidemia   . Allergic rhinitis   . Hx of cardiac cath   . Sleep apnea     sleep study 07-02-02  . Chronic back pain   . 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 Complication of anesthesia     . PONV (postoperative nausea and vomiting)     Allergies:  Allergies  Allergen Reactions  . Fish-Derived Products Nausea And Vomiting  . Cefazolin Rash    rash  . Iodine Rash  . Penicillins Rash   MEDICATIONS:    . amLODipine  10 mg Oral Daily  . cloNIDine  0.2 mg Oral TID  . fluticasone  2 spray Each Nare Daily  . lidocaine  1 patch Transdermal Q24H  . metoCLOPramide (REGLAN) injection  10 mg Intravenous Q6H  . metoprolol succinate  100 mg Oral Daily  . multivitamin with minerals  1 tablet Oral Daily  . pantoprazole (PROTONIX) IV  40 mg Intravenous Q12H  . potassium chloride  40 mEq Oral Once  . sucralfate  1 g Oral Q6H  . vancomycin  2,500 mg Intravenous Once  . vancomycin  1,000 mg Intravenous Q12H  . DISCONTD: hydrALAZINE  25 mg Oral Q8H    History  Substance Use Topics  . Smoking status: Never Smoker   . Smokeless tobacco: Never Used  . Alcohol Use: Yes     rare use    Family History  Problem Relation Age of Onset  . Allergies Mother   . Heart disease Mother   . Lung cancer Mother     mesothelioma  . Aneurysm Brother     brain  . Heart disease Father   . Lung cancer Father    Review of Systems  Constitutional: Negative for fever, chills, diaphoresis, activity change, appetite change, fatigue and unexpected weight change.  HENT: Negative for congestion, sore throat, rhinorrhea, sneezing, trouble swallowing and sinus pressure.  Eyes: Negative for photophobia and visual disturbance.  Respiratory: Negative for cough, chest tightness, shortness of breath, wheezing and stridor.  Cardiovascular: Negative for chest pain, palpitations and leg swelling.  Gastrointestinal: positive for nausea, vomiting, abdominal pain, but negative diarrhea, constipation, blood in stool, abdominal distention and anal bleeding.  Genitourinary: Negative for dysuria, hematuria, flank pain and difficulty urinating.  Musculoskeletal: positive for back pain; Negative for myalgias, ,  joint swelling, arthralgias and gait problem.  Skin: Negative for color change, pallor, rash and wound.  Neurological: Negative for dizziness, tremors, weakness and light-headedness.  Hematological: Negative for adenopathy. Does not bruise/bleed easily.  Psychiatric/Behavioral: Negative for behavioral problems, confusion, sleep disturbance, dysphoric mood, decreased concentration and agitation.     OBJECTIVE: Temp:  [97.4 F (36.3 C)-98.4 F (36.9 C)] 97.4 F (36.3 C) (10/28 0707) Pulse Rate:  [97-122] 99  (10/28 1140) Resp:  [20] 20  (10/28 0707) BP: (137-185)/(70-112) 137/70 mmHg (10/28 1304) SpO2:  [95 %-98 %] 98 % (10/28 1140)  General Appearance:    Alert, cooperative, no distress, appears stated age  Head:    Normocephalic, without obvious abnormality, atraumatic  Eyes:    PERRL, conjunctiva/corneas clear, EOM's intact        Throat:   Lips, mucosa, and tongue normal; teeth and gums normal  Neck:   Supple, symmetrical,  trachea midline, no adenopathy;        Back:     Symmetric, no curvature, ROM normal, no CVA tenderness  Lungs:     Clear to auscultation bilaterally, respirations unlabored  Chest wall:    No tenderness or deformity  Heart:    Regular rate and rhythm, S1 and S2 normal, no murmur, rub   or gallop  Abdomen:     Soft, non-tender, bowel sounds active all four quadrants,    no masses, no organomegaly        Extremities:   Extremities normal, atraumatic, no cyanosis or edema  Pulses:   2+ and symmetric all extremities  Skin:   Skin color, texture, turgor normal, no rashes or lesions  Lymph nodes:   Cervical, supraclavicular, and axillary nodes normal  Neurologic:   CNII-XII intact. Normal strength, sensation and reflexes      throughout   LABS: Results for orders placed during the hospital encounter of 10/27/11 (from the past 48 hour(s))  CBC WITH DIFFERENTIAL     Status: Abnormal   Collection Time   10/30/11  6:01 AM      Component Value Range Comment     WBC 15.2 (*) 4.0 - 10.5 K/uL    RBC 4.89  4.22 - 5.81 MIL/uL    Hemoglobin 12.8 (*) 13.0 - 17.0 g/dL    HCT 86.5 (*) 78.4 - 52.0 %    MCV 79.6  78.0 - 100.0 fL    MCH 26.2  26.0 - 34.0 pg    MCHC 32.9  30.0 - 36.0 g/dL    RDW 69.6 (*) 29.5 - 15.5 %    Platelets 365  150 - 400 K/uL    Neutrophils Relative 81 (*) 43 - 77 %    Neutro Abs 12.4 (*) 1.7 - 7.7 K/uL    Lymphocytes Relative 10 (*) 12 - 46 %    Lymphs Abs 1.5  0.7 - 4.0 K/uL    Monocytes Relative 8  3 - 12 %    Monocytes Absolute 1.2 (*) 0.1 - 1.0 K/uL    Eosinophils Relative 1  0 - 5 %    Eosinophils Absolute 0.1  0.0 - 0.7 K/uL    Basophils Relative 0  0 - 1 %    Basophils Absolute 0.0  0.0 - 0.1 K/uL   BASIC METABOLIC PANEL     Status: Abnormal   Collection Time   10/30/11  6:01 AM      Component Value Range Comment   Sodium 135  135 - 145 mEq/L    Potassium 2.7 (*) 3.5 - 5.1 mEq/L    Chloride 101  96 - 112 mEq/L    CO2 22  19 - 32 mEq/L    Glucose, Bld 144 (*) 70 - 99 mg/dL    BUN 8  6 - 23 mg/dL    Creatinine, Ser 2.84  0.50 - 1.35 mg/dL    Calcium 9.2  8.4 - 13.2 mg/dL    GFR calc non Af Amer >90  >90 mL/min    GFR calc Af Amer >90  >90 mL/min   MAGNESIUM     Status: Normal   Collection Time   10/30/11  6:01 AM      Component Value Range Comment   Magnesium 1.7  1.5 - 2.5 mg/dL   SEDIMENTATION RATE     Status: Abnormal   Collection Time   10/30/11  6:35 PM      Component Value  Range Comment   Sed Rate 50 (*) 0 - 16 mm/hr   C-REACTIVE PROTEIN     Status: Abnormal   Collection Time   10/30/11  6:35 PM      Component Value Range Comment   CRP 2.7 (*) <0.60 mg/dL   BASIC METABOLIC PANEL     Status: Abnormal   Collection Time   10/31/11  5:10 AM      Component Value Range Comment   Sodium 138  135 - 145 mEq/L    Potassium 3.0 (*) 3.5 - 5.1 mEq/L    Chloride 104  96 - 112 mEq/L    CO2 23  19 - 32 mEq/L    Glucose, Bld 110 (*) 70 - 99 mg/dL    BUN 11  6 - 23 mg/dL    Creatinine, Ser 1.32  0.50 - 1.35  mg/dL    Calcium 9.0  8.4 - 44.0 mg/dL    GFR calc non Af Amer >90  >90 mL/min    GFR calc Af Amer >90  >90 mL/min   CBC WITH DIFFERENTIAL     Status: Abnormal   Collection Time   10/31/11  5:10 AM      Component Value Range Comment   WBC 14.6 (*) 4.0 - 10.5 K/uL    RBC 4.92  4.22 - 5.81 MIL/uL    Hemoglobin 12.9 (*) 13.0 - 17.0 g/dL    HCT 10.2  72.5 - 36.6 %    MCV 79.3  78.0 - 100.0 fL    MCH 26.2  26.0 - 34.0 pg    MCHC 33.1  30.0 - 36.0 g/dL    RDW 44.0 (*) 34.7 - 15.5 %    Platelets 341  150 - 400 K/uL    Neutrophils Relative 83 (*) 43 - 77 %    Neutro Abs 12.1 (*) 1.7 - 7.7 K/uL    Lymphocytes Relative 8 (*) 12 - 46 %    Lymphs Abs 1.1  0.7 - 4.0 K/uL    Monocytes Relative 9  3 - 12 %    Monocytes Absolute 1.3 (*) 0.1 - 1.0 K/uL    Eosinophils Relative 1  0 - 5 %    Eosinophils Absolute 0.1  0.0 - 0.7 K/uL    Basophils Relative 0  0 - 1 %    Basophils Absolute 0.0  0.0 - 0.1 K/uL     MICRO: 10/24 Blood cx x 2 : NGTD  IMAGING: Mr Lumbar Spine W Wo Contrast  10/30/2011  *RADIOLOGY REPORT*  Clinical Data: Increased back pain with nausea and vomiting. History of T11-T12 diskitis/osteomyelitis status post 6-week course of antibiotics. Left T12 costotransversectomy with paraspinal biopsy 08/08/2010.  MRI LUMBAR SPINE WITHOUT AND WITH CONTRAST  Technique:  Multiplanar and multiecho pulse sequences of the lumbar spine were obtained without and with intravenous contrast.  Contrast: 20mL MULTIHANCE GADOBENATE DIMEGLUMINE 529 MG/ML IV SOLN  Comparison: Thoracic MRI 07/18/2011, abdominal pelvic CT 06/30/2011 and lumbar MRI 06/27/2011.  Findings: Current examination includes the lower thoracic and lumbar spine, extending from mid T8 through the mid sacrum on the sagittal images.  The changes of diskitis/osteomyelitis at T11-T12 have significantly worsened compared with the prior examination.  There is endplate destruction with mild loss of disc height.  There is extensive marrow edema  throughout the T11 and T12 vertebral bodies and its posterior elements.  There is irregular enhancing fluid within the T11-T12 disc. There is a peripherally enhancing fluid collection posteriorly in  the surgical bed on the left, measuring 2.3 x 2.0 cm transverse.  There is extensive enhancing paraspinal inflammatory change anteriorly around the T11 and T12 vertebral bodies extending asymmetrically to the left where there is a small fluid collection. There is a small amount of epidural inflammation, but no epidural abscess or significant mass effect on the thecal sac is seen.  The additional disc spaces appear normal.  There is no evidence of acute fracture.  No abnormal intradural enhancement is seen.  The conus medullaris extends to the L1-L2 disc space level.  There is a small right pleural effusion.  Bilateral renal cysts are partially imaged.  Mild spondylosis in the lower lumbar spine appears unchanged.  IMPRESSION: Significant worsening of diskitis/osteomyelitis at T11-T12 with extensive paraspinal inflammatory change and paraspinal abscesses on the left.  No significant epidural abscess or mass effect on the thecal sac identified.  These results were called by telephone on 10/30/2011 at 1415 hours to Dr. Gonzella Lex, who verbally acknowledged these results.   Original Report Authenticated By: Gerrianne Scale, M.D.     HISTORICAL MICRO/IMAGING 08/08/2011 tissue cx: MSSA (oxa S)  Assessment/Plan:  61yo Male with h/o T12 paraspinal/osteo with MSSA presents with nausea, vomiting thought to be due to GI source, but repeat imaging after completion of 6 wk of IV antibiotics reveal worsening destruction to endplate T11-T12 plus small peripheral enhancing fluid collection 2.3 x 2 cm. With elevated ESR 50 and CRP 2.7, concerning for ongoing/untreated infection.  - will re-treat with vancomycin, trough goal 15-20. To cover MSSA (patient had diffuse erythamatous rash while on cefazolin). - can place picc line since  blood cultures on admit were negative  Saraya Tirey B. Drue Second MD MPH [facility] [phone]

## 2011-10-31 NOTE — Progress Notes (Signed)
Spoke with patient regarding his home cpap.  Pt stated he is fine and does not need any help with his machine.  Per pt request, sterile water was added to humidity chamber to max fill line.  Pt advised that RT available all night and encouraged him to call should he need assistance.  RN aware.  No frays on cord or defects noted with home unit.  Service response/biomed call placed to have his machine inspected.

## 2011-10-31 NOTE — Progress Notes (Signed)
Patient continues to decline the use of CPAP due to more noise than his home machine. He agrees to have wife bring in his home equipment in the morning so he may continue compliance for his sleep apnea disorder. RN aware.

## 2011-10-31 NOTE — Progress Notes (Signed)
3 Days Post-Op  Subjective: Alert up in chair, feeling some better.  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  Intake/Output from previous day: 10/27 0701 - 10/28 0700 In: 2700 [I.V.:2300; IV Piggyback:400] Out: -  Intake/Output this shift:    General appearance: alert, cooperative and no distress GI: Pain is more right flank/back.  tolerating some clears after Vanco restarted today.  Lab Results:   Basename 10/31/11 0510 10/30/11 0601  WBC 14.6* 15.2*  HGB 12.9* 12.8*  HCT 39.0 38.9*  PLT 341 365    BMET  Basename 10/31/11 0510 10/30/11 0601  NA 138 135  K 3.0* 2.7*  CL 104 101  CO2 23 22  GLUCOSE 110* 144*  BUN 11 8  CREATININE 0.80 0.82  CALCIUM 9.0 9.2   PT/INR No results found for this basename: LABPROT:2,INR:2 in the last 72 hours   Lab 10/29/11 0552 10/27/11 1305  AST 16 18  ALT 9 12  ALKPHOS 115 139*  BILITOT 0.3 0.4  PROT 7.7 8.4*  ALBUMIN 3.0* 3.3*     Lipase     Component Value Date/Time   LIPASE 30 10/27/2011 1305     Studies/Results: Mr Lumbar Spine W Wo Contrast  10/30/2011  *RADIOLOGY REPORT*  Clinical Data: Increased back pain with nausea and vomiting. History of T11-T12 diskitis/osteomyelitis status post 6-week course of antibiotics. Left T12 costotransversectomy with paraspinal biopsy 08/08/2010.  MRI LUMBAR SPINE WITHOUT AND WITH CONTRAST  Technique:  Multiplanar and multiecho pulse sequences of the lumbar spine were obtained without and with intravenous contrast.  Contrast: 20mL MULTIHANCE GADOBENATE  DIMEGLUMINE 529 MG/ML IV SOLN  Comparison: Thoracic MRI 07/18/2011, abdominal pelvic CT 06/30/2011 and lumbar MRI 06/27/2011.  Findings: Current examination includes the lower thoracic and lumbar spine, extending from mid T8 through the mid sacrum on the sagittal images.  The changes of diskitis/osteomyelitis at T11-T12 have significantly worsened compared with the prior examination.  There is endplate destruction with mild loss of disc height.  There is extensive marrow edema throughout the T11 and T12 vertebral bodies and its posterior elements.  There is irregular enhancing fluid within the T11-T12 disc. There is a peripherally enhancing fluid collection posteriorly in the surgical bed on the left, measuring 2.3 x 2.0 cm transverse.  There is extensive enhancing paraspinal inflammatory change anteriorly around the T11 and T12 vertebral bodies extending asymmetrically to the left where there is a small fluid collection. There is a small amount of epidural inflammation, but no epidural abscess or significant mass effect on the thecal sac is seen.  The additional disc spaces appear normal.  There is no evidence of acute fracture.  No abnormal intradural enhancement is seen.  The conus medullaris extends to the L1-L2 disc space level.  There is a small right pleural effusion.  Bilateral renal cysts are partially imaged.  Mild spondylosis in the lower lumbar spine appears unchanged.  IMPRESSION: Significant worsening of diskitis/osteomyelitis at T11-T12 with extensive paraspinal inflammatory change and paraspinal abscesses on the left.  No significant epidural abscess or mass effect on the thecal sac  identified.  These results were called by telephone on 10/30/2011 at 1415 hours to Dr. Gonzella Lex, who verbally acknowledged these results.   Original Report Authenticated By: Gerrianne Scale, M.D.     Medications:    . amLODipine  10 mg Oral Daily  . cloNIDine  0.2 mg Oral TID  . fluticasone  2 spray Each Nare  Daily  . lidocaine  1 patch Transdermal Q24H  . metoCLOPramide (REGLAN) injection  10 mg Intravenous Q6H  . metoprolol succinate  100 mg Oral Daily  . multivitamin with minerals  1 tablet Oral Daily  . pantoprazole (PROTONIX) IV  40 mg Intravenous Q12H  . potassium chloride  10 mEq Intravenous Q1 Hr x 4  . potassium chloride  40 mEq Oral Once  . sucralfate  1 g Oral Q6H  . vancomycin  2,500 mg Intravenous Once  . vancomycin  1,000 mg Intravenous Q12H  . DISCONTD: hydrALAZINE  25 mg Oral Q8H  . DISCONTD: metoprolol succinate  50 mg Oral Daily    Assessment/Plan Intractable nausea and vomiting, weight loss, severe distal esophagitis, with non obstructing fibrous stricture, ulcers at GE junction, Gastritis, duodenitis, and duodenal ulcers. 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  Wife and patient think it corresponds to time off antibiotic's.  Will discuss with Dr. Biagio Quint.  I think getting him thru current issues, upper Gi and paraspinal abscess take precedence, and if symptoms persist we can discuss his cholelithiasis after these resolve.     LOS: 4 days    Lilia Letterman 10/31/2011

## 2011-11-01 DIAGNOSIS — R112 Nausea with vomiting, unspecified: Secondary | ICD-10-CM

## 2011-11-01 DIAGNOSIS — M8618 Other acute osteomyelitis, other site: Secondary | ICD-10-CM

## 2011-11-01 LAB — CBC WITH DIFFERENTIAL/PLATELET
Basophils Relative: 1 % (ref 0–1)
Eosinophils Absolute: 0.4 10*3/uL (ref 0.0–0.7)
Eosinophils Relative: 4 % (ref 0–5)
HCT: 36.9 % — ABNORMAL LOW (ref 39.0–52.0)
Hemoglobin: 12 g/dL — ABNORMAL LOW (ref 13.0–17.0)
Lymphs Abs: 1.4 10*3/uL (ref 0.7–4.0)
MCH: 26 pg (ref 26.0–34.0)
MCHC: 32.5 g/dL (ref 30.0–36.0)
MCV: 80 fL (ref 78.0–100.0)
Monocytes Absolute: 1 10*3/uL (ref 0.1–1.0)
Monocytes Relative: 10 % (ref 3–12)
RBC: 4.61 MIL/uL (ref 4.22–5.81)

## 2011-11-01 LAB — BASIC METABOLIC PANEL
BUN: 18 mg/dL (ref 6–23)
Creatinine, Ser: 1.21 mg/dL (ref 0.50–1.35)
GFR calc Af Amer: 72 mL/min — ABNORMAL LOW (ref >90)
GFR calc non Af Amer: 62 mL/min — ABNORMAL LOW (ref >90)
Glucose, Bld: 103 mg/dL — ABNORMAL HIGH (ref 70–99)
Potassium: 3.3 mEq/L — ABNORMAL LOW (ref 3.5–5.1)

## 2011-11-01 MED ORDER — SODIUM CHLORIDE 0.9 % IJ SOLN
10.0000 mL | INTRAMUSCULAR | Status: DC | PRN
  Administered 2011-11-03: 10 mL

## 2011-11-01 MED ORDER — PANTOPRAZOLE SODIUM 40 MG PO TBEC
40.0000 mg | DELAYED_RELEASE_TABLET | Freq: Two times a day (BID) | ORAL | Status: DC
  Administered 2011-11-01 – 2011-11-03 (×4): 40 mg via ORAL
  Filled 2011-11-01 (×6): qty 1

## 2011-11-01 MED ORDER — POTASSIUM CHLORIDE CRYS ER 20 MEQ PO TBCR
40.0000 meq | EXTENDED_RELEASE_TABLET | Freq: Once | ORAL | Status: AC
  Administered 2011-11-01: 40 meq via ORAL
  Filled 2011-11-01: qty 2

## 2011-11-01 NOTE — Progress Notes (Signed)
[facility] Progress Note  Subjective:  Nausea and vomiting resolved almost immediately with beginning vancomycin for the abscesses, etc.  Has been tolerating some full liquids since yesterday, but is afraid to try real food yet.  No abdominal pain.  Objective:  Vital signs in last 24 hours: Temp:  [97.7 F (36.5 C)-98.3 F (36.8 C)] 98 F (36.7 C) (10/29 1341) Pulse Rate:  [58-77] 69  (10/29 1341) Resp:  [18-20] 18  (10/29 1341) BP: (111-148)/(74-90) 111/74 mmHg (10/29 1341) SpO2:  [95 %-97 %] 96 % (10/29 1341) Last BM Date: 10/26/11 General:   Alert,  Well-developed, in NAD Heart:  Regular rate and rhythm; no murmurs Pulm:  CTAB.  No W/R/R. Abdomen:  Soft, nontender and nondistended. Normal bowel sounds, without guarding, and without rebound.   Extremities:  Without edema. Neurologic:  Alert and  oriented x4;  grossly normal neurologically. Psych:  Alert and cooperative. Normal mood and affect.  Intake/Output this shift: Total I/O In: 240 [P.O.:240] Out: -   Lab Results:  Basename 11/01/11 0530 10/31/11 0510 10/30/11 0601  WBC 9.5 14.6* 15.2*  HGB 12.0* 12.9* 12.8*  HCT 36.9* 39.0 38.9*  PLT 290 341 365   BMET  Basename 11/01/11 0530 10/31/11 0510 10/30/11 0601  NA 135 138 135  K 3.3* 3.0* 2.7*  CL 101 104 101  CO2 23 23 22   GLUCOSE 103* 110* 144*  BUN 18 11 8   CREATININE 1.21 0.80 0.82  CALCIUM 9.1 9.0 9.2    Assessment / Plan: 1) Nausea and vomiting:  Likely central source secondary to his paraspinal abscesses, diskitis, osteo.  Resolution with initiation of antibiotics. 2) Esophagitis/duodenitis/gastric ulcers:  On PPI's.  I believe that these findings are a result of his vomiting rather than the cause of his vomiting.  He is being treated adequately.  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  On abx, ID following.  4) Morbid obesity inspite of 50 lb weight loss.  -Continue PPI therapy. -Abx per ID  and primary service. -Anti-emetics prn. -Will sign off from a GI standpoint.  Call if needed.    LOS: 5 days   ZEHR, JESSICA D.  11/01/2011, 1:52 PM  Pager number [phone]    I have taken an interval history, reviewed the chart and examined the patient. I agree with Otho Darner note, impression and recommendations. GI signing off. GI follow up with Dr. Lina Sar as needed.  Venita Lick. Russella Dar MD Clementeen Graham

## 2011-11-01 NOTE — Progress Notes (Signed)
Spoke with patient regarding his home cpap unit.  Pt stated he wore it last night and everything was fine.  Pt stated his wife already added water to his machine for humidity.  Pt was advised that RT available all night should he need assistance.

## 2011-11-01 NOTE — Care Management Note (Signed)
    Page 1 of 2   11/01/2011     2:47:26 PM   CARE MANAGEMENT NOTE 11/01/2011  Patient:  John Brandt, John Brandt   Account Number:  [id number]  Date Initiated:  11/01/2011  Documentation initiated by:  Colleen Can  Subjective/Objective Assessment:   dx intractable nausea and vomiting, Paraspinal abscess t12, leucocytosis  Request for home IV abx     Action/Plan:   CM spoke with patient. Current plans are for patient to return to his home in [place] where spouse will be caregiver. Pt has Picc line in place. Pt states he has used [facility] for previous home abx   Anticipated DC Date:  11/01/2011   Anticipated DC Plan:  HOME W HOME HEALTH SERVICES  In-house referral  NA      DC Planning Services  CM consult      [facility] Choice  HOME HEALTH   Choice offered to / List presented to:  C-1 Patient   DME arranged  NA      DME agency  NA     HH arranged  HH-1 RN  IV Antibiotics      HH agency  [facility]   Status of service:  In process, will continue to follow Medicare Important Message given?  NO (If response is "NO", the following Medicare IM given date fields will be blank) Date Medicare IM given:   Date Additional Medicare IM given:    Discharge Disposition:    Per UR Regulation:    If discussed at Long Length of Stay Meetings, dates discussed:    Comments:  11/01/2011 Damaris Schooner RN CCM [phone] [facility] will be able to provide [facility] services for Home IV abx. Anticipate d/c tomorrow. CM will follow.

## 2011-11-01 NOTE — Progress Notes (Signed)
Peripherally Inserted Central Catheter/Midline Placement  The IV Nurse has discussed with the patient and/or persons authorized to consent for the patient, the purpose of this procedure and the potential benefits and risks involved with this procedure.  The benefits include less needle sticks, lab draws from the catheter and patient may be discharged home with the catheter.  Risks include, but not limited to, infection, bleeding, blood clot (thrombus formation), and puncture of an artery; nerve damage and irregular heat beat.  Alternatives to this procedure were also discussed.  PICC/Midline Placement Documentation  PICC / Midline Single Lumen 08/09/11 PICC Right Brachial (Active)     PICC / Midline Single Lumen 11/01/11 PICC Right Brachial (Active)       Stacie Glaze Horton 11/01/2011, 12:35 PM

## 2011-11-01 NOTE — Progress Notes (Addendum)
Nutrition Brief Note  INTERVENTION:  - Recommend MD initiate enteral nutrition as pt with severe malnutrition of chronic illness and minimal intake since admission. Recommend post-pyloric TF to promote GI tolerance and use of elemental formula. (Recommend TF of Vital 1.2 AF start at 40ml/hr increase by 10ml q8hr to goal of 65ml/hr. This will provide 2016 calories, 126g protein, free water - meeting 100% estimated calorie needs, 126% estimated protein needs. If IVF d/c, recommend water flushes q6hr.)  - Recommend MD d/c Carafate as pt reports the taste of the medication makes him vomit.   - Pt with low potassium which is improving, phosphorus and magnesium WNL. Recommend close monitoring of these labs when/if TF initiated and advanced.   Potassium  Date/Time Value Range Status  11/01/2011  5:30 AM 3.3* 3.5 - 5.1 mEq/L Final  10/31/2011  5:10 AM 3.0* 3.5 - 5.1 mEq/L Final  10/30/2011  6:01 AM 2.7* 3.5 - 5.1 mEq/L Final     CRITICAL RESULT CALLED TO, READ BACK BY AND VERIFIED WITH:     CSHELTON RN AT 0634 ON [id number] BY [facility]    Phosphorus  Date/Time Value Range Status  10/31/2011  5:10 AM 3.1  2.3 - 4.6 mg/dL Final  1/61/0960  4:54 PM 1.8* 2.3 - 4.6 mg/dL Final  0/98/1191  4:78 PM 3.5  2.3 - 4.6 mg/dL Final    Magnesium  Date/Time Value Range Status  10/30/2011  6:01 AM 1.7  1.5 - 2.5 mg/dL Final  29/56/2130  8:65 PM 1.8  1.5 - 2.5 mg/dL Final  78/04/6960  9:52 AM 1.6  1.5 - 2.5 mg/dL Final   Levon Hedger MS, RD, LDN [phone] Pager [phone] After Hours Pager

## 2011-11-01 NOTE — Progress Notes (Signed)
ANTIBIOTIC CONSULT NOTE  Pharmacy Consult for Vancomycin Indication: T11-T12 osteomyelitis  Allergies  Allergen Reactions  . Fish-Derived Products Nausea And Vomiting  . Cefazolin Rash    rash  . Iodine Rash  . Penicillins Rash    Patient Measurements: Height: 6\' 1"  (185.4 cm) Weight: 252 lb 1.6 oz (114.352 kg) IBW/kg (Calculated) : 79.9   Vital Signs: Temp: 97.7 F (36.5 C) (10/29 0544) Temp src: Oral (10/29 0544) BP: 132/83 mmHg (10/29 0544) Pulse Rate: 58  (10/29 0544)  Labs:  Basename 11/01/11 0530 10/31/11 0510 10/30/11 0601  WBC 9.5 14.6* 15.2*  HGB 12.0* 12.9* 12.8*  PLT 290 341 365  LABCREA -- -- --  CREATININE 1.21 0.80 0.82   Estimated Creatinine Clearance: 83.9 ml/min (by C-G formula based on Cr of 1.21). No results found for this basename: VANCOTROUGH:2,VANCOPEAK:2,VANCORANDOM:2,GENTTROUGH:2,GENTPEAK:2,GENTRANDOM:2,TOBRATROUGH:2,TOBRAPEAK:2,TOBRARND:2,AMIKACINPEAK:2,AMIKACINTROU:2,AMIKACIN:2, in the last 72 hours   Microbiology: Recent Results (from the past 720 hour(s))  CULTURE, BLOOD (ROUTINE X 2)     Status: Normal (Preliminary result)   Collection Time   10/27/11  3:30 PM      Component Value Range Status Comment   Specimen Description BLOOD RIGHT HAND   Final    Special Requests BOTTLES DRAWN AEROBIC AND ANAEROBIC 5CC   Final    Culture  Setup Time 10/27/2011 22:26   Final    Culture     Final    Value:        BLOOD CULTURE RECEIVED NO GROWTH TO DATE CULTURE WILL BE HELD FOR 5 DAYS BEFORE ISSUING A FINAL NEGATIVE REPORT   Report Status PENDING   Incomplete   CULTURE, BLOOD (ROUTINE X 2)     Status: Normal (Preliminary result)   Collection Time   10/27/11  4:05 PM      Component Value Range Status Comment   Specimen Description BLOOD RIGHT ARM   Final    Special Requests BOTTLES DRAWN AEROBIC AND ANAEROBIC 5CC   Final    Culture  Setup Time 10/27/2011 22:26   Final    Culture     Final    Value:        BLOOD CULTURE RECEIVED NO GROWTH TO DATE  CULTURE WILL BE HELD FOR 5 DAYS BEFORE ISSUING A FINAL NEGATIVE REPORT   Report Status PENDING   Incomplete     Medical History: Past Medical History  Diagnosis Date  . Hypertension   . Hyperlipidemia   . Allergic rhinitis   . Hx of cardiac cath   . Sleep apnea     sleep study 07-02-02  . Chronic back pain   . 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 Complication of anesthesia   . PONV (postoperative nausea and vomiting)     Assessment:  60 YOM with severe osteomyelitis and diskitis at T11-T12 with paraspinal abscesses.  Paraspinal abscess was biopsied 08/08/11 and grew MSSA for which the patient was treated with 6 weeks of antibiotics per ID service (started with cefazolin but pt eventually developed rash on this antibiotic and was transitioned to vancomycin to complete therapy).   Vancomycin restarted upon admission for new findings of worsening osteomyelitis.    D#3 Vancomycin 2500 mg IV x 1, then 1000 mg IV q12h.  Yesterday's 0600 dose not charted.  SCr is rising.  Goal of Therapy:  Vancomycin trough level 15-20 mcg/ml  Plan:  Check Vancomycin trough today  at 5pm (not at full steady-state, but need to assess current status in view of rising SCr)  Elie Goody, PharmD, BCPS Pager: [phone] 11/01/2011  10:28 AM

## 2011-11-01 NOTE — Progress Notes (Signed)
[facility]    Date of Admission:  10/27/2011   Total days of antibiotics 3        Day 3 vancomycin           ID: John Brandt is a 61 y.o. male with h/o T12 paraspinal/osteo with MSSA presents with nausea, vomiting thought to be due to GI source, but repeat imaging after completion of 6 wk of IV antibiotics reveal worsening destruction to endplate T11-T12 plus small peripheral enhancing fluid collection 2.3 x 2 cm. With elevated ESR 50 and CRP 2.7, concerning for ongoing/untreated infection.     Principal Problem:  *Intractable nausea and vomiting Active Problems:  Leukocytosis  Hypokalemia  Paraspinal abscess  Dehydration  Metabolic acidosis  Duodenal ulcer  Gastritis  Reflux esophagitis  Cholecystitis chronic, acute    Subjective: He has tolerated taking apple juice and jello. Afebrile. "Janna Arch" in trying food. Had picc line placed in right arm without difficulty. Only ambulated to bathroom and back, feels back pain is ok  Medications:     . amLODipine  10 mg Oral Daily  . cloNIDine  0.2 mg Oral TID  . fluticasone  2 spray Each Nare Daily  . lidocaine  1 patch Transdermal Q24H  . metoCLOPramide (REGLAN) injection  10 mg Intravenous Q6H  . metoprolol succinate  100 mg Oral Daily  . multivitamin with minerals  1 tablet Oral Daily  . pantoprazole  40 mg Oral BID  . potassium chloride  40 mEq Oral Once  . sucralfate  1 g Oral Q6H  . vancomycin  1,000 mg Intravenous Q12H  . DISCONTD: pantoprazole (PROTONIX) IV  40 mg Intravenous Q12H    Objective: Vital signs in last 24 hours: Temp:  [97.7 F (36.5 C)-98.4 F (36.9 C)] 98.4 F (36.9 C) (10/29 1649) Pulse Rate:  [58-77] 70  (10/29 1649) Resp:  [18-20] 18  (10/29 1649) BP: (111-148)/(74-90) 137/78 mmHg (10/29 1649) SpO2:  [95 %-97 %] 95 % (10/29 1649)  Physical Exam  Constitutional: He is oriented to person, place, and time. He appears well-developed and well-nourished. No  distress.  HENT:  Mouth/Throat: Oropharynx is clear and moist. No oropharyngeal exudate.  Cardiovascular: Normal rate, regular rhythm and normal heart sounds. Exam reveals no gallop and no friction rub.  No murmur heard.  Pulmonary/Chest: Effort normal and breath sounds normal. No respiratory distress. He has no wheezes.  Abdominal: Soft. Bowel sounds are normal. He exhibits no distension. There is no tenderness.  Lymphadenopathy:  He has no cervical adenopathy.  Neurological: He is alert and oriented to person, place, and time.  Skin: Skin is warm and dry. No rash noted. No erythema.  Psychiatric: He has a normal mood and affect. His behavior is normal.    Lab Results  [facility] 11/01/11 0530 10/31/11 0510  WBC 9.5 14.6*  HGB 12.0* 12.9*  HCT 36.9* 39.0  NA 135 138  K 3.3* 3.0*  CL 101 104  CO2 23 23  BUN 18 11  CREATININE 1.21 0.80  GLU -- --   Sedimentation Rate  Basename 10/30/11 1835  ESRSEDRATE 50*   C-Reactive Protein  Basename 10/30/11 1835  CRP 2.7*    Microbiology: 10/24 blood cx x 2 NGTD Studies/Results: No results found.   Assessment/Plan: T11-T12 osteomyelitis with irregular enhancement at T11-T12 and also peripherally enhancing fluid collection 2.3 x 2cm.  - radiology is amenable to sampling fluid collection to see if there is a pathogen other than  MSSA. Will hold antibiotics  get specimen for aerobic, anaerobic, fungal culture  [facility], [facility] Cell: [phone] Pager: [phone]  11/01/2011, 4:54 PM

## 2011-11-01 NOTE — Progress Notes (Addendum)
[facility] PROGRESS NOTE  John Brandt ZOX:[id number] DOB: April 29, 1950 DOA: 10/27/2011 PCP: Londell Moh, MD  Brief narrative:  61 Y/O obese male with hx of HTN, recent MSSA paraspinal abscess over T12 treated with 6 weeks of abx ( cefazolin for 5 weeks and and vanco for last 1 week as he developed rash) , OSA with ongoing nausea and vomiting for past few months presents with persistent nausea and vomiting. .   Assessment/  Persistent nausea and vomiting  EGD done shows signs of severe distal esophagitis, duodenitis and 2 small gastric ulcer but as per Gi apprers to be secondary to intractable vomiting rather than cause of it.  bx sent for H pylori negative, shows duodenitis . -continue phenergan and reglan q 6hr prn . Now much improved after abx started  continue PPI  -US abdomen shows cholelithiasis . 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 Repeated on 10/25 shows decreased GB ejection fraction of 20%. GI suggests this is likely causing his symptoms.  Appreciate surgery eval. Recommended getting MRI of the spine to r/o persistent abscess before deciding on cholecystectomy. Given MRI findings plan to hold off on surgery for now until more stable and will follow up as outpatient.   Gastric ulcer, esophagitis and duodenitis As noted above on EGD, related to persistent nausea and vomiting and taking motrin for back pain.  Hpylori negative  continue PPI on discharge   Anion gap metabolic acidosis  associated with nausea and vomiting. Now resolved.   MSSA paraspinal abscess  completed 6 weeks of abx ( cefazolin followed by vanco) in august  . patient still has low back pain. MRI done to evaluate the paraspinal abscess as follow up shows worsening diskitis at T12 with worsening left paraspinal abscess .  -Discussed with Dr Jordan Likes who reviewed the film and considers this a true infection vs a reactive change from previous infection and surgery. recommends getting ID on  board and trying a course of IV abx again and see for response in 2 weeks . Since an IR guided aspiration was unsuccessful previously Dr Jordan Likes recommends it may not be helpful to do so this time as well.ID consulted and recommended starting IV vancomycin possibly for 6 weeks. -Elevated CRP and ESR  -Pain control with prn percocet.  interestingly , both his nausea and leucocytosis has improved after starting IV  Antibiotics. -PICC line placed today and pharmacy dosing his vancomycin. Given some elevated creatine today, will check trough this evening and adjust dose accordingly tomorrow for discharge.  Informed CM to set up home care. ( please follow up with pharmacy in am for home dose)  Hypokalemia  Replenished with kcl and mg,s till low at 3.3 today . Ordered kcl further .  HTN Elevated BP noted. increase dose of toprol. continue amlod. Increased prn hydralazine dose . Appears patient is on clonidine at home and want getting it here. Have resumed it and better controlled now.  Code Status: Full code  Family Communication: patient and wife updated Disposition Plan: PICC line placed today. Adjusting home dose of IV vancomycin. likely can be discharged home on 10/30 if nausea improves and vancomycin dose adjusted with outpatient follow up with ID and surgery.    Consultants:  Juanda Chance ( GI)  [facility]  Neurosurgery ( Dr pool-- discussed over the phone on 10/.27)  ID ( Dr Ilsa Iha)  Procedures:  EGD on 10/25  HIDA scan on 10/25  MRI spine 10/27      Antibiotics:  IV vanco (  10/27)  HPI/Subjective: Feels his nausea to be better. Tolerating diet well.  Objective: Filed Vitals:   10/31/11 2234 11/01/11 0544 11/01/11 1044 11/01/11 1341  BP: 127/90 132/83 148/76 111/74  Pulse: 72 58 77 69  Temp: 98.3 F (36.8 C) 97.7 F (36.5 C)  98 F (36.7 C)  TempSrc: Oral Oral  Oral  Resp: 19 20  18   Height:      Weight:      SpO2: 97% 95%  96%    Intake/Output Summary (Last 24 hours) at  11/01/11 1405 Last data filed at 11/01/11 0900  Gross per 24 hour  Intake    240 ml  Output      0 ml  Net    240 ml   Filed Weights   10/27/11 2000  Weight: 114.352 kg (252 lb 1.6 oz)    Exam:  General: Elderly male in NAD  HEENT: no pallor, Moist oral mucosa  Cardiovascular: NS1&S2, no murmurs  Respiratory: clear b/l, no added sounds  Abdomen: Soft, NT ND, bs+  EXT: warm, no edema , no back tenderness  CNS: AAOX 3    Data Reviewed: Basic Metabolic Panel:  Lab 11/01/11 1610 10/31/11 0510 10/30/11 0601 10/29/11 0552 10/27/11 1910 10/27/11 1605  NA 135 138 135 136 -- 138  K 3.3* 3.0* 2.7* 3.0* -- 3.4*  CL 101 104 101 101 -- 102  CO2 23 23 22 21  -- 18*  GLUCOSE 103* 110* 144* 121* -- 94  BUN 18 11 8 8  -- 13  CREATININE 1.21 0.80 0.82 0.84 -- 1.10  CALCIUM 9.1 9.0 9.2 9.2 -- 9.4  MG -- -- 1.7 -- 1.8 --  PHOS -- 3.1 -- -- -- --   Liver Function Tests:  Lab 10/29/11 0552 10/27/11 1305  AST 16 18  ALT 9 12  ALKPHOS 115 139*  BILITOT 0.3 0.4  PROT 7.7 8.4*  ALBUMIN 3.0* 3.3*    Lab 10/27/11 1305  LIPASE 30  AMYLASE --   No results found for this basename: AMMONIA:5 in the last 168 hours CBC:  Lab 11/01/11 0530 10/31/11 0510 10/30/11 0601 10/29/11 0552 10/27/11 1315  WBC 9.5 14.6* 15.2* 16.7* 15.6*  NEUTROABS 6.6 12.1* 12.4* -- 13.5*  HGB 12.0* 12.9* 12.8* 12.5* 13.1  HCT 36.9* 39.0 38.9* 38.7* 38.8*  MCV 80.0 79.3 79.6 79.6 79.2  PLT 290 341 365 332 359   Cardiac Enzymes: No results found for this basename: CKTOTAL:5,CKMB:5,CKMBINDEX:5,TROPONINI:5 in the last 168 hours BNP (last 3 results) No results found for this basename: PROBNP:3 in the last 8760 hours CBG:  Lab 10/28/11 0746  GLUCAP 97    Recent Results (from the past 240 hour(s))  CULTURE, BLOOD (ROUTINE X 2)     Status: Normal (Preliminary result)   Collection Time   10/27/11  3:30 PM      Component Value Range Status Comment   Specimen Description BLOOD RIGHT HAND   Final    Special  Requests BOTTLES DRAWN AEROBIC AND ANAEROBIC 5CC   Final    Culture  Setup Time 10/27/2011 22:26   Final    Culture     Final    Value:        BLOOD CULTURE RECEIVED NO GROWTH TO DATE CULTURE WILL BE HELD FOR 5 DAYS BEFORE ISSUING A FINAL NEGATIVE REPORT   Report Status PENDING   Incomplete   CULTURE, BLOOD (ROUTINE X 2)     Status: Normal (Preliminary result)   Collection Time  10/27/11  4:05 PM      Component Value Range Status Comment   Specimen Description BLOOD RIGHT ARM   Final    Special Requests BOTTLES DRAWN AEROBIC AND ANAEROBIC 5CC   Final    Culture  Setup Time 10/27/2011 22:26   Final    Culture     Final    Value:        BLOOD CULTURE RECEIVED NO GROWTH TO DATE CULTURE WILL BE HELD FOR 5 DAYS BEFORE ISSUING A FINAL NEGATIVE REPORT   Report Status PENDING   Incomplete      Studies: No results found.  Scheduled Meds:   . amLODipine  10 mg Oral Daily  . cloNIDine  0.2 mg Oral TID  . fluticasone  2 spray Each Nare Daily  . lidocaine  1 patch Transdermal Q24H  . metoCLOPramide (REGLAN) injection  10 mg Intravenous Q6H  . metoprolol succinate  100 mg Oral Daily  . multivitamin with minerals  1 tablet Oral Daily  . pantoprazole (PROTONIX) IV  40 mg Intravenous Q12H  . potassium chloride  40 mEq Oral Once  . sucralfate  1 g Oral Q6H  . vancomycin  1,000 mg Intravenous Q12H   Continuous Infusions:      Time spent:25 minutes    John Brandt  [facility] Pager [phone] If 8PM-8AM, please contact night-coverage at [email or web address], password [facility] 11/01/2011, 2:05 PM  LOS: 5 days

## 2011-11-02 ENCOUNTER — Inpatient Hospital Stay: Payer: BC Managed Care – PPO

## 2011-11-02 ENCOUNTER — Inpatient Hospital Stay (HOSPITAL_COMMUNITY): Payer: BC Managed Care – PPO

## 2011-11-02 ENCOUNTER — Encounter (HOSPITAL_COMMUNITY): Payer: Self-pay | Admitting: Emergency Medicine

## 2011-11-02 DIAGNOSIS — N179 Acute kidney failure, unspecified: Secondary | ICD-10-CM

## 2011-11-02 DIAGNOSIS — M519 Unspecified thoracic, thoracolumbar and lumbosacral intervertebral disc disorder: Secondary | ICD-10-CM

## 2011-11-02 LAB — CULTURE, BLOOD (ROUTINE X 2): Culture: NO GROWTH

## 2011-11-02 LAB — APTT: aPTT: 38 seconds — ABNORMAL HIGH (ref 24–37)

## 2011-11-02 LAB — PROTIME-INR: INR: 1.19 (ref 0.00–1.49)

## 2011-11-02 MED ORDER — FENTANYL CITRATE 0.05 MG/ML IJ SOLN
INTRAMUSCULAR | Status: AC | PRN
  Administered 2011-11-02 (×4): 50 ug via INTRAVENOUS

## 2011-11-02 MED ORDER — MIDAZOLAM HCL 2 MG/2ML IJ SOLN
INTRAMUSCULAR | Status: AC | PRN
  Administered 2011-11-02 (×4): 1 mg via INTRAVENOUS

## 2011-11-02 MED ORDER — OXYCODONE-ACETAMINOPHEN 5-325 MG PO TABS
1.0000 | ORAL_TABLET | Freq: Four times a day (QID) | ORAL | Status: DC | PRN
  Administered 2011-11-02 – 2011-11-03 (×2): 2 via ORAL
  Filled 2011-11-02 (×2): qty 2

## 2011-11-02 MED ORDER — VANCOMYCIN HCL IN DEXTROSE 1-5 GM/200ML-% IV SOLN
1000.0000 mg | Freq: Two times a day (BID) | INTRAVENOUS | Status: DC
  Administered 2011-11-02 – 2011-11-03 (×2): 1000 mg via INTRAVENOUS
  Filled 2011-11-02 (×3): qty 200

## 2011-11-02 NOTE — Progress Notes (Signed)
Subjective: Nausea and vomiting improved, tolerating a diet. No other specific concerns.  Objective: Vital signs in last 24 hours: Filed Vitals:   11/01/11 1341 11/01/11 1649 11/01/11 2150 11/02/11 0613  BP: 111/74 137/78 113/76 115/76  Pulse: 69 70 67 67  Temp: 98 F (36.7 C) 98.4 F (36.9 C) 98 F (36.7 C) 97.6 F (36.4 C)  TempSrc: Oral Oral Oral Oral  Resp: 18 18 18 18   Height:      Weight:      SpO2: 96% 95% 96% 92%   Weight change:   Intake/Output Summary (Last 24 hours) at 11/02/11 0849 Last data filed at 11/02/11 0643  Gross per 24 hour  Intake    994 ml  Output      0 ml  Net    994 ml    Physical Exam: General: Awake, Oriented, No acute distress. HEENT: EOMI. Neck: Supple CV: S1 and S2 Lungs: Clear to ascultation bilaterally Abdomen: Soft, Nontender, Nondistended, +bowel sounds. Ext: Good pulses. Trace edema.  Lab Results: Basic Metabolic Panel:  Lab 11/01/11 1610 10/31/11 0510 10/30/11 0601 10/29/11 0552 10/27/11 1910 10/27/11 1605  NA 135 138 135 136 -- 138  K 3.3* 3.0* 2.7* 3.0* -- 3.4*  CL 101 104 101 101 -- 102  CO2 23 23 22 21  -- 18*  GLUCOSE 103* 110* 144* 121* -- 94  BUN 18 11 8 8  -- 13  CREATININE 1.21 0.80 0.82 0.84 -- 1.10  CALCIUM 9.1 9.0 9.2 9.2 -- 9.4  MG -- -- 1.7 -- 1.8 --  PHOS -- 3.1 -- -- -- --   Liver Function Tests:  Lab 10/29/11 0552 10/27/11 1305  AST 16 18  ALT 9 12  ALKPHOS 115 139*  BILITOT 0.3 0.4  PROT 7.7 8.4*  ALBUMIN 3.0* 3.3*    Lab 10/27/11 1305  LIPASE 30  AMYLASE --   No results found for this basename: AMMONIA:5 in the last 168 hours CBC:  Lab 11/01/11 0530 10/31/11 0510 10/30/11 0601 10/29/11 0552 10/27/11 1315  WBC 9.5 14.6* 15.2* 16.7* 15.6*  NEUTROABS 6.6 12.1* 12.4* -- 13.5*  HGB 12.0* 12.9* 12.8* 12.5* 13.1  HCT 36.9* 39.0 38.9* 38.7* 38.8*  MCV 80.0 79.3 79.6 79.6 79.2  PLT 290 341 365 332 359   Cardiac Enzymes: No results found for this basename:  CKTOTAL:5,CKMB:5,CKMBINDEX:5,TROPONINI:5 in the last 168 hours BNP (last 3 results) No results found for this basename: PROBNP:3 in the last 8760 hours CBG:  Lab 10/28/11 0746  GLUCAP 97   No results found for this basename: HGBA1C:5 in the last 72 hours Other Labs: No components found with this basename: POCBNP:3 No results found for this basename: DDIMER:2 in the last 168 hours No results found for this basename: CHOL:2,HDL:2,LDLCALC:2,TRIG:2,CHOLHDL:2,LDLDIRECT:2 in the last 168 hours No results found for this basename: TSH,T4TOTAL,FREET3,T3FREE,FREET4,THYROIDAB in the last 168 hours No results found for this basename: VITAMINB12:2,FOLATE:2,FERRITIN:2,TIBC:2,IRON:2,RETICCTPCT:2 in the last 168 hours  Micro Results: Recent Results (from the past 240 hour(s))  CULTURE, BLOOD (ROUTINE X 2)     Status: Normal   Collection Time   10/27/11  3:30 PM      Component Value Range Status Comment   Specimen Description BLOOD RIGHT HAND   Final    Special Requests BOTTLES DRAWN AEROBIC AND ANAEROBIC 5CC   Final    Culture  Setup Time 10/27/2011 22:26   Final    Culture NO GROWTH 5 DAYS   Final    Report Status 11/02/2011 FINAL  Final   CULTURE, BLOOD (ROUTINE X 2)     Status: Normal   Collection Time   10/27/11  4:05 PM      Component Value Range Status Comment   Specimen Description BLOOD RIGHT ARM   Final    Special Requests BOTTLES DRAWN AEROBIC AND ANAEROBIC 5CC   Final    Culture  Setup Time 10/27/2011 22:26   Final    Culture NO GROWTH 5 DAYS   Final    Report Status 11/02/2011 FINAL   Final     Studies/Results: No results found.  Medications: I have reviewed the patient's current medications. Scheduled Meds:   . amLODipine  10 mg Oral Daily  . cloNIDine  0.2 mg Oral TID  . fluticasone  2 spray Each Nare Daily  . lidocaine  1 patch Transdermal Q24H  . metoCLOPramide (REGLAN) injection  10 mg Intravenous Q6H  . metoprolol succinate  100 mg Oral Daily  . multivitamin with  minerals  1 tablet Oral Daily  . pantoprazole  40 mg Oral BID  . potassium chloride  40 mEq Oral Once  . sucralfate  1 g Oral Q6H  . DISCONTD: pantoprazole (PROTONIX) IV  40 mg Intravenous Q12H  . DISCONTD: vancomycin  1,000 mg Intravenous Q12H   Continuous Infusions:  PRN Meds:.feeding supplement, hydrALAZINE, ondansetron (ZOFRAN) IV, ondansetron, oxyCODONE-acetaminophen, promethazine, senna-docusate, sodium chloride  Assessment/Plan: Nausea and vomiting  EGD done shows signs of severe distal esophagitis, duodenitis and 2 small gastric ulcer, but as per GI apprears to be secondary to intractable vomiting rather than cause of it. Bx sent for H pylori negative, shows duodenitis. Continue phenergan and reglan q 6hr prn . Now much improved after IV vancomycin was started. Continue PPI. US abdomen shows cholelithiasis. 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 Repeated on 10/25 shows decreased GB ejection fraction of 20%. General surgery, given improvement in his symptoms recommended elective cholecystectomy in the near future once other issues are resolved to see if gallstones are really symptomatic.   Gastric ulcer, esophagitis and duodenitis  As noted above on EGD, related to persistent nausea and vomiting and taking motrin for back pain. Hpylori negative. Continue PPI on discharge.  Anion gap metabolic acidosis  Associated with nausea and vomiting. Now resolved.   MSSA paraspinal abscess  Completed 6 weeks of abx ( cefazolin followed by vanco) in August. Patient still has low back pain. MRI done to evaluate the paraspinal abscess as follow up shows worsening diskitis at T12 with worsening left paraspinal abscess. ID following. Patient had  fluro guided paraspinal abscess aspirated. Dr. Gonzella Lex discussed with Dr Jordan Likes who reviewed the film and considers this a true infection vs a reactive change from previous infection and surgery. ID consulted and recommended starting IV vancomycin possibly for  6 weeks. Elevated CRP and ESR. Pain control with prn percocet. PICC line placed on 11/02/2011 and pharmacy dosing his vancomycin. Informed CM to set up home care.  Hypokalemia  Replace as needed.  HTN Elevated BP noted. increase dose of toprol. continue amlod. Increased prn hydralazine dose. Appears patient is on clonidine at home and was not getting it here. Have resumed it and better controlled now.   Depression Denies any suicidal thoughts or ideation. Wife noted that given his complicated medical course over the last few months patient has been feeling depressed and requested psychiatry consultation.  Psychiatry consultation obtained.  Code Status: Full code  Family Communication: patient and wife updated  Disposition Plan: PICC line placed  today. DC tomorrow after discussion with ID, and surgery.   Consultants:  Juanda Chance ( GI)  [facility]  Neurosurgery ( Dr pool-- discussed over the phone on 10/.27)  ID ( Dr Ilsa Iha)  Psychiatry  Procedures:  EGD on 10/25  HIDA scan on 10/25  MRI spine 10/27   Antibiotics:  IV vanco ( 10/27)   LOS: 6 days  Fran Neiswonger A, MD 11/02/2011, 8:49 AM

## 2011-11-02 NOTE — Progress Notes (Signed)
Patient ID: John Brandt, male   DOB: 1950/08/29, 61 y.o.   MRN: [id number] Request received for aspiration of T11-12 disc space secondary to persistent diskitis/osteomyelitis with associated paraspinal fluid collection. Pt with hx of diskitis/osteomyelitis/paraspinal abscess with MSSA since 08/2011 and treated with IV antibiotics. Recent imaging reveals evidence of persistent infection at site.  Additional PMH as below. Exam: pt awake/alert; chest- few rt basilar crackles, left clear; heart- RRR; abd- soft,+BS,NT.    Filed Vitals:   11/01/11 1341 11/01/11 1649 11/01/11 2150 11/02/11 0613  BP: 111/74 137/78 113/76 115/76  Pulse: 69 70 67 67  Temp: 98 F (36.7 C) 98.4 F (36.9 C) 98 F (36.7 C) 97.6 F (36.4 C)  TempSrc: Oral Oral Oral Oral  Resp: 18 18 18 18   Height:      Weight:      SpO2: 96% 95% 96% 92%   Past Medical History  Diagnosis Date  . Hypertension   . Hyperlipidemia   . Allergic rhinitis   . Hx of cardiac cath   . Sleep apnea     sleep study 07-02-02  . Chronic back pain   . 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 Complication of anesthesia   . PONV (postoperative nausea and vomiting)    Past Surgical History  Procedure Date  . Vasectomy   . Dental implants   . Cardiac catheterization     more than 15 yrs ago  . Back surgery   . Open biopsy on back 08/08/2011  . Esophagogastroduodenoscopy 10/28/2011    Procedure: ESOPHAGOGASTRODUODENOSCOPY (EGD);  Surgeon: Hart Carwin, MD;  Location: [facility];  Service: Endoscopy;  Laterality: N/A;   Dg Chest 2 View  10/27/2011  *RADIOLOGY REPORT*  Clinical Data: Leukocytosis and weakness.  Question pneumonia.  CHEST - 2 VIEW  Comparison: One-view chest 10/04/2011. Two-view chest 08/02/2011. Two-view chest 09/18/2006.  Findings: Cardiac enlargement is stable.  There is no edema or effusion to suggest failure.  A granuloma in the lingula is stable.  Right lateral pleural thickening is stable.  A superior endplate fracture at L1 and inferior endplate fracture of T12 are new since July.  IMPRESSION:  1.  Stable cardiomegaly without failure. 2.  New superior endplate fracture at L1 and inferior endplate fracture at T12.  There is focal kyphosis at this level.   Original Report Authenticated By: Jamesetta Orleans. MATTERN, M.D.    Mr Lumbar Spine W Wo Contrast  10/30/2011  *RADIOLOGY REPORT*  Clinical Data: Increased back pain with nausea and vomiting. History of T11-T12 diskitis/osteomyelitis status post 6-week course of antibiotics. Left T12 costotransversectomy with paraspinal biopsy 08/08/2010.  MRI LUMBAR SPINE WITHOUT AND WITH CONTRAST  Technique:  Multiplanar and multiecho pulse sequences of the lumbar spine were obtained without and with intravenous contrast.  Contrast: 20mL MULTIHANCE GADOBENATE DIMEGLUMINE 529 MG/ML IV SOLN  Comparison: Thoracic MRI 07/18/2011, abdominal pelvic CT 06/30/2011 and lumbar MRI 06/27/2011.  Findings: Current examination includes the lower thoracic and lumbar spine, extending from mid T8 through the mid sacrum on the sagittal images.  The changes of diskitis/osteomyelitis at T11-T12 have significantly worsened compared with the prior examination.  There is endplate destruction with mild loss of disc height.  There is extensive marrow edema throughout the T11 and T12 vertebral bodies and its posterior elements.  There is irregular enhancing fluid within the T11-T12 disc. There is a  peripherally enhancing fluid collection posteriorly in the surgical bed on the left, measuring 2.3 x 2.0 cm transverse.  There is extensive enhancing paraspinal inflammatory change anteriorly around the T11 and T12 vertebral bodies extending asymmetrically to the left where there is a small fluid collection. There is a small amount of epidural inflammation, but no epidural abscess or significant mass effect on the thecal sac is seen.  The additional  disc spaces appear normal.  There is no evidence of acute fracture.  No abnormal intradural enhancement is seen.  The conus medullaris extends to the L1-L2 disc space level.  There is a small right pleural effusion.  Bilateral renal cysts are partially imaged.  Mild spondylosis in the lower lumbar spine appears unchanged.  IMPRESSION: Significant worsening of diskitis/osteomyelitis at T11-T12 with extensive paraspinal inflammatory change and paraspinal abscesses on the left.  No significant epidural abscess or mass effect on the thecal sac identified.  These results were called by telephone on 10/30/2011 at 1415 hours to Dr. Gonzella Lex, who verbally acknowledged these results.   Original Report Authenticated By: Gerrianne Scale, M.D.    Nm Hepatobiliary  10/05/2011  *RADIOLOGY REPORT*  Clinical Data: Intractable nausea and vomiting, known cholelithiasis  NUCLEAR MEDICINE HEPATOHBILIARY INCLUDE GB  Radiopharmaceutical:  5.3 mCi technetium 39m Choletec  Comparison: CT abdomen pelvis of 06/30/2011  Findings: The patient was injected with 5.3 mCi of technetium 28m Choletec intravenously and imaging over the upper abdomen was performed.  Over the first 50 minutes, the radionuclide is excreted by the kidney and there is visualization of the common bile duct and small bowel.  However, the gallbladder fills on a delayed basis at approximately 1 hour after beginning the study.  This is nonspecific but can be seen with chronic cholecystitis.  However this visualization of the gallbladder does indicate cystic duct patency.  IMPRESSION: Delayed visualization of the gallbladder may indicate chronic cholecystitis, but the cystic duct is patent.   Original Report Authenticated By: Juline Patch, M.D.    US Abdomen Complete  10/27/2011  *RADIOLOGY REPORT*  Clinical Data:  Abdominal pain.  Vomiting.  History of gallstones.  COMPLETE ABDOMINAL ULTRASOUND  Comparison:  Plain films of 10/04/2011.  CT of 06/30/2011.  No prior  ultrasound.  Findings:  Gallbladder:  Gallbladder stones at the 1.3 cm.  No wall thickening or pericholecystic fluid. Sonographic Murphy's sign was not elicited.  Common bile duct: Normal, 2 mm.  Liver: Increased echogenicity.  IVC: Negative  Pancreas:  Poorly visualized due to overlying bowel gas.  Spleen:  Normal in size and echogenicity.  Right Kidney:  12.2 cm.  Right renal upper pole cysts measuring 1.4 and 1.7 cm respectively. No hydronephrosis.  Left Kidney:  14.9 cm. No hydronephrosis.  Dominant cyst measuring 8.9 cm.  Abdominal aorta:  Poorly visualized due to overlying bowel gas.  No aneurysm or ascites identified.  IMPRESSION: No acute process or explanation for vomiting or abdominal pain.  Cholelithiasis without cholecystitis.  Hepatic steatosis.   Original Report Authenticated By: Consuello Bossier, M.D.    Nm Hepato W/eject Fract  10/28/2011  *RADIOLOGY REPORT*  Clinical Data:  Abdominal pain and nausea vomiting. Cholelithiasis.  NUCLEAR MEDICINE HEPATOBILIARY IMAGING WITH GALLBLADDER EF  Technique:  Sequential images of the abdomen were obtained out to 60 minutes following intravenous administration of radiopharmaceutical. After oral ingestion of Ensure, gallbladder ejection fraction was determined.  Radiopharmaceutical:  5.5 mCi Tc-81m Choletec  Comparison: None.  Findings: Prompt radiopharmaceutical uptake by the liver is  seen. The liver is normal in appearance.  Prompt biliary excretion activity is demonstrated.  Gallbladder activity is seen initially on the 15-minute image.  Biliary activity reaches small bowel by 30 minutes.  Gallbladder ejection fraction:  20%. Normal gallbladder ejection fraction with Ensure is greater than 33%.  The patient did not experience symptoms after oral ingestion of Ensure.  IMPRESSION:  1.  No evidence of cystic duct or biliary obstruction. 2.  Decreased gallbladder ejection fraction of 20%.   Original Report Authenticated By: Danae Orleans, M.D.    Dg Chest Port  1 View  10/04/2011  *RADIOLOGY REPORT*  Clinical Data: Intractable nausea, vomiting.  PORTABLE CHEST - 1 VIEW  Comparison: 08/08/2011  Findings: Cardiomegaly.  Calcified right granuloma and right hilar/mediastinal lymph nodes.  Scarring in the lung bases bilaterally.  No acute infiltrates or effusions.  IMPRESSION: Cardiomegaly/chronic changes.  No active disease.   Original Report Authenticated By: Cyndie Chime, M.D.    Dg Abd Portable 1v  10/04/2011  *RADIOLOGY REPORT*  Clinical Data: Nausea and vomiting  PORTABLE ABDOMEN - 1 VIEW  Comparison: None.  Findings: The bowel gas pattern is within normal limits.  No obstruction or free air is seen on this supine examination.  There are laminated gallstones in the right upper quadrant.  There is lumbar levoscoliosis. There is a calcified granuloma in the right lower lobe region.  There are also right hilar and subcarinal calcified lymph nodes, consistent with prior granulomatous disease.  IMPRESSION: Nonspecific gas pattern.  Laminated gallstones in the right upper quadrant.   Original Report Authenticated By: Arvin Collard. WOODRUFF III, M.D.   Results for orders placed during the hospital encounter of 10/27/11  COMPREHENSIVE METABOLIC PANEL      Component Value Range   Sodium 134 (*) 135 - 145 mEq/L   Potassium 4.2  3.5 - 5.1 mEq/L   Chloride 99  96 - 112 mEq/L   CO2 16 (*) 19 - 32 mEq/L   Glucose, Bld 94  70 - 99 mg/dL   BUN 15  6 - 23 mg/dL   Creatinine, Ser 1.61  0.50 - 1.35 mg/dL   Calcium 09.6  8.4 - 04.5 mg/dL   Total Protein 8.4 (*) 6.0 - 8.3 g/dL   Albumin 3.3 (*) 3.5 - 5.2 g/dL   AST 18  0 - 37 U/L   ALT 12  0 - 53 U/L   Alkaline Phosphatase 139 (*) 39 - 117 U/L   Total Bilirubin 0.4  0.3 - 1.2 mg/dL   GFR calc non Af Amer 69 (*) >90 mL/min   GFR calc Af Amer 80 (*) >90 mL/min  LIPASE, BLOOD      Component Value Range   Lipase 30  11 - 59 U/L  URINALYSIS, ROUTINE W REFLEX MICROSCOPIC      Component Value Range   Color, Urine YELLOW   YELLOW   APPearance CLEAR  CLEAR   Specific Gravity, Urine 1.020  1.005 - 1.030   pH 8.0  5.0 - 8.0   Glucose, UA NEGATIVE  NEGATIVE mg/dL   Hgb urine dipstick NEGATIVE  NEGATIVE   Bilirubin Urine NEGATIVE  NEGATIVE   Ketones, ur 40 (*) NEGATIVE mg/dL   Protein, ur 30 (*) NEGATIVE mg/dL   Urobilinogen, UA 0.2  0.0 - 1.0 mg/dL   Nitrite NEGATIVE  NEGATIVE   Leukocytes, UA TRACE (*) NEGATIVE  CBC WITH DIFFERENTIAL      Component Value Range   WBC 15.6 (*)  4.0 - 10.5 K/uL   RBC 4.90  4.22 - 5.81 MIL/uL   Hemoglobin 13.1  13.0 - 17.0 g/dL   HCT 14.7 (*) 82.9 - 56.2 %   MCV 79.2  78.0 - 100.0 fL   MCH 26.7  26.0 - 34.0 pg   MCHC 33.8  30.0 - 36.0 g/dL   RDW 13.0 (*) 86.5 - 78.4 %   Platelets 359  150 - 400 K/uL   Neutrophils Relative 86 (*) 43 - 77 %   Neutro Abs 13.5 (*) 1.7 - 7.7 K/uL   Lymphocytes Relative 8 (*) 12 - 46 %   Lymphs Abs 1.3  0.7 - 4.0 K/uL   Monocytes Relative 5  3 - 12 %   Monocytes Absolute 0.8  0.1 - 1.0 K/uL   Eosinophils Relative 0  0 - 5 %   Eosinophils Absolute 0.1  0.0 - 0.7 K/uL   Basophils Relative 0  0 - 1 %   Basophils Absolute 0.0  0.0 - 0.1 K/uL  URINE MICROSCOPIC-ADD ON      Component Value Range   Squamous Epithelial / LPF RARE  RARE   WBC, UA 3-6  <3 WBC/hpf   Urine-Other MUCOUS PRESENT    CULTURE, BLOOD (ROUTINE X 2)      Component Value Range   Specimen Description BLOOD RIGHT HAND     Special Requests BOTTLES DRAWN AEROBIC AND ANAEROBIC 5CC     Culture  Setup Time 10/27/2011 22:26     Culture NO GROWTH 5 DAYS     Report Status 11/02/2011 FINAL    CULTURE, BLOOD (ROUTINE X 2)      Component Value Range   Specimen Description BLOOD RIGHT ARM     Special Requests BOTTLES DRAWN AEROBIC AND ANAEROBIC 5CC     Culture  Setup Time 10/27/2011 22:26     Culture NO GROWTH 5 DAYS     Report Status 11/02/2011 FINAL    BASIC METABOLIC PANEL      Component Value Range   Sodium 138  135 - 145 mEq/L   Potassium 3.4 (*) 3.5 - 5.1 mEq/L   Chloride  102  96 - 112 mEq/L   CO2 18 (*) 19 - 32 mEq/L   Glucose, Bld 94  70 - 99 mg/dL   BUN 13  6 - 23 mg/dL   Creatinine, Ser 6.96  0.50 - 1.35 mg/dL   Calcium 9.4  8.4 - 29.5 mg/dL   GFR calc non Af Amer 70 (*) >90 mL/min   GFR calc Af Amer 81 (*) >90 mL/min  MAGNESIUM      Component Value Range   Magnesium 1.8  1.5 - 2.5 mg/dL  GLUCOSE, CAPILLARY      Component Value Range   Glucose-Capillary 97  70 - 99 mg/dL   Comment 1 Notify RN    CBC      Component Value Range   WBC 16.7 (*) 4.0 - 10.5 K/uL   RBC 4.86  4.22 - 5.81 MIL/uL   Hemoglobin 12.5 (*) 13.0 - 17.0 g/dL   HCT 28.4 (*) 13.2 - 44.0 %   MCV 79.6  78.0 - 100.0 fL   MCH 25.7 (*) 26.0 - 34.0 pg   MCHC 32.3  30.0 - 36.0 g/dL   RDW 10.2 (*) 72.5 - 36.6 %   Platelets 332  150 - 400 K/uL  BASIC METABOLIC PANEL      Component Value Range   Sodium 136  135 -  145 mEq/L   Potassium 3.0 (*) 3.5 - 5.1 mEq/L   Chloride 101  96 - 112 mEq/L   CO2 21  19 - 32 mEq/L   Glucose, Bld 121 (*) 70 - 99 mg/dL   BUN 8  6 - 23 mg/dL   Creatinine, Ser 1.61  0.50 - 1.35 mg/dL   Calcium 9.2  8.4 - 09.6 mg/dL   GFR calc non Af Amer >90  >90 mL/min   GFR calc Af Amer >90  >90 mL/min  HEPATIC FUNCTION PANEL      Component Value Range   Total Protein 7.7  6.0 - 8.3 g/dL   Albumin 3.0 (*) 3.5 - 5.2 g/dL   AST 16  0 - 37 U/L   ALT 9  0 - 53 U/L   Alkaline Phosphatase 115  39 - 117 U/L   Total Bilirubin 0.3  0.3 - 1.2 mg/dL   Bilirubin, Direct 0.1  0.0 - 0.3 mg/dL   Indirect Bilirubin 0.2 (*) 0.3 - 0.9 mg/dL  CBC WITH DIFFERENTIAL      Component Value Range   WBC 15.2 (*) 4.0 - 10.5 K/uL   RBC 4.89  4.22 - 5.81 MIL/uL   Hemoglobin 12.8 (*) 13.0 - 17.0 g/dL   HCT 04.5 (*) 40.9 - 81.1 %   MCV 79.6  78.0 - 100.0 fL   MCH 26.2  26.0 - 34.0 pg   MCHC 32.9  30.0 - 36.0 g/dL   RDW 91.4 (*) 78.2 - 95.6 %   Platelets 365  150 - 400 K/uL   Neutrophils Relative 81 (*) 43 - 77 %   Neutro Abs 12.4 (*) 1.7 - 7.7 K/uL   Lymphocytes Relative 10 (*) 12 -  46 %   Lymphs Abs 1.5  0.7 - 4.0 K/uL   Monocytes Relative 8  3 - 12 %   Monocytes Absolute 1.2 (*) 0.1 - 1.0 K/uL   Eosinophils Relative 1  0 - 5 %   Eosinophils Absolute 0.1  0.0 - 0.7 K/uL   Basophils Relative 0  0 - 1 %   Basophils Absolute 0.0  0.0 - 0.1 K/uL  BASIC METABOLIC PANEL      Component Value Range   Sodium 135  135 - 145 mEq/L   Potassium 2.7 (*) 3.5 - 5.1 mEq/L   Chloride 101  96 - 112 mEq/L   CO2 22  19 - 32 mEq/L   Glucose, Bld 144 (*) 70 - 99 mg/dL   BUN 8  6 - 23 mg/dL   Creatinine, Ser 2.13  0.50 - 1.35 mg/dL   Calcium 9.2  8.4 - 08.6 mg/dL   GFR calc non Af Amer >90  >90 mL/min   GFR calc Af Amer >90  >90 mL/min  MAGNESIUM      Component Value Range   Magnesium 1.7  1.5 - 2.5 mg/dL  BASIC METABOLIC PANEL      Component Value Range   Sodium 138  135 - 145 mEq/L   Potassium 3.0 (*) 3.5 - 5.1 mEq/L   Chloride 104  96 - 112 mEq/L   CO2 23  19 - 32 mEq/L   Glucose, Bld 110 (*) 70 - 99 mg/dL   BUN 11  6 - 23 mg/dL   Creatinine, Ser 5.78  0.50 - 1.35 mg/dL   Calcium 9.0  8.4 - 46.9 mg/dL   GFR calc non Af Amer >90  >90 mL/min   GFR calc Af Amer >  90  >90 mL/min  SEDIMENTATION RATE      Component Value Range   Sed Rate 50 (*) 0 - 16 mm/hr  C-REACTIVE PROTEIN      Component Value Range   CRP 2.7 (*) <0.60 mg/dL  CBC WITH DIFFERENTIAL      Component Value Range   WBC 14.6 (*) 4.0 - 10.5 K/uL   RBC 4.92  4.22 - 5.81 MIL/uL   Hemoglobin 12.9 (*) 13.0 - 17.0 g/dL   HCT 45.4  09.8 - 11.9 %   MCV 79.3  78.0 - 100.0 fL   MCH 26.2  26.0 - 34.0 pg   MCHC 33.1  30.0 - 36.0 g/dL   RDW 14.7 (*) 82.9 - 56.2 %   Platelets 341  150 - 400 K/uL   Neutrophils Relative 83 (*) 43 - 77 %   Neutro Abs 12.1 (*) 1.7 - 7.7 K/uL   Lymphocytes Relative 8 (*) 12 - 46 %   Lymphs Abs 1.1  0.7 - 4.0 K/uL   Monocytes Relative 9  3 - 12 %   Monocytes Absolute 1.3 (*) 0.1 - 1.0 K/uL   Eosinophils Relative 1  0 - 5 %   Eosinophils Absolute 0.1  0.0 - 0.7 K/uL   Basophils  Relative 0  0 - 1 %   Basophils Absolute 0.0  0.0 - 0.1 K/uL  PHOSPHORUS      Component Value Range   Phosphorus 3.1  2.3 - 4.6 mg/dL  CBC WITH DIFFERENTIAL      Component Value Range   WBC 9.5  4.0 - 10.5 K/uL   RBC 4.61  4.22 - 5.81 MIL/uL   Hemoglobin 12.0 (*) 13.0 - 17.0 g/dL   HCT 13.0 (*) 86.5 - 78.4 %   MCV 80.0  78.0 - 100.0 fL   MCH 26.0  26.0 - 34.0 pg   MCHC 32.5  30.0 - 36.0 g/dL   RDW 69.6 (*) 29.5 - 28.4 %   Platelets 290  150 - 400 K/uL   Neutrophils Relative 70  43 - 77 %   Neutro Abs 6.6  1.7 - 7.7 K/uL   Lymphocytes Relative 15  12 - 46 %   Lymphs Abs 1.4  0.7 - 4.0 K/uL   Monocytes Relative 10  3 - 12 %   Monocytes Absolute 1.0  0.1 - 1.0 K/uL   Eosinophils Relative 4  0 - 5 %   Eosinophils Absolute 0.4  0.0 - 0.7 K/uL   Basophils Relative 1  0 - 1 %   Basophils Absolute 0.1  0.0 - 0.1 K/uL  BASIC METABOLIC PANEL      Component Value Range   Sodium 135  135 - 145 mEq/L   Potassium 3.3 (*) 3.5 - 5.1 mEq/L   Chloride 101  96 - 112 mEq/L   CO2 23  19 - 32 mEq/L   Glucose, Bld 103 (*) 70 - 99 mg/dL   BUN 18  6 - 23 mg/dL   Creatinine, Ser 1.32  0.50 - 1.35 mg/dL   Calcium 9.1  8.4 - 44.0 mg/dL   GFR calc non Af Amer 62 (*) >90 mL/min   GFR calc Af Amer 72 (*) >90 mL/min   A/P: Pt with hx of persistent T11-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 Plan is for fluoroscopic guided aspiration of T11-12 disc space, possible paraspinal fluid collection today. Details/risks of procedure d/w pt/wife with their understanding and consent.

## 2011-11-02 NOTE — Progress Notes (Signed)
Clinical Social Work Department CLINICAL SOCIAL WORK PSYCHIATRY SERVICE LINE ASSESSMENT 11/02/2011  Patient:  John Brandt  Account:  [id number]  Admit Date:  10/27/2011  Clinical Social Worker:  Doroteo Glassman  Date/Time:  11/02/2011 03:53 PM Referred by:  Physician  Date referred:  11/02/2011 Reason for Referral  Behavioral Health Issues   Presenting Symptoms/Problems (In the person's/family's own words):   "I have a lot of stress related to my job."    Abuse/Neglect/Trauma Comments:   Psychiatric History (check all that apply)  Denies history   Psychiatric medications:  Prozac, by hx   Current Mental Health Hospitalizations/Previous Mental Health History:   Current provider:   Place and Date:   Current Medications:   See H&P   Previous Impatient Admission/Date/Reason:   Emotional Health / Current Symptoms    Suicide/Self Harm  None reported   Suicide attempt in the past:   Other harmful behavior:   Psychotic/Dissociative Symptoms  None reported   Other Psychotic/Dissociative Symptoms:    Attention/Behavioral Symptoms  Within Normal Limits   Other Attention / Behavioral Symptoms:    Cognitive Impairment  Within Normal Limits   Other Cognitive Impairment:    Mood and Adjustment  Flat    Stress, Anxiety, Trauma, Any Recent Loss/Stressor  Other - See comment   Anxiety (frequency):   Phobia (specify):   Compulsive behavior (specify):   Obsessive behavior (specify):   Other:   Pt reports a lot of work-related stress.   Substance Abuse/Use  None   SBIRT completed (please refer for detailed history):  N  Self-reported substance use:   Urinary Drug Screen Completed:  N Alcohol level:    Environmental/Housing/Living Arrangement  Stable housing   Who is in the home:   Wife   Emergency contact:  Wife   Personnel officer   Patient's Strengths and Goals (patient's own words):   Clinical Social Worker's Interpretive  Summary:   Met with Pt and wife to discuss current admission.    Pt reports depression related to current admission.  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    Pt states that his health problems began in March and that his hospitalizations have negatively affected his work, as Pt has to ask others for assistance and has had to lean on colleagues for various work-related events.  Pt admits that he has trouble asking for help and states that it's somewhat of an ego-blow to have colleagues do his work on his behalf.    Pt reports that he was on Prozac for approx 3 months years ago and, while it helped him out of a "black hole", long-term use made him feel drowsy.  Thus, he stopped the med.  Pt denies any outpt mental health tx but did state that he and a group of men formed their own group that met once a week for 7 years.  The purpose of the group was to process life events and to provide support to each other.    Pt denied current or hx of SI, HI, AVH, paranoia, delusions.    Pt is hopeful that his mental health will improve upon d/c.    Psych MD to meet with Pt.    CSW to provide outpt mental health information.    CSW thanked Pt and his wife for their time.   Disposition:  Recommend Psych CSW continuing to  support while in hospital  CSW to continue to follow.  Providence Crosby, LCSWA Clinical Social Work [phone]

## 2011-11-02 NOTE — Consult Note (Signed)
Patient Identification:  John Brandt Date of Evaluation:  11/02/2011 Reason for Consult:Depression  Referring Provider: Dr. Betti Cruz  History of Present Illness: Patient is admitted multiple times for intractable nausea and vomiting. There is a history in concern for vomiting coffee-ground emesis and passing dark stools. He is admitted for evaluation and observation. Past Psychiatric History: He has an epidural abscess and an unintentional weight loss of 50 pounds since July.   Past Medical History:     Past Medical History  Diagnosis Date  . Hypertension   . Hyperlipidemia   . Allergic rhinitis   . Hx of cardiac cath   . Sleep apnea     sleep study 07-02-02  . Chronic back pain   . 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 Complication of anesthesia   . PONV (postoperative nausea and vomiting)        Past Surgical History  Procedure Date  . Vasectomy   . Dental implants   . Cardiac catheterization     more than 15 yrs ago  . Back surgery   . Open biopsy on back 08/08/2011  . Esophagogastroduodenoscopy 10/28/2011    Procedure: ESOPHAGOGASTRODUODENOSCOPY (EGD);  Surgeon: Hart Carwin, MD;  Location: [facility];  Service: Endoscopy;  Laterality: N/A;    Allergies:  Allergies  Allergen Reactions  . Fish-Derived Products Nausea And Vomiting  . Cefazolin Rash    rash  . Iodine Rash  . Penicillins Rash    Current Medications:  Prior to Admission medications   Medication Sig Start Date End Date Taking? Authorizing Provider  acetaminophen (TYLENOL) 500 MG tablet Take 1,000 mg by mouth every 6 (six) hours as needed. For pain   Yes Historical Provider, MD  amLODipine (NORVASC) 10 MG tablet Take 10 mg by mouth daily.  01/22/11  Yes Historical Provider, MD  aspirin EC 81 MG tablet Take 81 mg by mouth daily.   Yes Historical Provider, MD  atorvastatin (LIPITOR) 10 MG tablet Take 10 mg by mouth daily.   12/19/10  Yes Historical Provider, MD  cloNIDine (CATAPRES) 0.2 MG tablet Take 1 tablet (0.2 mg total) by mouth 3 (three) times daily. Hold unless BP > 140 when checked 07/02/11  Yes Jessica U Vann, DO  lidocaine (LIDODERM) 5 % Place 1 patch onto the skin daily. Remove & Discard patch within 12 hours or as directed by MD 10/08/11  Yes Altha Harm, MD  metoprolol succinate (TOPROL-XL) 50 MG 24 hr tablet Take 50 mg by mouth daily. Take with or immediately following a meal.   Yes Historical Provider, MD  Multiple Vitamin (MULTIVITAMIN WITH MINERALS) TABS Take 1 tablet by mouth daily.   Yes Historical Provider, MD  naproxen (NAPROSYN) 500 MG tablet Take 1 tablet (500 mg total) by mouth every 12 (twelve) hours as needed ( Pain-Take with food ). 10/08/11  Yes Altha Harm, MD  ondansetron (ZOFRAN) 8 MG tablet Take 8 mg by mouth every 8 (eight) hours as needed. For nausea.   Yes Historical Provider, MD  polyethylene glycol (MIRALAX / GLYCOLAX) packet Take 17 g by mouth daily as needed. For constipation.   Yes Historical Provider, MD  potassium chloride SA (K-DUR,KLOR-CON) 20 MEQ tablet Take 40 mEq by mouth 3 (three) times daily.   Yes Historical Provider, MD  spironolactone (ALDACTONE) 25 MG tablet Take 25 mg by mouth daily.  02/28/11  Yes Historical Provider, MD  triamcinolone (NASACORT) 55 MCG/ACT nasal inhaler Place 2 sprays into the nose daily. 2 sprays in each nostril daily 12/15/10  Yes Historical Provider, MD  vitamin C (ASCORBIC ACID) 500 MG tablet Take 500 mg by mouth daily.   Yes Historical Provider, MD  Vitamin D, Ergocalciferol, (DRISDOL) 50000 UNITS CAPS Take 50,000 Units by mouth every 7 (seven) days. Taken on Saturdays. 02/26/11  Yes Historical Provider, MD  ondansetron (ZOFRAN ODT) 4 MG disintegrating tablet Take 1 tablet (4 mg total) by mouth every 8 (eight) hours as needed for nausea. 10/27/11   Loren Racer, MD  traMADol (ULTRAM) 50 MG tablet Take 1 tablet (50 mg total) by  mouth every 6 (six) hours as needed. 10/27/11   Loren Racer, MD    Social History:    reports that he has never smoked. He has never used smokeless tobacco. He reports that he drinks alcohol. He reports that he does not use illicit drugs.   Family History:    Family History  Problem Relation Age of Onset  . Allergies Mother   . Heart disease Mother   . Lung cancer Mother     mesothelioma  . Aneurysm Brother     brain  . Heart disease Father   . Lung cancer Father     Mental Status Examination/Evaluation: Objective:  Appearance: Casual  Psychomotor Activity:  Normal  Eye Contact::  Good  Speech:  Clear and Coherent  Volume:  Normal  Mood:  Depressed  Affect:  Appropriate and Congruent  Thought Process:  Coherent, Relevant and Intact  Orientation:  Full  Thought Content:  Concerned about work/teaching obligations at [employer]. Her   Suicidal Thoughts:  No  Homicidal Thoughts:  No  Judgement:  Good  Insight:  Good    DIAGNOSIS:   AXIS I   depression moderate. Complicated by medical conditions   AXIS II  Deffered  AXIS III See medical notes.  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 He engages in conversation readily and expresses the difficulties he's had with intractable nausea and vomiting. He is relieved now that this is under control. He also describes the epidural lesion that has caused some infection despite treatment and it is felt that it will be better controlled after this hospitalization. 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  He is calm with good eye contact and is oriented to. He is aware of his medical conditions and knows the regimen that needs to be followed. He has lost weight and  expresses a desire to lose a bit more. This is in context with his discussion about his concern for his other medical conditions. He demonstrates good concentration and appropriate responses to inquiries.  He is future oriented and eager to return to work. He realizes he has a serious infection and states that his wife will receive family medical leave act excuse from work so she can administer his IV treatments. He states the only problem he has had his insomnia because he cannot sleep in unfamiliar places. He does well at home he also reports that his family has a history of depression although did not severe and he does not report any suicide attempts among family members. He agrees to consider an antidepressant medication that may  be helpful during this treatment and in conjunction with his demands at work. He is taking tramadol and therefore the SSRI antidepressants are contraindicated. However he may do better with the dopamine/norepinephrine contributions of the Wellbutrin. It is suggested that he might have the Ambien 5 mg at night for sleep as needed to avoid sleep deprivation RECOMMENDATION:  1. This patient is cognitively intact and suggest return home and medically stable; anxiety to return to work as noted. 2.  Suggest Wellbutrin in graduated doses: Wellbutrin 75 mg after breakfast for one week; increase to Wellbutrin XL 150 mg after breakfast daily. Provided this is compatible with his other medications. 3.  Suggest Ambien 5 mg at bedtime as needed. 4.  No further psychiatric needs identified unless requested. M.D. Psychiatrist signs off Georgann Bramble J. Ferol Luz, MD Psychiatrist  11/02/2011   4:40 PM

## 2011-11-02 NOTE — Procedures (Signed)
T11-12 disc and paraspinal aspiration under fluoro 4ml bloody fluid No complication No blood loss. See complete dictation in [facility].

## 2011-11-02 NOTE — Progress Notes (Signed)
[facility]    Date of Admission:  10/27/2011   Total days of antibiotics 3        Day 3 vancomycin           ID: John Brandt is a 61 y.o. male with h/o T12 paraspinal/osteo with MSSA presents with nausea, vomiting thought to be due to GI source, but repeat imaging after completion of 6 wk of IV antibiotics reveal worsening destruction to endplate T11-T12 plus small peripheral enhancing fluid collection 2.3 x 2 cm. With elevated ESR 50 and CRP 2.7, concerning for ongoing/untreated infection.     Principal Problem:  *Intractable nausea and vomiting Active Problems:  Leukocytosis  Hypokalemia  Paraspinal abscess  Dehydration  Metabolic acidosis  Duodenal ulcer  Gastritis  Reflux esophagitis  Cholecystitis chronic, acute    Subjective: He has tolerated taking a full meal for dinner last night without vomiting. He has ambulated down the hall without considerable pain. Had a BM. Underwent fluoro guided biopsy with a small amount of discomfort  Medications:     . amLODipine  10 mg Oral Daily  . cloNIDine  0.2 mg Oral TID  . fluticasone  2 spray Each Nare Daily  . lidocaine  1 patch Transdermal Q24H  . metoCLOPramide (REGLAN) injection  10 mg Intravenous Q6H  . metoprolol succinate  100 mg Oral Daily  . multivitamin with minerals  1 tablet Oral Daily  . pantoprazole  40 mg Oral BID  . sucralfate  1 g Oral Q6H  . vancomycin  1,000 mg Intravenous Q12H  . DISCONTD: vancomycin  1,000 mg Intravenous Q12H    Objective: Vital signs in last 24 hours: Temp:  [97.6 F (36.4 C)-98.4 F (36.9 C)] 97.9 F (36.6 C) (10/30 1357) Pulse Rate:  [51-70] 58  (10/30 1608) Resp:  [18-20] 18  (10/30 1608) BP: (113-139)/(75-95) 138/85 mmHg (10/30 1608) SpO2:  [92 %-100 %] 97 % (10/30 1608)  Physical Exam  Constitutional: He is oriented to person, place, and time. He appears well-developed and well-nourished. No distress.  HENT:  Mouth/Throat:  Oropharynx is clear and moist. No oropharyngeal exudate.  Cardiovascular: Normal rate, regular rhythm and normal heart sounds. Exam reveals no gallop and no friction rub.  No murmur heard.  Pulmonary/Chest: Effort normal and breath sounds normal. No respiratory distress. He has no wheezes.  Abdominal: Soft. Bowel sounds are normal. He exhibits no distension. There is no tenderness.  Lymphadenopathy:  He has no cervical adenopathy.  Neurological: He is alert and oriented to person, place, and time.  Skin: Skin is warm and dry. No rash noted. No erythema.  Psychiatric: He has a normal mood and affect. His behavior is normal.    Lab Results  Basename 11/01/11 0530 10/31/11 0510  WBC 9.5 14.6*  HGB 12.0* 12.9*  HCT 36.9* 39.0  NA 135 138  K 3.3* 3.0*  CL 101 104  CO2 23 23  BUN 18 11  CREATININE 1.21 0.80  GLU -- --   Sedimentation Rate  Basename 10/30/11 1835  ESRSEDRATE 50*   C-Reactive Protein  Basename 10/30/11 1835  CRP 2.7*    Microbiology: 10/24 blood cx x 2 NGTD 10/30 spinal aspirate PENDING  Studies/Results: Ir Fl Guide Spinal/si Jt Inj Right  11/02/2011  *RADIOLOGY REPORT*  Clinical data:  Progressive diskitis T11-12 with paraspinal abscess  THORACIC DISC ASPIRATION UNDER FLUOROSCOPY  Technique and findings: The procedure, risks (including but not limited to bleeding, infection, organ damage),  benefits, and alternatives were explained to the patient.  Questions regarding the procedure were encouraged and answered.  The patient understands and consents to the procedure.The patient was placed prone and an appropriate skin shows identified.  Site was marked, prepped with Betadine, draped in usual sterile fashion, infiltrated locally with 1% lidocaine. Maximal barrier sterile technique was utilized including caps, mask, sterile gowns, sterile gloves, sterile drape, hand hygiene and skin antiseptic.  Intravenous Fentanyl and Versed were administered as conscious sedation  during continuous cardiorespiratory monitoring by the radiology RN, with a total moderate sedation time of 20 minutes.  Under intermittent fluoroscopic guidance, a 16 gauge trocar needle was advanced into the left paraspinal region and a scant amount of bloody fluid was aspirated.  The needle was advanced into the T11- 12 interspace and approximately 5 ml of bloody fluid were aspirated.  The sample was sent for routine Gram stain and culture. The patient tolerated the procedure well.  No immediate complication.  IMPRESSION: Technically successful left paraspinal and thoracic T11-12 disc aspiration under fluoroscopy   Original Report Authenticated By: Osa Craver, M.D.      Assessment/Plan: T11-T12 osteomyelitis with irregular enhancement at T11-T12 and also peripherally enhancing fluid collection 2.3 x 2cm s/p IR aspiration  -will restart on vancomycin tonight at 1gm Q 12 hr. Will need trough before 4th dose. Plan on discharge on vancomycin. Goal of 15-20. Treat for 6 wks. Will follow up on cultures as o/p to decide if need to expand his antibiotic regimen  Jametta Moorehead, [facility] Infect[facility] Diseases Cell: [phone] Pager: [phone]  11/02/2011, 4:29 PM

## 2011-11-03 DIAGNOSIS — F329 Major depressive disorder, single episode, unspecified: Secondary | ICD-10-CM

## 2011-11-03 DIAGNOSIS — A4101 Sepsis due to Methicillin susceptible Staphylococcus aureus: Secondary | ICD-10-CM

## 2011-11-03 LAB — BASIC METABOLIC PANEL
Calcium: 8.8 mg/dL (ref 8.4–10.5)
GFR calc Af Amer: 81 mL/min — ABNORMAL LOW (ref >90)
GFR calc non Af Amer: 70 mL/min — ABNORMAL LOW (ref >90)
Potassium: 3.2 mEq/L — ABNORMAL LOW (ref 3.5–5.1)
Sodium: 133 mEq/L — ABNORMAL LOW (ref 135–145)

## 2011-11-03 LAB — CBC
MCHC: 32.8 g/dL (ref 30.0–36.0)
RDW: 15.8 % — ABNORMAL HIGH (ref 11.5–15.5)

## 2011-11-03 MED ORDER — OXYCODONE-ACETAMINOPHEN 5-325 MG PO TABS: 0.5000 | ORAL_TABLET | Freq: Three times a day (TID) | ORAL | Status: DC | PRN

## 2011-11-03 MED ORDER — ZOLPIDEM TARTRATE 5 MG PO TABS: 5.0000 mg | ORAL_TABLET | Freq: Every evening | ORAL | Status: DC | PRN

## 2011-11-03 MED ORDER — POTASSIUM CHLORIDE CRYS ER 20 MEQ PO TBCR
20.0000 meq | EXTENDED_RELEASE_TABLET | Freq: Once | ORAL | Status: AC
  Administered 2011-11-03: 20 meq via ORAL
  Filled 2011-11-03: qty 1

## 2011-11-03 MED ORDER — METOPROLOL SUCCINATE ER 100 MG PO TB24: 100.0000 mg | ORAL_TABLET | Freq: Every day | ORAL | Status: DC

## 2011-11-03 MED ORDER — BUPROPION HCL 75 MG PO TABS
75.0000 mg | ORAL_TABLET | Freq: Every morning | ORAL | Status: DC
  Administered 2011-11-03: 75 mg via ORAL
  Filled 2011-11-03: qty 1

## 2011-11-03 MED ORDER — METOCLOPRAMIDE HCL 5 MG PO TABS: 5.0000 mg | ORAL_TABLET | Freq: Three times a day (TID) | ORAL | Status: DC

## 2011-11-03 MED ORDER — BUPROPION HCL 75 MG PO TABS: 75.0000 mg | ORAL_TABLET | Freq: Every morning | ORAL | Status: DC

## 2011-11-03 MED ORDER — BOOST / RESOURCE BREEZE PO LIQD: 1.0000 | ORAL | Status: DC | PRN

## 2011-11-03 MED ORDER — HEPARIN SOD (PORK) LOCK FLUSH 100 UNIT/ML IV SOLN
250.0000 [IU] | INTRAVENOUS | Status: AC | PRN
  Administered 2011-11-03: 250 [IU]

## 2011-11-03 MED ORDER — PANTOPRAZOLE SODIUM 40 MG PO TBEC: 40.0000 mg | DELAYED_RELEASE_TABLET | Freq: Two times a day (BID) | ORAL | Status: DC

## 2011-11-03 MED ORDER — BUPROPION HCL ER (XL) 150 MG PO TB24: 150.0000 mg | ORAL_TABLET | Freq: Every day | ORAL | Status: DC

## 2011-11-03 MED ORDER — VANCOMYCIN HCL IN DEXTROSE 1-5 GM/200ML-% IV SOLN: 1000.0000 mg | Freq: Two times a day (BID) | INTRAVENOUS | Status: DC

## 2011-11-03 MED ORDER — SUCRALFATE 1 GM/10ML PO SUSP: 1.0000 g | Freq: Three times a day (TID) | ORAL | Status: DC

## 2011-11-03 NOTE — Progress Notes (Signed)
ANTIBIOTIC CONSULT NOTE  Pharmacy Consult for Vancomycin Indication: T11-T12 osteomyelitis  Allergies  Allergen Reactions  . Fish-Derived Products Nausea And Vomiting  . Cefazolin Rash    rash  . Iodine Rash  . Penicillins Rash    Patient Measurements: Height: 6\' 1"  (185.4 cm) Weight: 252 lb 1.6 oz (114.352 kg) IBW/kg (Calculated) : 79.9   Vital Signs: Temp: 98.3 F (36.8 C) (10/31 0745) Temp src: Oral (10/31 0745) BP: 156/97 mmHg (10/31 0745) Pulse Rate: 68  (10/31 0745)  Labs:  Basename 11/03/11 0515 11/01/11 0530  WBC 8.4 9.5  HGB 10.0* 12.0*  PLT 210 290  LABCREA -- --  CREATININE 1.10 1.21   Estimated Creatinine Clearance: 92.3 ml/min (by C-G formula based on Cr of 1.1). No results found for this basename: VANCOTROUGH:2,VANCOPEAK:2,VANCORANDOM:2,GENTTROUGH:2,GENTPEAK:2,GENTRANDOM:2,TOBRATROUGH:2,TOBRAPEAK:2,TOBRARND:2,AMIKACINPEAK:2,AMIKACINTROU:2,AMIKACIN:2, in the last 72 hours   Microbiology: Recent Results (from the past 720 hour(s))  CULTURE, BLOOD (ROUTINE X 2)     Status: Normal   Collection Time   10/27/11  3:30 PM      Component Value Range Status Comment   Specimen Description BLOOD RIGHT HAND   Final    Special Requests BOTTLES DRAWN AEROBIC AND ANAEROBIC 5CC   Final    Culture  Setup Time 10/27/2011 22:26   Final    Culture NO GROWTH 5 DAYS   Final    Report Status 11/02/2011 FINAL   Final   CULTURE, BLOOD (ROUTINE X 2)     Status: Normal   Collection Time   10/27/11  4:05 PM      Component Value Range Status Comment   Specimen Description BLOOD RIGHT ARM   Final    Special Requests BOTTLES DRAWN AEROBIC AND ANAEROBIC 5CC   Final    Culture  Setup Time 10/27/2011 22:26   Final    Culture NO GROWTH 5 DAYS   Final    Report Status 11/02/2011 FINAL   Final   BODY FLUID CULTURE     Status: Normal (Preliminary result)   Collection Time   11/02/11  2:11 PM      Component Value Range Status Comment   Specimen Description BONE   Final    Special  Requests NONE   Final    Gram Stain     Final    Value: MODERATE WBC PRESENT, PREDOMINANTLY PMN     NO ORGANISMS SEEN   Culture PENDING   Incomplete    Report Status PENDING   Incomplete     Medical History: Past Medical History  Diagnosis Date  . Hypertension   . Hyperlipidemia   . Allergic rhinitis   . Hx of cardiac cath   . Sleep apnea     sleep study 07-02-02  . Chronic back pain   . 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 Complication of anesthesia   . PONV (postoperative nausea and vomiting)     Assessment:  60 YOM with severe osteomyelitis and diskitis at T11-T12 with paraspinal abscesses.  Paraspinal abscess was biopsied 08/08/11 and grew MSSA for which the patient was treated with 6 weeks of antibiotics per ID service (started with cefazolin but pt eventually developed rash on this antibiotic and was transitioned to vancomycin to complete therapy).   Vancomycin restarted upon admission for new findings of worsening osteomyelitis.    D#2 Vancomycin since doses held per ID  SCr stable  Goal  of Therapy:  Vancomycin trough level 15-20 mcg/ml  Plan:  1) Continue vancomycin 1g IV q12 per ID 2) per ID, a vanc trough is needed prior to 4th dose which will be tomorrow AM.  3) noted plan for patient to potentially be discharged today so trough will need to be drawn prior to dose tomorrow AM at home (goal 15-20)   Hessie Knows, PharmD, BCPS Pager [phone] 11/03/2011 9:38 AM

## 2011-11-03 NOTE — Discharge Summary (Signed)
Physician Discharge Summary  Rishard Delange OZH:[id number] DOB: 02-13-1950 DOA: 10/27/2011  PCP: Londell Moh, MD  Admit date: 10/27/2011 Discharge date: 11/03/2011  Recommendations for Outpatient Follow-up:  Followup with Londell Moh, MD (PCP) in 1 week.  Followup with ID (Dr. Drue Second) in 1-2 weeks, will be called with appointment date and time.  Followup with Dr. Jordan Likes (neurosurgery) as already scheduled.  Followup with Dr. Biagio Quint (general surgery) in 2-3 weeks after discharge.  Followup with Dr. Lillia Mountain (GI) in 1 month.  Home health to draw weekly CBC, BMET, and vancomycin troughs and have results sent to ID clinic.  ID to followup on fluro guided paraspinal abcess cultures after discharge.  Discharge Diagnoses:  Principal Problem:  *Intractable nausea and vomiting Active Problems:  Leukocytosis  Hypokalemia  Paraspinal abscess  Dehydration  Metabolic acidosis  Duodenal ulcer  Gastritis  Reflux esophagitis  Cholecystitis chronic, acute  Discharge Condition: Stable  Diet recommendation: Heart healthy diet.  Filed Weights   10/27/11 2000  Weight: 114.352 kg (252 lb 1.6 oz)    History of present illness:  On admission on 10/27/2011 "Pleasant 61 y/o man who presents with a 2 day history of intractable nausea and vomiting. He has vomited at least 15 times. Emesis is brown/black colored. When asked he does admit to having some dark stools, but has not noticed any frank blood. He has been taking naproxen twice a day pretty consistently. He has had multiple admissions for these issues and always has significant electrolyte abnormalities and ARF that corroborate his history of severe n/v. His PMH is significant for an MSSA Epidural abscess for which he completed 6 weeks of IV antibiotics under the care of Dr. Daiva Eves. Has had a 50 pound weight loss since July (unintentional). In the ED he is found to be hypokalemic with metabolic acidosis and we are asked  to admit him for further evaluation and management. Upon questioning he admits to having epigastric pain, a sour taste in his mouth and frequent belching."  Hospital Course:  Nausea and vomiting  EGD done shows signs of severe distal esophagitis, duodenitis and 2 small gastric ulcer, but as per GI apprears to be secondary to intractable vomiting rather than cause of it. Biopsy sent for H pylori negative, shows duodenitis. Continue phenergan and reglan QID. To continue reglan for 1 week after discharge . Much improved after IV vancomycin was started, suspect infection was contributing to his nausea and vomiting. Continue PPI and Carafate after discharge. US abdomen shows cholelithiasis. 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 Repeated on 10/25 shows decreased GB ejection fraction of 20%. General surgery, given improvement in his symptoms recommended elective cholecystectomy in the near future once other issues are resolved to see if gallstones are really symptomatic.   Gastric ulcer, esophagitis and duodenitis  As noted above on EGD, related to persistent nausea and vomiting and taking motrin for back pain. Hpylori negative. Continue PPI on discharge.  Anion gap metabolic acidosis  Associated with nausea and vomiting. Now resolved.   MSSA paraspinal abscess  Completed 6 weeks of abx (cefazolin followed by vanco) in August. Patient still has low back pain. MRI done to evaluate the paraspinal abscess as follow up shows worsening diskitis at T12 with worsening left paraspinal abscess. Patient had  fluro guided paraspinal abscess aspirated on 11/02/2011, ID will followup on the cultures after discharge. Dr. Gonzella Lex discussed with Dr. Jordan Likes who reviewed the film and considers this a true infection vs a reactive change from previous infection  and surgery. ID consulted and recommended starting IV vancomycin for 6 weeks and will change antibiotics depending of fluro aspiration cultures. Elevated CRP and ESR.  Pain control with prn percocet. PICC line placed on 11/02/2011 and pharmacy dosing his vancomycin. Informed CM to set up home care. Per ID will need weekly CBC, BMET, and vancomycin troughs, results to be sent to ID clinic, Re: Dr. Drue Second. Patient will need vancomycin trough to be arranged on 11/04/2011 at home by home health.  Hypokalemia  Replace as needed. Magnesium normal.  HTN Elevated BP noted. Increased dose of toprol. Continue amlodipine. Increased prn hydralazine dose. Appears patient is on clonidine at home and was not getting it here. Have resumed it and better controlled now.   Depression Denies any suicidal thoughts or ideation. Wife noted that given his complicated medical course over the last few months patient has been feeling depressed and requested psychiatry consultation.  Psychiatry consultation obtained.  Anemia Likely due to chronic disease. Hemoglobin stable.  Consultants:  Juanda Chance (GI)  [facility]  Neurosurgery (Dr. Jordan Likes-- discussed over the phone on 10/27)  ID (Dr snyder)  Psychiatry  Procedures:  EGD on 10/25  HIDA scan on 10/25  MRI spine 10/27   Antibiotics:  IV vanco (10/27)  Discharge Exam: Filed Vitals:   11/02/11 2123 11/03/11 0653 11/03/11 0745 11/03/11 1015  BP: 125/79 110/71 156/97 146/92  Pulse: 59 62 68 89  Temp: 98.4 F (36.9 C) 98.9 F (37.2 C) 98.3 F (36.8 C) 98.3 F (36.8 C)  TempSrc: Oral Oral Oral Oral  Resp: 18 16 18 18   Height:      Weight:      SpO2: 96% 94% 96% 96%   Discharge Instructions  Discharge Orders    Future Appointments: Provider: Department: Dept Phone: Center:   11/30/2011 9:45 AM Randall Hiss, MD [facility] [phone] [facility]     Future Orders Please Complete By Expires   Diet - low sodium heart healthy      Increase activity slowly      Discharge instructions      Comments:   Followup with Londell Moh, MD (PCP) in 1 week.  Followup with ID (Dr. Drue Second) in 1-2 weeks, will be called  with appointment date and time.  Followup with Dr. Jordan Likes (neurosurgery) as already scheduled.  Followup with Dr. Biagio Quint (general surgery) in 2-3 weeks after discharge.  Followup with Dr. Lillia Mountain (GI) in 1 month.  Home health to draw weekly CBC, BMET, and vancomycin troughs and have results sent to ID clinic.       Medication List     As of 11/03/2011 11:18 AM    STOP taking these medications         naproxen 500 MG tablet   Commonly known as: NAPROSYN      TAKE these medications         acetaminophen 500 MG tablet   Commonly known as: TYLENOL   Take 1,000 mg by mouth every 6 (six) hours as needed. For pain      amLODipine 10 MG tablet   Commonly known as: NORVASC   Take 10 mg by mouth daily.      aspirin EC 81 MG tablet   Take 81 mg by mouth daily.      atorvastatin 10 MG tablet   Commonly known as: LIPITOR   Take 10 mg by mouth daily.      buPROPion 75 MG tablet   Commonly known as:  WELLBUTRIN   Take 1 tablet (75 mg total) by mouth every morning. Until 11/09/2011. The please start Wellbutrin XL 150 mg daily on 11/10/2011.      buPROPion 150 MG 24 hr tablet   Commonly known as: WELLBUTRIN XL   Take 1 tablet (150 mg total) by mouth daily. Please start after completing bupropion 75 mg daily.      cloNIDine 0.2 MG tablet   Commonly known as: CATAPRES   Take 1 tablet (0.2 mg total) by mouth 3 (three) times daily. Hold unless BP > 140 when checked      feeding supplement Liqd   Take 1 Container by mouth as needed (For additional nutrition ).      lidocaine 5 %   Commonly known as: LIDODERM   Place 1 patch onto the skin daily. Remove & Discard patch within 12 hours or as directed by MD      metoCLOPramide 5 MG tablet   Commonly known as: REGLAN   Take 1 tablet (5 mg total) by mouth 4 (four) times daily -  before meals and at bedtime. For 1 week then discontinue.      metoprolol succinate 100 MG 24 hr tablet   Commonly known as: TOPROL-XL   Take 1 tablet (100 mg  total) by mouth daily. Take with or immediately following a meal.      multivitamin with minerals Tabs   Take 1 tablet by mouth daily.      ondansetron 4 MG disintegrating tablet   Commonly known as: ZOFRAN-ODT   Take 1 tablet (4 mg total) by mouth every 8 (eight) hours as needed for nausea.      ondansetron 8 MG tablet   Commonly known as: ZOFRAN   Take 8 mg by mouth every 8 (eight) hours as needed. For nausea.      oxyCODONE-acetaminophen 5-325 MG per tablet   Commonly known as: PERCOCET/ROXICET   Take 0.5-1 tablets by mouth every 8 (eight) hours as needed.      pantoprazole 40 MG tablet   Commonly known as: PROTONIX   Take 1 tablet (40 mg total) by mouth 2 (two) times daily.      polyethylene glycol packet   Commonly known as: MIRALAX / GLYCOLAX   Take 17 g by mouth daily as needed. For constipation.      potassium chloride SA 20 MEQ tablet   Commonly known as: K-DUR,KLOR-CON   Take 40 mEq by mouth 3 (three) times daily.      spironolactone 25 MG tablet   Commonly known as: ALDACTONE   Take 25 mg by mouth daily.      sucralfate 1 GM/10ML suspension   Commonly known as: CARAFATE   Take 10 mLs (1 g total) by mouth 4 (four) times daily -  with meals and at bedtime.      traMADol 50 MG tablet   Commonly known as: ULTRAM   Take 1 tablet (50 mg total) by mouth every 6 (six) hours as needed.      triamcinolone 55 MCG/ACT nasal inhaler   Commonly known as: NASACORT   Place 2 sprays into the nose daily. 2 sprays in each nostril daily      vancomycin 1 GM/200ML Soln   Commonly known as: VANCOCIN   Inject 200 mLs (1,000 mg total) into the vein every 12 (twelve) hours. For total of 6 week.      vitamin C 500 MG tablet   Commonly known as: ASCORBIC ACID  Take 500 mg by mouth daily.      Vitamin D (Ergocalciferol) 50000 UNITS Caps   Commonly known as: DRISDOL   Take 50,000 Units by mouth every 7 (seven) days. Taken on Saturdays.           Follow-up Information     Follow up with Acey Lav, MD. Schedule an appointment as soon as possible for a visit in 1 week.   Contact information:   [address] [place] [address] [phone]       Follow up with [facility] (As needed if symptoms worsen)    Contact information:   [address] [id number] mc [place] [address] [phone]      Follow up with Robyne Askew, MD. Schedule an appointment as soon as possible for a visit in 2 weeks. (Same group as Dr. Biagio Quint, Call after they have your spinal infections controlled.)    Contact information:   [address] [place] [address] [phone]       Follow up with [facility], MD. (As already scheduled.)    Contact information:   [address] [place] [address] [phone]       Follow up with Lina Sar, MD. Schedule an appointment as soon as possible for a visit in 1 month.   Contact information:   [address] [place] [address] [phone]           The results of significant diagnostics from this hospitalization (including imaging, microbiology, ancillary and laboratory) are listed below for reference.    Significant Diagnostic Studies: Dg Chest 2 View  10/27/2011  *RADIOLOGY REPORT*  Clinical Data: Leukocytosis and weakness.  Question pneumonia.  CHEST - 2 VIEW  Comparison: One-view chest 10/04/2011. Two-view chest 08/02/2011. Two-view chest 09/18/2006.  Findings: Cardiac enlargement is stable.  There is no edema or effusion to suggest failure.  A granuloma in the lingula is stable. Right lateral pleural thickening is stable.  A superior endplate fracture at L1 and inferior endplate fracture of T12 are new since July.  IMPRESSION:  1.  Stable cardiomegaly without failure. 2.  New superior endplate fracture at L1 and inferior endplate fracture at T12.  There is focal kyphosis  at this level.   Original Report Authenticated By: Jamesetta Orleans. MATTERN, M.D.    Mr Lumbar Spine W Wo Contrast  10/30/2011  *RADIOLOGY REPORT*  Clinical Data: Increased back pain with nausea and vomiting. History of T11-T12 diskitis/osteomyelitis status post 6-week course of antibiotics. Left T12 costotransversectomy with paraspinal biopsy 08/08/2010.  MRI LUMBAR SPINE WITHOUT AND WITH CONTRAST  Technique:  Multiplanar and multiecho pulse sequences of the lumbar spine were obtained without and with intravenous contrast.  Contrast: 20mL MULTIHANCE GADOBENATE DIMEGLUMINE 529 MG/ML IV SOLN  Comparison: Thoracic MRI 07/18/2011, abdominal pelvic CT 06/30/2011 and lumbar MRI 06/27/2011.  Findings: Current examination includes the lower thoracic and lumbar spine, extending from mid T8 through the mid sacrum on the sagittal images.  The changes of diskitis/osteomyelitis at T11-T12 have significantly worsened compared with the prior examination.  There is endplate destruction with mild loss of disc height.  There is extensive marrow edema throughout the T11 and T12 vertebral bodies and its posterior elements.  There is irregular enhancing fluid within the T11-T12 disc. There is a peripherally enhancing fluid collection posteriorly in the surgical bed on the left, measuring  2.3 x 2.0 cm transverse.  There is extensive enhancing paraspinal inflammatory change anteriorly around the T11 and T12 vertebral bodies extending asymmetrically to the left where there is a small fluid collection. There is a small amount of epidural inflammation, but no epidural abscess or significant mass effect on the thecal sac is seen.  The additional disc spaces appear normal.  There is no evidence of acute fracture.  No abnormal intradural enhancement is seen.  The conus medullaris extends to the L1-L2 disc space level.  There is a small right pleural effusion.  Bilateral renal cysts are partially imaged.  Mild spondylosis in the lower lumbar  spine appears unchanged.  IMPRESSION: Significant worsening of diskitis/osteomyelitis at T11-T12 with extensive paraspinal inflammatory change and paraspinal abscesses on the left.  No significant epidural abscess or mass effect on the thecal sac identified.  These results were called by telephone on 10/30/2011 at 1415 hours to Dr. Gonzella Lex, who verbally acknowledged these results.   Original Report Authenticated By: Gerrianne Scale, M.D.    Nm Hepatobiliary  10/05/2011  *RADIOLOGY REPORT*  Clinical Data: Intractable nausea and vomiting, known cholelithiasis  NUCLEAR MEDICINE HEPATOHBILIARY INCLUDE GB  Radiopharmaceutical:  5.3 mCi technetium 69m Choletec  Comparison: CT abdomen pelvis of 06/30/2011  Findings: The patient was injected with 5.3 mCi of technetium 8m Choletec intravenously and imaging over the upper abdomen was performed.  Over the first 50 minutes, the radionuclide is excreted by the kidney and there is visualization of the common bile duct and small bowel.  However, the gallbladder fills on a delayed basis at approximately 1 hour after beginning the study.  This is nonspecific but can be seen with chronic cholecystitis.  However this visualization of the gallbladder does indicate cystic duct patency.  IMPRESSION: Delayed visualization of the gallbladder may indicate chronic cholecystitis, but the cystic duct is patent.   Original Report Authenticated By: Juline Patch, M.D.    US Abdomen Complete  10/27/2011  *RADIOLOGY REPORT*  Clinical Data:  Abdominal pain.  Vomiting.  History of gallstones.  COMPLETE ABDOMINAL ULTRASOUND  Comparison:  Plain films of 10/04/2011.  CT of 06/30/2011.  No prior ultrasound.  Findings:  Gallbladder:  Gallbladder stones at the 1.3 cm.  No wall thickening or pericholecystic fluid. Sonographic Murphy's sign was not elicited.  Common bile duct: Normal, 2 mm.  Liver: Increased echogenicity.  IVC: Negative  Pancreas:  Poorly visualized due to overlying bowel gas.   Spleen:  Normal in size and echogenicity.  Right Kidney:  12.2 cm.  Right renal upper pole cysts measuring 1.4 and 1.7 cm respectively. No hydronephrosis.  Left Kidney:  14.9 cm. No hydronephrosis.  Dominant cyst measuring 8.9 cm.  Abdominal aorta:  Poorly visualized due to overlying bowel gas.  No aneurysm or ascites identified.  IMPRESSION: No acute process or explanation for vomiting or abdominal pain.  Cholelithiasis without cholecystitis.  Hepatic steatosis.   Original Report Authenticated By: Consuello Bossier, M.D.    Nm Hepato W/eject Fract  10/28/2011  *RADIOLOGY REPORT*  Clinical Data:  Abdominal pain and nausea vomiting. Cholelithiasis.  NUCLEAR MEDICINE HEPATOBILIARY IMAGING WITH GALLBLADDER EF  Technique:  Sequential images of the abdomen were obtained out to 60 minutes following intravenous administration of radiopharmaceutical. After oral ingestion of Ensure, gallbladder ejection fraction was determined.  Radiopharmaceutical:  5.5 mCi Tc-41m Choletec  Comparison: None.  Findings: Prompt radiopharmaceutical uptake by the liver is seen. The liver is normal in appearance.  Prompt biliary excretion activity is  demonstrated.  Gallbladder activity is seen initially on the 15-minute image.  Biliary activity reaches small bowel by 30 minutes.  Gallbladder ejection fraction:  20%. Normal gallbladder ejection fraction with Ensure is greater than 33%.  The patient did not experience symptoms after oral ingestion of Ensure.  IMPRESSION:  1.  No evidence of cystic duct or biliary obstruction. 2.  Decreased gallbladder ejection fraction of 20%.   Original Report Authenticated By: Danae Orleans, M.D.    Ir Fl Guide Spinal/si Jt Inj Right  11/02/2011  *RADIOLOGY REPORT*  Clinical data:  Progressive diskitis T11-12 with paraspinal abscess  THORACIC DISC ASPIRATION UNDER FLUOROSCOPY  Technique and findings: The procedure, risks (including but not limited to bleeding, infection, organ damage), benefits, and  alternatives were explained to the patient.  Questions regarding the procedure were encouraged and answered.  The patient understands and consents to the procedure.The patient was placed prone and an appropriate skin shows identified.  Site was marked, prepped with Betadine, draped in usual sterile fashion, infiltrated locally with 1% lidocaine. Maximal barrier sterile technique was utilized including caps, mask, sterile gowns, sterile gloves, sterile drape, hand hygiene and skin antiseptic.  Intravenous Fentanyl and Versed were administered as conscious sedation during continuous cardiorespiratory monitoring by the radiology RN, with a total moderate sedation time of 20 minutes.  Under intermittent fluoroscopic guidance, a 16 gauge trocar needle was advanced into the left paraspinal region and a scant amount of bloody fluid was aspirated.  The needle was advanced into the T11- 12 interspace and approximately 5 ml of bloody fluid were aspirated.  The sample was sent for routine Gram stain and culture. The patient tolerated the procedure well.  No immediate complication.  IMPRESSION: Technically successful left paraspinal and thoracic T11-12 disc aspiration under fluoroscopy   Original Report Authenticated By: Osa Craver, M.D.    Dg Chest Port 1 View  10/04/2011  *RADIOLOGY REPORT*  Clinical Data: Intractable nausea, vomiting.  PORTABLE CHEST - 1 VIEW  Comparison: 08/08/2011  Findings: Cardiomegaly.  Calcified right granuloma and right hilar/mediastinal lymph nodes.  Scarring in the lung bases bilaterally.  No acute infiltrates or effusions.  IMPRESSION: Cardiomegaly/chronic changes.  No active disease.   Original Report Authenticated By: Cyndie Chime, M.D.    Dg Abd Portable 1v  10/04/2011  *RADIOLOGY REPORT*  Clinical Data: Nausea and vomiting  PORTABLE ABDOMEN - 1 VIEW  Comparison: None.  Findings: The bowel gas pattern is within normal limits.  No obstruction or free air is seen on this supine  examination.  There are laminated gallstones in the right upper quadrant.  There is lumbar levoscoliosis. There is a calcified granuloma in the right lower lobe region.  There are also right hilar and subcarinal calcified lymph nodes, consistent with prior granulomatous disease.  IMPRESSION: Nonspecific gas pattern.  Laminated gallstones in the right upper quadrant.   Original Report Authenticated By: Arvin Collard. WOODRUFF III, M.D.     Microbiology: Recent Results (from the past 240 hour(s))  CULTURE, BLOOD (ROUTINE X 2)     Status: Normal   Collection Time   10/27/11  3:30 PM      Component Value Range Status Comment   Specimen Description BLOOD RIGHT HAND   Final    Special Requests BOTTLES DRAWN AEROBIC AND ANAEROBIC 5CC   Final    Culture  Setup Time 10/27/2011 22:26   Final    Culture NO GROWTH 5 DAYS   Final    Report Status  11/02/2011 FINAL   Final   CULTURE, BLOOD (ROUTINE X 2)     Status: Normal   Collection Time   10/27/11  4:05 PM      Component Value Range Status Comment   Specimen Description BLOOD RIGHT ARM   Final    Special Requests BOTTLES DRAWN AEROBIC AND ANAEROBIC 5CC   Final    Culture  Setup Time 10/27/2011 22:26   Final    Culture NO GROWTH 5 DAYS   Final    Report Status 11/02/2011 FINAL   Final   BODY FLUID CULTURE     Status: Normal (Preliminary result)   Collection Time   11/02/11  2:11 PM      Component Value Range Status Comment   Specimen Description BONE   Final    Special Requests NONE   Final    Gram Stain     Final    Value: MODERATE WBC PRESENT, PREDOMINANTLY PMN     NO ORGANISMS SEEN   Culture PENDING   Incomplete    Report Status PENDING   Incomplete      Labs: Basic Metabolic Panel:  Lab 11/03/11 0981 11/01/11 0530 10/31/11 0510 10/30/11 0601 10/29/11 0552 10/27/11 1910  NA 133* 135 138 135 136 --  K 3.2* 3.3* 3.0* 2.7* 3.0* --  CL 99 101 104 101 101 --  CO2 24 23 23 22 21  --  GLUCOSE 100* 103* 110* 144* 121* --  BUN 19 18 11 8 8  --    CREATININE 1.10 1.21 0.80 0.82 0.84 --  CALCIUM 8.8 9.1 9.0 9.2 9.2 --  MG 1.8 -- -- 1.7 -- 1.8  PHOS -- -- 3.1 -- -- --   Liver Function Tests:  Lab 10/29/11 0552 10/27/11 1305  AST 16 18  ALT 9 12  ALKPHOS 115 139*  BILITOT 0.3 0.4  PROT 7.7 8.4*  ALBUMIN 3.0* 3.3*    Lab 10/27/11 1305  LIPASE 30  AMYLASE --   No results found for this basename: AMMONIA:5 in the last 168 hours CBC:  Lab 11/03/11 0515 11/01/11 0530 10/31/11 0510 10/30/11 0601 10/29/11 0552 10/27/11 1315  WBC 8.4 9.5 14.6* 15.2* 16.7* --  NEUTROABS -- 6.6 12.1* 12.4* -- 13.5*  HGB 10.0* 12.0* 12.9* 12.8* 12.5* --  HCT 30.5* 36.9* 39.0 38.9* 38.7* --  MCV 80.3 80.0 79.3 79.6 79.6 --  PLT 210 290 341 365 332 --   Cardiac Enzymes: No results found for this basename: CKTOTAL:5,CKMB:5,CKMBINDEX:5,TROPONINI:5 in the last 168 hours BNP: BNP (last 3 results) No results found for this basename: PROBNP:3 in the last 8760 hours CBG:  Lab 10/28/11 0746  GLUCAP 97   Time spent: 40 minutes  Signed:  Arlo Buffone A  [facility] 11/03/2011, 11:18 AM

## 2011-11-03 NOTE — Discharge Summary (Signed)
Physician Discharge Summary  Patient ID: John Brandt MRN: [id number] DOB/AGE: [birth year] 61 y.o.  Admit date: 10/02/2011 Discharge date: 10/08/2011  Admission Diagnoses: Nausea/Vomiting and shaking chills  Discharge Diagnoses:  Active Problems:  Nausea & vomiting  Hypokalemia  ARF (acute renal failure)  Narcotic withdrawal  Dehydration  Tachycardia  Chronic low back pain  Hypertension  Bradycardia  Low magnesium levels   Discharged Condition: good  Hospital Course:  This is a very pleasant gentleman who was admitted with nausea, vomiting, . 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  The patient however denied any increase in the back pain and stated that his pain was stable from the last few months. There was a concern that nausea vomiting and chills may be related to the infection that was treated and his back however the patient did not want to undergo an MRI during this hospitalization as he states he spoke with his primary neurosurgeon who felt that the pain was not related to previous infection.   The patient had been put on fentanyl Duragesic patch and had received several narcotic medications. He decided that he did not want to continue on these medications and decided to take himself off of that. In the absence of any clinical indicators of infection in the presence of a negative HIDA scan, it was felt that the patient's nausea vomiting and chills were likely secondary to withdrawal from narcotics. The patient in the last 48 hours has had no further nausea vomiting or chills and he has been able tolerated the diet very well. The patient further related that his major issue was not the pain but rather than nausea vomiting and chills that he has had since taking himself off of the narcotics.  Nevertheless the patient had a resolution of his  symptoms after about 72 hours of being off of the narcotics. The patient was offered Ultram for pain control but refused to the Ultram as an alternative to narcotics. He felt that Naprosyn and Lidoderm topical would be a viable alternative to narcotic. The patient is being discharged home with Lidoderm patch to be applied on a daily basis and Naprosyn to be taken as needed.  On admission to the hospital the patient had acute renal failure which is felt to be secondary to prerenal state. With IV hydration this has resolved and the patient's renal function is now at baseline at the time of discharge.   The patient has some tachycardia initially associated with this pain and his presumed symptoms of withdrawal. However this has resolved completely the time of discharge the patient's heart rates are ranging in the 50s to 60s.  The patient is evaluated by physical therapy and performed well with the use of a walker. The patient is being discharged home with a walker.  At the time of discharge the patient is in stable condition.  Consults: None  Significant Diagnostic Studies:  NUCLEAR MEDICINE HEPATOHBILIARY INCLUDE GB  Radiopharmaceutical: 5.3 mCi technetium 54m Choletec  Comparison: CT abdomen pelvis of 06/30/2011  Findings: The patient was injected with 5.3 mCi of technetium 60m  Choletec intravenously and imaging over the upper abdomen was  performed. Over the first 50 minutes, the radionuclide is excreted  by the kidney and there is visualization of the common bile duct  and small bowel. However, the gallbladder fills on a delayed basis  at  approximately 1 hour after beginning the study. This is  nonspecific but can be seen with chronic cholecystitis. However  this visualization of the gallbladder does indicate cystic duct  patency.  IMPRESSION:  Delayed visualization of the gallbladder may indicate chronic  cholecystitis, but the cystic duct is patent.    Discharge Exam: General:  Alert, awake, oriented x3. Very energetic-appearing today.  Vital Signs:BP 145/84, pulse 50, T 98.6 F (37 C), Oral, RR 18, height 6\' 1"  (1.854 m), weight 119.704 kg (263 lb 14.4 oz), SpO2 96.00%. HEENT: Anthony/AT PEERL, EOMI  Neck: Trachea midline, no masses, no thyromegal,y no JVD, no carotid bruit  OROPHARYNX: Moist, No exudate/ erythema/lesions.  Heart: Regular rate and rhythm with occasional PVCs on telemetry  Lungs: Clear to auscultation,.  Abdomen: Soft, nontender, nondistended, positive bowel sounds.  Musculoskeletal: No warm swelling or erythema around joints, no significant spinal tenderness noted.   Disposition: 01-Home or Self Care  Discharge Orders    Future Appointments: Provider: Department: Dept Phone: Center:   11/30/2011 9:45 AM Randall Hiss, MD [facility] [phone] [facility]     Future Orders Please Complete By Expires   Diet - low sodium heart healthy      Activity as tolerated - No restrictions      Walker           Medication List     As of 11/03/2011 10:07 AM    STOP taking these medications         fentaNYL 25 MCG/HR   Commonly known as: DURAGESIC - dosed mcg/hr      TAKE these medications         acetaminophen 500 MG tablet   Commonly known as: TYLENOL   Take 1,000 mg by mouth every 6 (six) hours as needed. For pain      amLODipine 10 MG tablet   Commonly known as: NORVASC   Take 10 mg by mouth daily.      aspirin EC 81 MG tablet   Take 81 mg by mouth daily.      atorvastatin 10 MG tablet   Commonly known as: LIPITOR   Take 10 mg by mouth daily.      cloNIDine 0.2 MG tablet   Commonly known as: CATAPRES   Take 1 tablet (0.2 mg total) by mouth 3 (three) times daily. Hold unless BP > 140 when checked      lidocaine 5 %   Commonly known as: LIDODERM   Place 1 patch onto the skin daily. Remove & Discard patch within 12 hours or as directed by MD      metoprolol succinate 50 MG 24 hr tablet   Commonly known as: TOPROL-XL    Take 50 mg by mouth daily. Take with or immediately following a meal.      multivitamin with minerals Tabs   Take 1 tablet by mouth daily.      naproxen 500 MG tablet   Commonly known as: NAPROSYN   Take 1 tablet (500 mg total) by mouth every 12 (twelve) hours as needed ( Pain-Take with food ).      ondansetron 8 MG tablet   Commonly known as: ZOFRAN   Take 8 mg by mouth every 8 (eight) hours as needed. For nausea.      polyethylene glycol packet   Commonly known as: MIRALAX / GLYCOLAX   Take 17 g by mouth daily as needed. For constipation.      potassium chloride  SA 20 MEQ tablet   Commonly known as: K-DUR,KLOR-CON   Take 40 mEq by mouth 3 (three) times daily.      spironolactone 25 MG tablet   Commonly known as: ALDACTONE   Take 25 mg by mouth daily.      triamcinolone 55 MCG/ACT nasal inhaler   Commonly known as: NASACORT   Place 2 sprays into the nose daily. 2 sprays in each nostril daily      Vitamin D (Ergocalciferol) 50000 UNITS Caps   Commonly known as: DRISDOL   Take 50,000 Units by mouth every 7 (seven) days. Taken on Saturdays.        total time for discharge including face-to-face time and decision making greater than 30 minutes  Signed: MATTHEWS,MICHELLE A. 11/03/2011, 10:07 AM

## 2011-11-03 NOTE — Progress Notes (Signed)
Subjective: No specific concerns. Denies any chest pain or shortness of breath. Feeling better would like to go home today.  Objective: Vital signs in last 24 hours: Filed Vitals:   11/02/11 1658 11/02/11 2123 11/03/11 0653 11/03/11 0745  BP: 151/95 125/79 110/71 156/97  Pulse: 60 59 62 68  Temp:  98.4 F (36.9 C) 98.9 F (37.2 C) 98.3 F (36.8 C)  TempSrc:  Oral Oral Oral  Resp: 18 18 16 18   Height:      Weight:      SpO2: 97% 96% 94% 96%   Weight change:   Intake/Output Summary (Last 24 hours) at 11/03/11 1028 Last data filed at 11/03/11 0500  Gross per 24 hour  Intake    486 ml  Output      0 ml  Net    486 ml    Physical Exam: General: Awake, Oriented, No acute distress. HEENT: EOMI. Neck: Supple CV: S1 and S2 Lungs: Clear to ascultation bilaterally Abdomen: Soft, Nontender, Nondistended, +bowel sounds. Ext: Good pulses. Trace edema.  Lab Results: Basic Metabolic Panel:  Lab 11/03/11 7829 11/01/11 0530 10/31/11 0510 10/30/11 0601 10/29/11 0552 10/27/11 1910  NA 133* 135 138 135 136 --  K 3.2* 3.3* 3.0* 2.7* 3.0* --  CL 99 101 104 101 101 --  CO2 24 23 23 22 21  --  GLUCOSE 100* 103* 110* 144* 121* --  BUN 19 18 11 8 8  --  CREATININE 1.10 1.21 0.80 0.82 0.84 --  CALCIUM 8.8 9.1 9.0 9.2 9.2 --  MG 1.8 -- -- 1.7 -- 1.8  PHOS -- -- 3.1 -- -- --   Liver Function Tests:  Lab 10/29/11 0552 10/27/11 1305  AST 16 18  ALT 9 12  ALKPHOS 115 139*  BILITOT 0.3 0.4  PROT 7.7 8.4*  ALBUMIN 3.0* 3.3*    Lab 10/27/11 1305  LIPASE 30  AMYLASE --   No results found for this basename: AMMONIA:5 in the last 168 hours CBC:  Lab 11/03/11 0515 11/01/11 0530 10/31/11 0510 10/30/11 0601 10/29/11 0552 10/27/11 1315  WBC 8.4 9.5 14.6* 15.2* 16.7* --  NEUTROABS -- 6.6 12.1* 12.4* -- 13.5*  HGB 10.0* 12.0* 12.9* 12.8* 12.5* --  HCT 30.5* 36.9* 39.0 38.9* 38.7* --  MCV 80.3 80.0 79.3 79.6 79.6 --  PLT 210 290 341 365 332 --   Cardiac Enzymes: No results found for  this basename: CKTOTAL:5,CKMB:5,CKMBINDEX:5,TROPONINI:5 in the last 168 hours BNP (last 3 results) No results found for this basename: PROBNP:3 in the last 8760 hours CBG:  Lab 10/28/11 0746  GLUCAP 97   No results found for this basename: HGBA1C:5 in the last 72 hours Other Labs: No components found with this basename: POCBNP:3 No results found for this basename: DDIMER:2 in the last 168 hours No results found for this basename: CHOL:2,HDL:2,LDLCALC:2,TRIG:2,CHOLHDL:2,LDLDIRECT:2 in the last 168 hours No results found for this basename: TSH,T4TOTAL,FREET3,T3FREE,FREET4,THYROIDAB in the last 168 hours No results found for this basename: VITAMINB12:2,FOLATE:2,FERRITIN:2,TIBC:2,IRON:2,RETICCTPCT:2 in the last 168 hours  Micro Results: Recent Results (from the past 240 hour(s))  CULTURE, BLOOD (ROUTINE X 2)     Status: Normal   Collection Time   10/27/11  3:30 PM      Component Value Range Status Comment   Specimen Description BLOOD RIGHT HAND   Final    Special Requests BOTTLES DRAWN AEROBIC AND ANAEROBIC 5CC   Final    Culture  Setup Time 10/27/2011 22:26   Final    Culture NO GROWTH  5 DAYS   Final    Report Status 11/02/2011 FINAL   Final   CULTURE, BLOOD (ROUTINE X 2)     Status: Normal   Collection Time   10/27/11  4:05 PM      Component Value Range Status Comment   Specimen Description BLOOD RIGHT ARM   Final    Special Requests BOTTLES DRAWN AEROBIC AND ANAEROBIC 5CC   Final    Culture  Setup Time 10/27/2011 22:26   Final    Culture NO GROWTH 5 DAYS   Final    Report Status 11/02/2011 FINAL   Final   BODY FLUID CULTURE     Status: Normal (Preliminary result)   Collection Time   11/02/11  2:11 PM      Component Value Range Status Comment   Specimen Description BONE   Final    Special Requests NONE   Final    Gram Stain     Final    Value: MODERATE WBC PRESENT, PREDOMINANTLY PMN     NO ORGANISMS SEEN   Culture PENDING   Incomplete    Report Status PENDING   Incomplete      Studies/Results: Ir Fl Guide Spinal/si Jt Inj Right  11/02/2011  *RADIOLOGY REPORT*  Clinical data:  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  Questions regarding the procedure were encouraged and answered.  The patient understands and consents to the procedure.The patient was placed prone and an appropriate skin shows identified.  Site was marked, prepped with Betadine, draped in usual sterile fashion, infiltrated locally with 1% lidocaine. Maximal barrier sterile technique was utilized including caps, mask, sterile gowns, sterile gloves, sterile drape, hand hygiene and skin antiseptic.  Intravenous Fentanyl and Versed were administered as conscious sedation during continuous cardiorespiratory monitoring by the radiology RN, with a total moderate sedation time of 20 minutes.  Under intermittent fluoroscopic guidance, a 16 gauge trocar needle was advanced into the left paraspinal region and a scant amount of bloody fluid was aspirated.  The needle was advanced into the T11- 12 interspace and approximately 5 ml of bloody fluid were aspirated.  The sample was sent for routine Gram stain and culture. The patient tolerated the procedure well.  No immediate complication.  IMPRESSION: Technically successful left paraspinal and thoracic T11-12 disc aspiration under fluoroscopy   Original Report Authenticated By: Osa Craver, M.D.     Medications: I have reviewed the patient's current medications. Scheduled Meds:    . amLODipine  10 mg Oral Daily  . buPROPion  75 mg Oral q morning - 10a   Followed by  . buPROPion  150 mg Oral Daily  . cloNIDine  0.2 mg Oral TID  . fluticasone  2 spray Each Nare Daily  . lidocaine  1 patch Transdermal Q24H  . metoCLOPramide (REGLAN)  injection  10 mg Intravenous Q6H  . metoprolol succinate  100 mg Oral Daily  . multivitamin with minerals  1 tablet Oral Daily  . pantoprazole  40 mg Oral BID  . sucralfate  1 g Oral Q6H  . vancomycin  1,000 mg Intravenous Q12H   Continuous Infusions:  PRN Meds:.feeding supplement, fentaNYL, hydrALAZINE, midazolam, ondansetron (ZOFRAN) IV, ondansetron, oxyCODONE-acetaminophen, promethazine, senna-docusate, sodium chloride, zolpidem, DISCONTD: oxyCODONE-acetaminophen  Assessment/Plan: Nausea and vomiting  EGD done shows signs of severe distal esophagitis,  duodenitis and 2 small gastric ulcer, but as per GI apprears to be secondary to intractable vomiting rather than cause of it. Biopsy sent for H pylori negative, shows duodenitis. Continue phenergan and reglan q 6hr prn . Now much improved after IV vancomycin was started. Continue PPI. US abdomen shows cholelithiasis. 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 Repeated on 10/25 shows decreased GB ejection fraction of 20%. General surgery, given improvement in his symptoms recommended elective cholecystectomy in the near future once other issues are resolved to see if gallstones are really symptomatic.   Gastric ulcer, esophagitis and duodenitis  As noted above on EGD, related to persistent nausea and vomiting and taking motrin for back pain. Hpylori negative. Continue PPI on discharge.  Anion gap metabolic acidosis  Associated with nausea and vomiting. Now resolved.   MSSA paraspinal abscess  Completed 6 weeks of abx (cefazolin followed by vanco) in August. Patient still has low back pain. MRI done to evaluate the paraspinal abscess as follow up shows worsening diskitis at T12 with worsening left paraspinal abscess. Patient had  fluro guided paraspinal abscess aspirated on 11/02/2011, ID will followup on the cultures after discharge. Dr. Gonzella Lex discussed with Dr Jordan Likes who reviewed the film and considers this a true infection vs a reactive change  from previous infection and surgery. ID consulted and recommended starting IV vancomycin for 6 weeks and will change antibiotics depending of fluro aspiration cultures. Elevated CRP and ESR. Pain control with prn percocet. PICC line placed on 11/02/2011 and pharmacy dosing his vancomycin. Informed CM to set up home care. Per ID will need weekly CBC, BMET, and vancomycin troughs, results to be sent to ID clinic, Re: Dr. Drue Second. Patient will need vancomycin trough to be arranged tomorrow at home by home health.  Hypokalemia  Replace as needed. Magnesium normal.  HTN Elevated BP noted. Increased dose of toprol. Continue amlodipine. Increased prn hydralazine dose. Appears patient is on clonidine at home and was not getting it here. Have resumed it and better controlled now.   Depression Denies any suicidal thoughts or ideation. Wife noted that given his complicated medical course over the last few months patient has been feeling depressed and requested psychiatry consultation.  Psychiatry consultation obtained.  Anemia Likely due to chronic disease. Hemoglobin stable.  Code Status: Full code  Family Communication: patient and wife updated  Disposition Plan: PICC line placed on 11/02/2011. DC today with outpatient followup with [facility] and ID.   Consultants:  Juanda Chance (GI)  [facility]  Neurosurgery (Dr. Jordan Likes-- discussed over the phone on 10/27)  ID (Dr Ilsa Iha)  Psychiatry  Procedures:  EGD on 10/25  HIDA scan on 10/25  MRI spine 10/27   Antibiotics:  IV vanco (10/27)   LOS: 7 days  Carlous Olivares A, MD 11/03/2011, 10:28 AM

## 2011-11-03 NOTE — Progress Notes (Signed)
Provided Pt with outpt mental health information.  No further psych CSW needs identified.  Pt to be d/c'd.  Providence Crosby, LCSWA Clinical Social Work [phone]

## 2011-11-03 NOTE — Evaluation (Signed)
Physical Therapy Evaluation Patient Details Name: John Brandt MRN: [id number] DOB: [birth year] Today's Date: 11/03/2011 Time: 1350-1400 PT Time Calculation (min): 10 min  PT Assessment / Plan / Recommendation Clinical Impression  pt admitted with intractible nausea and vomiting and found to have esophagitis, duodenitis and gastric ulcers.  Pt presents with some decondtitioning due to current illness and will benefit from continued PT at home to continue strengthening Pt anxious to be discharging home today    PT Assessment  All further PT needs can be met in the next venue of care    Follow Up Recommendations  Home health PT    Does the patient have the potential to tolerate intense rehabilitation      Barriers to Discharge        Equipment Recommendations  None recommended by PT    Recommendations for Other Services     Frequency      Precautions / Restrictions Precautions Precautions: Back Precaution Comments: pt has infection in back. Restrictions Weight Bearing Restrictions: No   Pertinent Vitals/Pain Pt c/o back pain with mobility      Mobility  Bed Mobility Bed Mobility: Not assessed;Rolling Right;Rolling Left Rolling Right: 4: Min guard Rolling Left: 4: Min guard Supine to Sit: 4: Min assist Transfers Transfers: Sit to Stand;Stand to Sit Sit to Stand: With armrests;From chair/3-in-1;4: Min assist;Other (comment) (pt limited by pain with sit to stand) Stand to Sit: To chair/3-in-1;With upper extremity assist;With armrests;4: Min assist Ambulation/Gait Ambulation/Gait Assistance: 5: Supervision Assistive device: Rolling walker;None Ambulation/Gait Assistance Details: Pt has been used to having pain with mobility.  His wife states that he is a little weaker than he was on admission  Gait Pattern: Within Functional Limits Gait velocity: decrease  General Gait Details: pt is limited in gait, but is anxious to d/c to home Stairs: No Stairs Assistance:   (verbally reviewed.  wife states they will be able to manage)    Shoulder Instructions     Exercises Other Exercises Other Exercises: Pt issued home program of beginning core and LE strengthenin exercises to continue on with HHPT.    PT Diagnosis: Difficulty walking;Acute pain;Generalized weakness  PT Problem List: Decreased strength;Decreased activity tolerance;Decreased mobility;Pain PT Treatment Interventions:     PT Goals    Visit Information  Last PT Received On: 11/03/11 Assistance Needed: +1    Subjective Data  Subjective: I walk with a cane Patient Stated Goal: to go home ASAP   Prior Functioning  Home Living Lives With: Spouse Available Help at Discharge: Family Type of Home: House Home Access: Stairs to enter Secretary/administrator of Steps: 2 Entrance Stairs-Rails: None Bathroom Shower/Tub: Psychologist, counselling;Other (comment) (Grab bars) Bathroom Toilet:  (Comfort height toilets in house) Bathroom Accessibility: Yes Home Adaptive Equipment: Straight cane;Grab bars in shower;Other (comment) (Comfort height toilets) Prior Function Level of Independence: Independent with assistive device(s) (with straight cane) Able to Take Stairs?: Yes Driving: Yes Vocation: Full time employment Communication Communication: No difficulties Dominant Hand: Right    Cognition  Overall Cognitive Status: Appears within functional limits for tasks assessed/performed Arousal/Alertness: Awake/alert Orientation Level: Appears intact for tasks assessed Behavior During Session: Correct Care Of [place] for tasks performed    Extremity/Trunk Assessment Right Lower Extremity Assessment RLE ROM/Strength/Tone: Within functional levels RLE Sensation: WFL - Light Touch RLE Coordination: WFL - gross/fine motor Left Lower Extremity Assessment LLE ROM/Strength/Tone: Within functional levels LLE Sensation: WFL - Light Touch LLE Coordination: WFL - gross/fine motor Trunk Assessment Trunk Assessment: Normal Trunk  Exceptions: Pt  has obese abdomen and tends to hold spine in midline   Balance    End of Session PT - End of Session Activity Tolerance: Patient limited by fatigue Patient left: in chair Nurse Communication: Mobility status  GP     Donnetta Hail 11/03/2011, 3:19 PM

## 2011-11-04 ENCOUNTER — Telehealth: Payer: Self-pay | Admitting: *Deleted

## 2011-11-04 NOTE — Telephone Encounter (Signed)
Call from [facility] lab on patient's bone culture results, showed a few staff aureus. Darl Pikes from [place] given Dr. Feliz Beam page # [phone]. Wendall Mola CMA

## 2011-11-04 NOTE — Telephone Encounter (Signed)
Received a call from micro that states that his recent disc aspirate is growing staph aureus. Sensitivities to come out in next 24-48hr. Patient remains on vancomycin. We will decide if need to keep on vanco vs. changing to daptomycin. Plan on treating for 6-8 wks.

## 2011-11-05 LAB — BODY FLUID CULTURE

## 2011-11-07 ENCOUNTER — Telehealth: Payer: Self-pay | Admitting: *Deleted

## 2011-11-07 NOTE — Telephone Encounter (Signed)
Hospitalist provided Percocet rx for pt at discharge.  Wife asking about further refills.  Pt is also seen by Dr. Dutch Quint for his back.  F/U appt with Dr. Dutch Quint scheduled for 11/23/11.  RN advised wife to call Dr. Dutch Quint for pain rx refills.  Wife verbalized understanding and will call Dr. Ethelene Browns office about pain refills.

## 2011-11-08 ENCOUNTER — Telehealth: Payer: Self-pay | Admitting: Internal Medicine

## 2011-11-08 ENCOUNTER — Other Ambulatory Visit: Payer: Self-pay | Admitting: *Deleted

## 2011-11-08 DIAGNOSIS — A4901 Methicillin susceptible Staphylococcus aureus infection, unspecified site: Secondary | ICD-10-CM

## 2011-11-08 MED ORDER — SODIUM CHLORIDE 0.9 % IV SOLN: 900.0000 mg | INTRAVENOUS | Status: DC

## 2011-11-08 NOTE — Telephone Encounter (Signed)
MSSA in repeat aspirate. Previously on vancomycin due to cefazolin allergy. Will change his regimen to daptomycin 8mg /kg, to receive 900mg  daily. Will ask to get baseline CK, then check CK and BMP on weekly basis. Changing therapy with the thought that daptomycin will be better at treating remaining part of infection

## 2011-11-08 NOTE — Progress Notes (Signed)
error 

## 2011-11-12 LAB — TISSUE CULTURE

## 2011-11-29 LAB — FUNGUS CULTURE W SMEAR

## 2011-11-30 ENCOUNTER — Ambulatory Visit (INDEPENDENT_AMBULATORY_CARE_PROVIDER_SITE_OTHER): Payer: BC Managed Care – PPO | Admitting: Infectious Disease

## 2011-11-30 ENCOUNTER — Encounter: Payer: Self-pay | Admitting: Infectious Disease

## 2011-11-30 VITALS — BP 115/80 | HR 74 | Temp 98.1°F | Ht 73.0 in | Wt 255.0 lb

## 2011-11-30 DIAGNOSIS — M869 Osteomyelitis, unspecified: Secondary | ICD-10-CM

## 2011-11-30 DIAGNOSIS — M519 Unspecified thoracic, thoracolumbar and lumbosacral intervertebral disc disorder: Secondary | ICD-10-CM

## 2011-11-30 DIAGNOSIS — L270 Generalized skin eruption due to drugs and medicaments taken internally: Secondary | ICD-10-CM

## 2011-11-30 DIAGNOSIS — M4640 Discitis, unspecified, site unspecified: Secondary | ICD-10-CM

## 2011-11-30 DIAGNOSIS — M8618 Other acute osteomyelitis, other site: Secondary | ICD-10-CM

## 2011-11-30 DIAGNOSIS — M4620 Osteomyelitis of vertebra, site unspecified: Secondary | ICD-10-CM

## 2011-11-30 DIAGNOSIS — A4101 Sepsis due to Methicillin susceptible Staphylococcus aureus: Secondary | ICD-10-CM

## 2011-11-30 MED ORDER — DOXYCYCLINE HYCLATE 100 MG PO TABS: 100.0000 mg | ORAL_TABLET | Freq: Two times a day (BID) | ORAL | Status: DC

## 2011-11-30 NOTE — Progress Notes (Signed)
Subjective:    Patient ID: John Brandt, male    DOB: 1950-09-08, 61 y.o.   MRN: [id number]  HPI  61yo Male with PMHX of OSA, NASH, who had new onset back pain found to have left side inflammation/mass of the T12 vertebra extending along the T11-T12 paraspinal soft tissues. elective open biopsy by Dr. Dutch Quint on 08/08/11 : Left T12 costotransversectomy with paraspinal biopsy. Micro showed MSSA. He hadbeen on cefazolin 2gm IV Q 8hr for approximately 5 wks. . He noticed having increasing nausea/vomiting and dry heaving and then a severe maculopapular rash. Cefazolin was changed to vancomycin  completed 6 weeks of postoperative IV antibiotics. He completed his course of therapy and was observed off antibiotics. Unfortunately his infection recurred with evidence radiographically of worsening discitis and new paraspinal abscess. This was aspirated off antibiotics and again yielded methicillin sensitive staph Cox aureus. The patient was started on IV vancomycin in the hospital on October 30 and then changed to intravenous daptomycin which she has been on since then. He is due to complete 6 weeks of therapy with daptomycin on December 13. Back pain has improved slightly though it is still quite severe. It is exacerbated by recent physical therapy session. He is without fevers chills or other systemic symptoms besides the back pain. I plan on checking a sedimentation rate towards the end of his course of therapy. I will also like to repeat imaging of his thoracic spine with MRI next 2 weeks. I will plan on changing him over to oral doxycycline for prolonged likely at least 6 month course following completion of his intravenous antibiotics. I spent greater than 45 minutes with the patient including greater than 50% of time in face to face counsel of the patient and in coordination of their care.      Review of Systems  Constitutional: Negative for fever, chills, diaphoresis, activity change, appetite change,  fatigue and unexpected weight change.  HENT: Negative for congestion, sore throat, rhinorrhea, sneezing, trouble swallowing and sinus pressure.   Eyes: Negative for photophobia and visual disturbance.  Respiratory: Negative for cough, chest tightness, shortness of breath, wheezing and stridor.   Cardiovascular: Negative for chest pain, palpitations and leg swelling.  Gastrointestinal: Negative for nausea, vomiting, abdominal pain, diarrhea, constipation, blood in stool, abdominal distention and anal bleeding.  Genitourinary: Negative for dysuria, hematuria, flank pain and difficulty urinating.  Musculoskeletal: Negative for myalgias, back pain, joint swelling, arthralgias and gait problem.  Skin: Negative for color change, pallor, rash and wound.  Neurological: Negative for dizziness, tremors, weakness and light-headedness.  Hematological: Negative for adenopathy. Does not bruise/bleed easily.  Psychiatric/Behavioral: Negative for behavioral problems, confusion, sleep disturbance, dysphoric mood, decreased concentration and agitation.       Objective:   Physical Exam  Constitutional: He is oriented to person, place, and time. He appears well-developed and well-nourished. No distress.  HENT:  Head: Normocephalic and atraumatic.  Mouth/Throat: Oropharynx is clear and moist. No oropharyngeal exudate.  Eyes: Conjunctivae normal and EOM are normal. Pupils are equal, round, and reactive to light. No scleral icterus.  Neck: Normal range of motion. Neck supple. No JVD present.  Cardiovascular: Normal rate, regular rhythm and normal heart sounds.  Exam reveals no gallop and no friction rub.   No murmur heard. Pulmonary/Chest: Effort normal and breath sounds normal. No respiratory distress. He has no wheezes. He has no rales. He exhibits no tenderness.  Abdominal: He exhibits no distension and no mass. There is no tenderness. There is no  rebound and no guarding.  Musculoskeletal: He exhibits no  edema and no tenderness.       Back:  Lymphadenopathy:    He has no cervical adenopathy.  Neurological: He is alert and oriented to person, place, and time. He exhibits normal muscle tone. Coordination normal.  Skin: Skin is warm and dry. He is not diaphoretic. No erythema. No pallor.  Psychiatric: He has a normal mood and affect. His behavior is normal. Judgment and thought content normal.          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 Recheck MRI with contrast of thoracic spine next 2 weeks.  Paraspinal abscess: Again we'll obtain an MRI to re\re examine this area.  Methicillin-resistant staph aureus infection: Consider decolonization regimen in the future.  Adverse drug reaction to vancomycin: Resolved

## 2011-11-30 NOTE — Patient Instructions (Addendum)
Fu appt in December with Dr. Drue Second

## 2011-12-06 ENCOUNTER — Inpatient Hospital Stay: Payer: BC Managed Care – PPO | Admitting: Internal Medicine

## 2011-12-15 ENCOUNTER — Ambulatory Visit (INDEPENDENT_AMBULATORY_CARE_PROVIDER_SITE_OTHER): Payer: BC Managed Care – PPO | Admitting: Internal Medicine

## 2011-12-15 ENCOUNTER — Encounter: Payer: Self-pay | Admitting: Internal Medicine

## 2011-12-15 ENCOUNTER — Ambulatory Visit: Payer: BC Managed Care – PPO | Admitting: Internal Medicine

## 2011-12-15 VITALS — BP 125/75 | HR 75 | Temp 97.1°F | Wt 240.0 lb

## 2011-12-15 DIAGNOSIS — M869 Osteomyelitis, unspecified: Secondary | ICD-10-CM

## 2011-12-15 DIAGNOSIS — R110 Nausea: Secondary | ICD-10-CM

## 2011-12-15 DIAGNOSIS — M8618 Other acute osteomyelitis, other site: Secondary | ICD-10-CM

## 2011-12-15 DIAGNOSIS — M4620 Osteomyelitis of vertebra, site unspecified: Secondary | ICD-10-CM

## 2011-12-15 MED ORDER — PROMETHAZINE HCL 12.5 MG PO TABS: 12.5000 mg | ORAL_TABLET | Freq: Four times a day (QID) | ORAL | Status: DC | PRN

## 2011-12-15 NOTE — Progress Notes (Signed)
[facility] NOTE  RFV: follow up for MSSA T12 osteomyelitis , currently on daptomycin Subjective:    Patient ID: John Brandt, male    DOB: 1950/04/10, 61 y.o.   MRN: [id number]  HPI 61yo Male with PMHX of OSA, NASH, who had new onset back pain in late July found to have left side inflammation/mass of the T12 vertebra extending along the T11-T12 paraspinal soft tissues s/p elective open biopsy by Dr. Dutch Quint on 08/08/11 : Left T12 costotransversectomy with paraspinal biopsy. With positive cultures for MSSA. He was initially oncefazolin 2gm IV Q 8hr for approximately 5 wks. . He noticed having increasing nausea/vomiting and dry heaving and then a severe maculopapular rash. Cefazolin was changed to vancomycin completed 6 weeks of postoperative IV antibiotics. He completed his course of therapy and was observed off antibiotics. Unfortunately his infection recurred with evidence radiographically of worsening discitis with endplate destruction, T12 osteo and new paraspinal abscess. He underwent fluoro aspiration off of antibiotics with cx showing MSSA on 10/22. The patient was started on IV vancomycin in the hospital on October 30 and then changed to intravenous daptomycin on 11/5. He has been tolerating daptomycin without difficulty, no rash, no myalgias, no nausea, only subscribes to back pain. He finishes 6 wks of dapto on 12/27. He is to see Dr. Norma Fredrickson for pain management of his chronic, severe Back pain. He currently uses a cane, long acting tramadol. It is exacerbated by recent physical therapy session.   He denies fevers, chills or other systemic symptoms besides the back pain. He was last seen on 11/27 by Dr. Daiva Eves. The patient's weekly labs reveal ck 168, still WNL but sed rate of 51 which was the same as when he was hospitalized. He is scheduled for repeat MRI of thoracic spine next week.   Current Outpatient Prescriptions on File Prior to Visit  Medication Sig Dispense Refill  . acetaminophen  (TYLENOL) 500 MG tablet Take 1,000 mg by mouth every 6 (six) hours as needed. For pain      . amLODipine (NORVASC) 10 MG tablet Take 10 mg by mouth daily.       Marland Kitchen aspirin EC 81 MG tablet Take 81 mg by mouth daily.      Marland Kitchen atorvastatin (LIPITOR) 10 MG tablet Take 10 mg by mouth daily.       . cloNIDine (CATAPRES) 0.2 MG tablet Take 1 tablet (0.2 mg total) by mouth 3 (three) times daily. Hold unless BP > 140 when checked      . doxycycline (VIBRA-TABS) 100 MG tablet Take 1 tablet (100 mg total) by mouth 2 (two) times daily.  60 tablet  11  . feeding supplement (RESOURCE BREEZE) LIQD Take 1 Container by mouth as needed (For additional nutrition ).      . metoprolol succinate (TOPROL-XL) 100 MG 24 hr tablet Take 1 tablet (100 mg total) by mouth daily. Take with or immediately following a meal.  30 tablet  0  . Multiple Vitamin (MULTIVITAMIN WITH MINERALS) TABS Take 1 tablet by mouth daily.      . ondansetron (ZOFRAN ODT) 4 MG disintegrating tablet Take 1 tablet (4 mg total) by mouth every 8 (eight) hours as needed for nausea.  20 tablet  0  . ondansetron (ZOFRAN) 8 MG tablet Take 8 mg by mouth every 8 (eight) hours as needed. For nausea.      Marland Kitchen oxyCODONE-acetaminophen (PERCOCET/ROXICET) 5-325 MG per tablet Take 0.5-1 tablets by mouth every 8 (eight) hours as  needed.  20 tablet  0  . polyethylene glycol (MIRALAX / GLYCOLAX) packet Take 17 g by mouth daily as needed. For constipation.      . potassium chloride SA (K-DUR,KLOR-CON) 20 MEQ tablet Take 40 mEq by mouth 3 (three) times daily.      . sodium chloride 0.9 % SOLN 100 mL with DAPTOmycin 500 MG SOLR 900 mg Inject 900 mg into the vein daily. Through the IV PICC      . spironolactone (ALDACTONE) 25 MG tablet Take 25 mg by mouth daily.       Marland Kitchen triamcinolone (NASACORT) 55 MCG/ACT nasal inhaler Place 2 sprays into the nose daily. 2 sprays in each nostril daily      . vitamin C (ASCORBIC ACID) 500 MG tablet Take 500 mg by mouth daily.      . Vitamin D,  Ergocalciferol, (DRISDOL) 50000 UNITS CAPS Take 50,000 Units by mouth every 7 (seven) days. Taken on Saturdays.      Marland Kitchen buPROPion (WELLBUTRIN) 75 MG tablet Take 1 tablet (75 mg total) by mouth every morning. Until 11/09/2011. The please start Wellbutrin XL 150 mg daily on 11/10/2011.  6 tablet  0  . lidocaine (LIDODERM) 5 % Place 1 patch onto the skin daily. Remove & Discard patch within 12 hours or as directed by MD  30 patch  0  . metoCLOPramide (REGLAN) 5 MG tablet Take 1 tablet (5 mg total) by mouth 4 (four) times daily -  before meals and at bedtime. For 1 week then discontinue.  28 tablet  0  . pantoprazole (PROTONIX) 40 MG tablet Take 1 tablet (40 mg total) by mouth 2 (two) times daily.  60 tablet  0  . sucralfate (CARAFATE) 1 GM/10ML suspension Take 10 mLs (1 g total) by mouth 4 (four) times daily -  with meals and at bedtime.  420 mL  0  . vancomycin (VANCOCIN) 1 GM/200ML SOLN Inject 200 mLs (1,000 mg total) into the vein every 12 (twelve) hours. For total of 6 week.  4000 mL  0   Active Ambulatory Problems    Diagnosis Date Noted  . OSA (obstructive sleep apnea) 03/08/2011  . Insomnia 03/08/2011  . Nausea & vomiting 06/30/2011  . Leukocytosis 06/30/2011  . Hypokalemia 06/30/2011  . Hepatic steatosis 06/30/2011  . 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 MSSA (methicillin susceptible Staphylococcus aureus) septicemia 09/19/2011  . Drug rash 09/19/2011  . ARF (acute renal failure) 10/02/2011  . 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 Dehydration 10/02/2011  . Tachycardia 10/02/2011  . Chronic low back pain 10/02/2011  . Hypertension 10/02/2011  . Bradycardia 10/04/2011  . Low magnesium levels 10/06/2011  . Intractable nausea and vomiting 10/27/2011  . Metabolic acidosis 10/27/2011  . Duodenal ulcer 10/28/2011  . Gastritis 10/28/2011  . Reflux esophagitis 10/28/2011  . Cholecystitis chronic, acute 10/29/2011   Resolved Ambulatory Problems    Diagnosis Date  Noted  . No Resolved Ambulatory Problems   Past Medical History  Diagnosis Date  . Hyperlipidemia   . Allergic rhinitis   . Hx of cardiac cath   . Sleep apnea   . Chronic back pain   . Diskitis Aug 2013  . Complication of anesthesia   . PONV (postoperative nausea and vomiting)       Review of Systems 10 point ROS reviewed, positive and negative pertinents listed in HPI    Objective:   Physical Exam BP 125/75  Pulse 75  Temp 97.1 F (  36.2 C) (Oral)  Wt 240 lb (108.863 kg) Physical Exam  Constitutional: He is oriented to person, place, and time. He appears well-developed and well-nourished. No distress.  HENT:  Mouth/Throat: Oropharynx is clear and moist. No oropharyngeal exudate.  Cardiovascular: Normal rate, regular rhythm and normal heart sounds. Exam reveals no gallop and no friction rub.  No murmur heard.  Pulmonary/Chest: Effort normal and breath sounds normal. No respiratory distress. He has no wheezes.  Lymphadenopathy:  no cervical adenopathy.  Neurological: He is alert and oriented to person, place, and time.  Skin: Skin is warm and dry. No rash noted. No erythema.  Psychiatric: somewhat flat     Assessment & Plan:  Diskitis/T12 osteomyelitis = continue daptomycin at current dose until next appt on January 2nd. We are extending antibiotics for a few weeks since the patient's sed rate has not improved since initiation of antibiotics. Will pull picc and check inflammatory markers on jan 2nd. Will convert over to doxycycline  Nausea= will refill phenergan  Back pain = patient seeing Dr. Norma Fredrickson for his pain management.  rtc [place] 2nd

## 2011-12-19 ENCOUNTER — Ambulatory Visit (HOSPITAL_COMMUNITY)
Admission: RE | Admit: 2011-12-19 | Discharge: 2011-12-19 | Disposition: A | Discharge status: Home / Self Care | Payer: BC Managed Care – PPO | Source: Ambulatory Visit | Attending: Infectious Disease | Admitting: Infectious Disease

## 2011-12-19 DIAGNOSIS — M519 Unspecified thoracic, thoracolumbar and lumbosacral intervertebral disc disorder: Secondary | ICD-10-CM | POA: Insufficient documentation

## 2011-12-19 DIAGNOSIS — M869 Osteomyelitis, unspecified: Secondary | ICD-10-CM | POA: Insufficient documentation

## 2011-12-19 DIAGNOSIS — Z09 Encounter for follow-up examination after completed treatment for conditions other than malignant neoplasm: Secondary | ICD-10-CM | POA: Insufficient documentation

## 2011-12-19 DIAGNOSIS — A4101 Sepsis due to Methicillin susceptible Staphylococcus aureus: Secondary | ICD-10-CM | POA: Insufficient documentation

## 2011-12-19 MED ORDER — GADOBENATE DIMEGLUMINE 529 MG/ML IV SOLN
20.0000 mL | Freq: Once | INTRAVENOUS | Status: AC | PRN
  Administered 2011-12-19: 20 mL via INTRAVENOUS

## 2011-12-20 ENCOUNTER — Telehealth: Payer: Self-pay | Admitting: Infectious Disease

## 2011-12-20 NOTE — Telephone Encounter (Signed)
I am worried by his persistent MRI findings. Need review with Radiology and make sure Dr. Dutch Quint sees hjim I worry he needs further surgery

## 2011-12-27 ENCOUNTER — Encounter: Payer: Self-pay | Admitting: Infectious Disease

## 2012-01-02 ENCOUNTER — Telehealth: Payer: Self-pay | Admitting: *Deleted

## 2012-01-02 NOTE — Telephone Encounter (Signed)
Call from [facility] nurse for antibiotic orders, per office note patient is to continue antibiotics through 01/05/12.  Verbal order given per Dr. Feliz Beam notes Wendall Mola CMA

## 2012-01-04 ENCOUNTER — Other Ambulatory Visit: Payer: Self-pay | Admitting: Neurosurgery

## 2012-01-05 ENCOUNTER — Encounter: Payer: Self-pay | Admitting: Internal Medicine

## 2012-01-05 ENCOUNTER — Ambulatory Visit (INDEPENDENT_AMBULATORY_CARE_PROVIDER_SITE_OTHER): Payer: BC Managed Care – PPO | Admitting: Internal Medicine

## 2012-01-05 VITALS — BP 133/89 | HR 91 | Temp 97.7°F | Wt 241.0 lb

## 2012-01-05 DIAGNOSIS — M549 Dorsalgia, unspecified: Secondary | ICD-10-CM

## 2012-01-05 DIAGNOSIS — M869 Osteomyelitis, unspecified: Secondary | ICD-10-CM

## 2012-01-05 LAB — COMPREHENSIVE METABOLIC PANEL
Albumin: 4.1 g/dL (ref 3.5–5.2)
CO2: 23 mEq/L (ref 19–32)
Glucose, Bld: 119 mg/dL — ABNORMAL HIGH (ref 70–99)
Sodium: 138 mEq/L (ref 135–145)
Total Bilirubin: 0.5 mg/dL (ref 0.3–1.2)
Total Protein: 7.3 g/dL (ref 6.0–8.3)

## 2012-01-05 LAB — C-REACTIVE PROTEIN: CRP: 5.1 mg/dL — ABNORMAL HIGH (ref <0.60)

## 2012-01-05 LAB — SEDIMENTATION RATE: Sed Rate: 61 mm/hr — ABNORMAL HIGH (ref 0–16)

## 2012-01-05 MED ORDER — OXYCODONE-ACETAMINOPHEN 10-325 MG PO TABS: 1.0000 | ORAL_TABLET | Freq: Three times a day (TID) | ORAL | Status: DC | PRN

## 2012-01-05 NOTE — Progress Notes (Signed)
[facility] NOTE  RFV: hospital follow up for MSSA osteomyelitis/diskitis/abscess  Subjective:    Patient ID: John Brandt, male    DOB: September 08, 1950, 62 y.o.   MRN: [id number]  HPI 62yo Male with PMHX of OSA, NASH, found to have left side inflammation/mass of the T12 vertebra extending along the T11-T12 paraspinal soft tissues s/p elective open biopsy by Dr. Dutch Quint on 08/08/11 with Left T12 costotransversectomy with paraspinal biopsy. Cultures grew MSSA. He was on cefazolin 2gm IV Q 8hr for approximately 5 wks when he developed a severe maculopapular rash. Cefazolin was changed to vancomycin completed 6 weeks of postoperative IV antibiotics. He completed his course of therapy and was observed off antibiotics. Unfortunately his infection recurred with evidence radiographically of worsening discitis with endplate destruction, T12 osteo and new paraspinal abscess. He underwent fluoro aspiration off of antibiotics with cx showing MSSA on 10/22. The patient was started on IV vancomycin in the hospital on October 30 and then changed to intravenous daptomycin on 11/1. He has been tolerating daptomycin without difficulty, no rash, no myalgias, no nausea, only subscribes to back pain. In early December his sed rate was 51 which was the same as when he was hospitalized. 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 Paraspinous abscess is appear to have resolved. There remains extensive paraspinous inflammation and enhancement. He continued on Daptomycin to finish out 8 wks on 01/06/12.   He noticed through the month of December that he has had progressive back pain that occasionally radiates to right side. He is currently on a long acting tramadol which he takes in the morning, 50mg  tramadol dose in the evening, an  afternoon dose of percocet in the late afternoon, and most recently started valium 10mg  in the evening due to muscle spasms and pain. He is quite incapacitated and often is in such pain he is unable to mobilize in the morning. He tried a session of acupuncture without any improvement. He is to see Dr. Dutch Quint tomorrow.  He still has poor appetite, eats sufficient caloric intake but less than before. Subscribes to 60 # weight loss since the past summer  Current Outpatient Prescriptions on File Prior to Visit  Medication Sig Dispense Refill  . acetaminophen (TYLENOL) 500 MG tablet Take 1,000 mg by mouth every 6 (six) hours as needed. For pain      . amLODipine (NORVASC) 10 MG tablet Take 10 mg by mouth daily.       Marland Kitchen aspirin EC 81 MG tablet Take 81 mg by mouth daily.      Marland Kitchen atorvastatin (LIPITOR) 10 MG tablet Take 10 mg by mouth daily.       . cloNIDine (CATAPRES) 0.2 MG tablet Take 1 tablet (0.2 mg total) by mouth 3 (three) times daily. Hold unless BP > 140 when checked      . doxycycline (VIBRA-TABS) 100 MG tablet Take 1 tablet (100 mg total) by mouth 2 (two) times daily.  60 tablet  11  . feeding supplement (RESOURCE BREEZE) LIQD Take 1 Container by mouth as needed (For additional nutrition ).      . lidocaine (LIDODERM) 5 % Place 1 patch onto the skin daily. Remove & Discard patch within 12 hours or as directed by MD  30 patch  0  . metoCLOPramide (REGLAN) 5 MG tablet Take  1 tablet (5 mg total) by mouth 4 (four) times daily -  before meals and at bedtime. For 1 week then discontinue.  28 tablet  0  . metoprolol succinate (TOPROL-XL) 100 MG 24 hr tablet Take 1 tablet (100 mg total) by mouth daily. Take with or immediately following a meal.  30 tablet  0  . Multiple Vitamin (MULTIVITAMIN WITH MINERALS) TABS Take 1 tablet by mouth daily.      Marland Kitchen oxyCODONE-acetaminophen (PERCOCET/ROXICET) 5-325 MG per tablet Take 0.5-1 tablets by mouth every 8 (eight) hours as needed.  20 tablet  0  . pantoprazole  (PROTONIX) 40 MG tablet Take 1 tablet (40 mg total) by mouth 2 (two) times daily.  60 tablet  0  . polyethylene glycol (MIRALAX / GLYCOLAX) packet Take 17 g by mouth daily as needed. For constipation.      . potassium chloride SA (K-DUR,KLOR-CON) 20 MEQ tablet Take 40 mEq by mouth 3 (three) times daily.      . promethazine (PHENERGAN) 12.5 MG tablet Take 1 tablet (12.5 mg total) by mouth every 6 (six) hours as needed for nausea.  30 tablet  3  . sodium chloride 0.9 % SOLN 100 mL with DAPTOmycin 500 MG SOLR 900 mg Inject 900 mg into the vein daily. Through the IV PICC      . spironolactone (ALDACTONE) 25 MG tablet Take 25 mg by mouth daily.       . sucralfate (CARAFATE) 1 GM/10ML suspension Take 10 mLs (1 g total) by mouth 4 (four) times daily -  with meals and at bedtime.  420 mL  0  . traMADol (ULTRAM) 50 MG tablet Take 50 mg by mouth 2 (two) times daily as needed.      . traMADol (ULTRAM-ER) 300 MG 24 hr tablet Take 300 mg by mouth daily.      Marland Kitchen triamcinolone (NASACORT) 55 MCG/ACT nasal inhaler Place 2 sprays into the nose daily. 2 sprays in each nostril daily      . vancomycin (VANCOCIN) 1 GM/200ML SOLN Inject 200 mLs (1,000 mg total) into the vein every 12 (twelve) hours. For total of 6 week.  4000 mL  0  . vitamin C (ASCORBIC ACID) 500 MG tablet Take 500 mg by mouth daily.      . Vitamin D, Ergocalciferol, (DRISDOL) 50000 UNITS CAPS Take 50,000 Units by mouth every 7 (seven) days. Taken on Saturdays.       Active Ambulatory Problems    Diagnosis Date Noted  . OSA (obstructive sleep apnea) 03/08/2011  . Insomnia 03/08/2011  . Nausea & vomiting 06/30/2011  . Leukocytosis 06/30/2011  . Hypokalemia 06/30/2011  . Hepatic steatosis 06/30/2011  . 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 MSSA (methicillin susceptible Staphylococcus aureus) septicemia 09/19/2011  . Drug rash 09/19/2011  . ARF (acute renal failure) 10/02/2011  . 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  Dehydration 10/02/2011  . Tachycardia 10/02/2011  . Chronic low back pain 10/02/2011  . Hypertension 10/02/2011  . Bradycardia 10/04/2011  . Low magnesium levels 10/06/2011  . Intractable nausea and vomiting 10/27/2011  . Metabolic acidosis 10/27/2011  . Duodenal ulcer 10/28/2011  . Gastritis 10/28/2011  . Reflux esophagitis 10/28/2011  . Cholecystitis chronic, acute 10/29/2011   Resolved Ambulatory Problems    Diagnosis Date Noted  . No Resolved Ambulatory Problems   Past Medical History  Diagnosis Date  . Hyperlipidemia   . Allergic rhinitis   . Hx of cardiac cath   .  Sleep apnea   . Chronic back pain   . Diskitis Aug 2013  . Complication of anesthesia   . PONV (postoperative nausea and vomiting)     Review of Systems     Objective:   Physical Exam BP 133/89  Pulse 91  Temp 97.7 F (36.5 C) (Oral)  Wt 241 lb (109.317 kg) Physical Exam  Constitutional: He is oriented to person, place, and time. He appears well-developed and well-nourished. Fatigue appearing HENT:  Mouth/Throat: Oropharynx is clear and moist. No oropharyngeal exudate.  Cardiovascular: Normal rate, regular rhythm and normal heart sounds. Exam reveals no gallop and no friction rub.  No murmur heard.  Pulmonary/Chest: Effort normal and breath sounds normal. No respiratory distress. He has no wheezes.  Abdominal: Soft. Bowel sounds are normal. He exhibits no distension. There is no tenderness.  Lymphadenopathy:  no cervical adenopathy.  Back : right flank tenderness Neurological: He is alert and oriented to person, place, and time.  Skin: Skin is warm and dry. No rash noted. No erythema.  Psychiatric: depressed.      Assessment & Plan:   MSSA lumbar osteomyelitis = MRI findings suggests that fluid collection, ventral epidural abscess is unchanged despite 6 wk of IV dapto (at time of imaging), now has finished 8 wks of daptomycin. Will defer to dr. Dutch Quint to see if patient would need surgical  debridement.  Worsening back pain = could possibly be due to spinal stenosis from ventral epidural abscess vs endplate destruction of vertebrae. Again would defer to Dr. Ethelene Browns opinion to whether stabilization of affected area would improve pain management.  Patient would benefit from pain specialist, such as Thyra Breed. In meantime, will give rx for percocet 10/325, to use Q12hr PRN. In addn to his current regimen. Mentioned to watch for sedation and constipation. i have asked patient to see if Dr. Dutch Quint can arrange for referral.  Will check sed rate and crp.  Addendum: Lab Results  Component Value Date   ESRSEDRATE 61* 01/05/2012   Lab Results  Component Value Date   CRP 5.1* 01/05/2012   Sed rate increased from 50 to 61, may not be surprising given MRI findings. Plan is to switch to orals, bactrim DS 1 tab BID or doxycycline 100mg  BID.  Duke Salvia Drue Second MD MPH [facility] [phone]  MV:HQION

## 2012-01-06 ENCOUNTER — Telehealth: Payer: Self-pay | Admitting: Internal Medicine

## 2012-01-06 NOTE — Telephone Encounter (Signed)
I spoke to John Brandt and his wife regarding their visit with Dr. Dutch Quint. John Brandt has been fitted for a back brace to help T11-T12 fuse from fracture that has arisen, possibly due to sequelae of infection. We will called [facility] to pull picc line. Patient is to start doxycycline 100mg  BID and will return back to clinic in 4 wk. New pain regimen appears to be helping him. Mentioned to take with food since doxy can cause significant nausea

## 2012-01-16 ENCOUNTER — Encounter: Payer: Self-pay | Admitting: Infectious Disease

## 2012-01-19 ENCOUNTER — Other Ambulatory Visit: Payer: Self-pay | Admitting: Internal Medicine

## 2012-01-20 ENCOUNTER — Other Ambulatory Visit: Payer: Self-pay | Admitting: Internal Medicine

## 2012-01-20 DIAGNOSIS — M549 Dorsalgia, unspecified: Secondary | ICD-10-CM

## 2012-01-20 MED ORDER — OXYCODONE-ACETAMINOPHEN 10-325 MG PO TABS: 1.0000 | ORAL_TABLET | Freq: Three times a day (TID) | ORAL | Status: DC | PRN

## 2012-02-07 ENCOUNTER — Encounter: Payer: Self-pay | Admitting: Internal Medicine

## 2012-02-07 ENCOUNTER — Ambulatory Visit (INDEPENDENT_AMBULATORY_CARE_PROVIDER_SITE_OTHER): Payer: BC Managed Care – PPO | Admitting: Internal Medicine

## 2012-02-07 ENCOUNTER — Telehealth: Payer: Self-pay | Admitting: Licensed Clinical Social Worker

## 2012-02-07 VITALS — BP 145/87 | HR 62 | Temp 97.7°F

## 2012-02-07 DIAGNOSIS — A4901 Methicillin susceptible Staphylococcus aureus infection, unspecified site: Secondary | ICD-10-CM

## 2012-02-07 NOTE — Progress Notes (Signed)
[facility] NOTE  RFV: MSSA spinal infection Subjective:    Patient ID: John Brandt, male    DOB: 1950/08/09, 62 y.o.   MRN: [id number]  HPI  62yo Male with PMHX of OSA, NASH, found to have mass of the T12 vertebra extending along the T11-T12 paraspinal soft tissues s/p elective open biopsy by Dr. Dutch Quint on 08/08/11 with Left T12 costotransversectomy with paraspinal biopsy. Cultures grew MSSA. He was on cefazolin 2gm IV Q 8hr for approximately 5 wks when he developed a severe maculopapular rash. Cefazolin was changed to vancomycin completed 6 weeks of postoperative IV antibiotics. He completed his course of therapy and was observed off antibiotics. Unfortunately his infection recurred with evidence radiographically of worsening discitis with endplate destruction, T12 osteo and new paraspinal abscess. He underwent fluoro aspiration off of antibiotics with cx showing MSSA on 10/22. The patient was started on 2nd round of IV vancomycin on October 30 and then changed to  daptomycin on 11/1 which he took through 01/06/12.  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 Paraspinous abscess is appear to have resolved but evidence of fracture. HE has been in back brace which has helped fusion process in addition to pain meds.   Since last month, and medication change he feels improved pain management in the morning but no significant impact in the evening with the most recent pain management changes. He notices that he has spasms when he removes his back/chest brace. He did however, slipped out of bed 2 nights ago, and no injury but fell onto his knees.  He is to start back with physical therapy.  Tolerating doxycycline without any nausea, no rash.   He last saw Dr. Jordan Likes last week, who felt fractures are healing  better from xray. Think 2-3 months for the back brace, for fusion. Still not felt that he would be a candidate for pain management. to see Dr. Jordan Likes next time to see him on 03/01/2012. Needs handicap sticker renewal. Short term disability will kick in at end of feb/early march. He would like to  figure out how to get back to work, since he is the head of the dept for theater at [employer].  Being ill this long has been pscyhologically taxing. He subscribes to being depressed at times. His appetite is improving.  Review of Systems     Objective:   Physical Exam BP 145/87  Pulse 62  Temp 97.7 F (36.5 C) (Oral) Physical Exam  Constitutional: He is oriented to person, place, and time. He appears faitgue, wearing back brace. No distress.  HENT:  Mouth/Throat: Oropharynx is clear and moist. No oropharyngeal exudate.  Cardiovascular: Normal rate, regular rhythm and normal heart sounds. Exam reveals no gallop and no friction rub.  No murmur heard.  Pulmonary/Chest: Effort normal and breath sounds normal. No respiratory distress. He has no wheezes.  Abdominal: Soft. Bowel sounds are normal. He exhibits no distension. There is no tenderness.  Lymphadenopathy:  He has no cervical adenopathy.  Neurological: He is alert and oriented to person, place, and time.  Skin: Skin is warm and dry. No rash noted. No erythema.  Psychiatric: tearful at times     Lab Results  Component Value Date   ESRSEDRATE 61* 01/05/2012   Lab Results  Component Value Date   CRP 5.1* 01/05/2012  Assessment & Plan:  mssa osteomyelitis/spine infection =  Continue with doxycycline 100mg  BID for now. Will check blood work today with sed rate and crp.  Pain management =  Take 2 tabs of 10mg  percocet,  Then 1 tab percocet either in the afternoon or evening; tramadol ER 200 in am, plus an addn 50mg  tramadol at night. Also has valium 5mg  at bedtime. Will prescribed #120.PRN of percocet 10/325. No more 4 tabs per  day.  Physical therapy in the works.  rtc in 4 weeks  Cc: Dr. Dutch Quint

## 2012-02-07 NOTE — Telephone Encounter (Signed)
I called to see why PT stopped for this patient, and they did not have anymore orders after early January. According to [facility] if this patient needs more PT they just need a faxed order from the ordering physician faxed to [phone]

## 2012-02-21 ENCOUNTER — Other Ambulatory Visit: Payer: Self-pay | Admitting: Internal Medicine

## 2012-02-21 DIAGNOSIS — M549 Dorsalgia, unspecified: Secondary | ICD-10-CM

## 2012-02-21 MED ORDER — OXYCODONE-ACETAMINOPHEN 10-325 MG PO TABS: 1.0000 | ORAL_TABLET | Freq: Three times a day (TID) | ORAL | Status: DC | PRN

## 2012-03-07 ENCOUNTER — Encounter: Payer: Self-pay | Admitting: Internal Medicine

## 2012-03-07 ENCOUNTER — Ambulatory Visit (INDEPENDENT_AMBULATORY_CARE_PROVIDER_SITE_OTHER): Payer: BC Managed Care – PPO | Admitting: Internal Medicine

## 2012-03-07 VITALS — BP 146/82 | HR 62 | Temp 97.8°F | Wt 250.0 lb

## 2012-03-07 NOTE — Progress Notes (Signed)
[facility] NOTE  RFV: follow up visit for MSSA T12 osteo Subjective:    Patient ID: John Brandt, male    DOB: April 02, 1950, 62 y.o.   MRN: [id number]  HPI 62yo Male with PMHX of OSA, NASH, found to have mass of the T12 vertebra extending along the T11-T12 paraspinal soft tissues s/p elective open biopsy by Dr. Dutch Quint on 08/08/11 with Left T12 costotransversectomy with paraspinal biopsy. Cultures + MSSA. He was on cefazolin 2gm IV Q 8hr for approximately 5 wks when he developed a severe maculopapular rash. He was changed to vancomycin completed 6 weeks of postoperative IV antibiotics. He completed his course of therapy and was observed off antibiotics. Unfortunately his infection recurred with evidence radiographically of worsening discitis with endplate destruction, T12 osteo and new paraspinal abscess. He underwent fluoro aspiration off of antibiotics with cx showing MSSA on 10/22 s/p 2nd round of IV vancomycin on October 30 and then changed to daptomycin from 11/04/11- 01/06/12. 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 He has been in back brace which has helped fusion process in addition to pain meds. He has been on doxycycline for chronic suppression. Feb inflammatory markers showed sed rate of 27 and crp 1.7. He saw Dr. Dutch Quint on 03/01/2012, where he wants him to be in the back brace through mid-April to do a CT scan at that time to evaluate thoracic vertebral fusion. Had several trips at [facility] to see some plays which were tolerable. Doing better with stairs at home. Mood is improved  Allergies  Allergen Reactions  . Fish-Derived Products Nausea And Vomiting  . Cefazolin Rash    rash  . Iodine Rash  . Penicillins Rash   Current Outpatient Prescriptions on File Prior to Visit  Medication Sig Dispense Refill  . acetaminophen (TYLENOL) 500 MG tablet Take 1,000 mg by mouth every 6 (six) hours as  needed. For pain      . amLODipine (NORVASC) 10 MG tablet Take 10 mg by mouth daily.       Marland Kitchen aspirin EC 81 MG tablet Take 81 mg by mouth daily.      Marland Kitchen atorvastatin (LIPITOR) 10 MG tablet Take 10 mg by mouth daily.       . cloNIDine (CATAPRES) 0.2 MG tablet Take 1 tablet (0.2 mg total) by mouth 3 (three) times daily. Hold unless BP > 140 when checked      . diazepam (VALIUM) 10 MG tablet Take 10 mg by mouth at bedtime as needed.      . doxycycline (VIBRA-TABS) 100 MG tablet Take 1 tablet (100 mg total) by mouth 2 (two) times daily.  60 tablet  11  . feeding supplement (RESOURCE BREEZE) LIQD Take 1 Container by mouth as needed (For additional nutrition ).      . lidocaine (LIDODERM) 5 % Place 1 patch onto the skin daily. Remove & Discard patch within 12 hours or as directed by MD  30 patch  0  . metoCLOPramide (REGLAN) 5 MG tablet Take 1 tablet (5 mg total) by mouth 4 (four) times daily -  before meals and at bedtime. For 1 week then discontinue.  28 tablet  0  . metoprolol succinate (TOPROL-XL) 100 MG 24 hr tablet Take 1 tablet (100 mg total) by mouth daily. Take with or immediately following a meal.  30 tablet  0  . Multiple Vitamin (MULTIVITAMIN WITH MINERALS) TABS Take 1  tablet by mouth daily.      Marland Kitchen oxyCODONE-acetaminophen (PERCOCET) 10-325 MG per tablet Take 1 tablet by mouth every 8 (eight) hours as needed for pain.  90 tablet  0  . pantoprazole (PROTONIX) 40 MG tablet Take 1 tablet (40 mg total) by mouth 2 (two) times daily.  60 tablet  0  . polyethylene glycol (MIRALAX / GLYCOLAX) packet Take 17 g by mouth daily as needed. For constipation.      . potassium chloride SA (K-DUR,KLOR-CON) 20 MEQ tablet Take 40 mEq by mouth 3 (three) times daily.      . promethazine (PHENERGAN) 12.5 MG tablet Take 1 tablet (12.5 mg total) by mouth every 6 (six) hours as needed for nausea.  30 tablet  3  . sodium chloride 0.9 % SOLN 100 mL with DAPTOmycin 500 MG SOLR 900 mg Inject 900 mg into the vein daily.  Through the IV PICC      . spironolactone (ALDACTONE) 25 MG tablet Take 25 mg by mouth daily.       . sucralfate (CARAFATE) 1 GM/10ML suspension Take 10 mLs (1 g total) by mouth 4 (four) times daily -  with meals and at bedtime.  420 mL  0  . traMADol (ULTRAM) 50 MG tablet Take 50 mg by mouth 2 (two) times daily as needed.      . traMADol (ULTRAM-ER) 300 MG 24 hr tablet Take 300 mg by mouth daily.      Marland Kitchen triamcinolone (NASACORT) 55 MCG/ACT nasal inhaler Place 2 sprays into the nose daily. 2 sprays in each nostril daily      . vancomycin (VANCOCIN) 1 GM/200ML SOLN Inject 200 mLs (1,000 mg total) into the vein every 12 (twelve) hours. For total of 6 week.  4000 mL  0  . vitamin C (ASCORBIC ACID) 500 MG tablet Take 500 mg by mouth daily.      . Vitamin D, Ergocalciferol, (DRISDOL) 50000 UNITS CAPS Take 50,000 Units by mouth every 7 (seven) days. Taken on Saturdays.       No current facility-administered medications on file prior to visit.      Review of Systems Sleepy mid-morning likely due to pain meds. No diarrhea.     Objective:   Physical Exam BP 146/82  Pulse 62  Temp(Src) 97.8 F (36.6 C) (Oral)  Wt 250 lb (113.399 kg)  BMI 32.99 kg/m2 Physical Exam  Constitutional: He is oriented to person, place, and time. He appears well-developed and well-nourished. No distress, slightly fatigue appearing, wearing back brace HENT:  Mouth/Throat: Oropharynx is clear and moist. No oropharyngeal exudate.  Cardiovascular: Normal rate, regular rhythm and normal heart sounds. Exam reveals no gallop and no friction rub.  No murmur heard.  Pulmonary/Chest: Effort normal and breath sounds normal. No respiratory distress. He has no wheezes.  Lymphadenopathy:  no cervical adenopathy.  Neurological: He is alert and oriented to person, place, and time.  Skin: Skin is warm and dry. No rash noted. No erythema.  Psychiatric: He has a normal mood and affect. His behavior is normal.     Lab Results   Component Value Date   ESRSEDRATE 27* 02/07/2012   Lab Results  Component Value Date   CRP 1.7* 02/07/2012       Assessment & Plan:   MSSA thoracic vertebral osteomyelitis= will check sed rate and CRP at this visit to decide on length of therapy with doxycycline. Anticipate at least another 4 wks of doxycycline 100mg  BID  Pain management =  will renew an additional month of oxycodone 10/acetaminophen 325mg  #90, NR at his next refill. Advised him to try to decrease intake to 2- 2.5 pills/day. He does not take more than 3 a day. Does not exceed tylenol content of 1gm/day

## 2012-03-16 ENCOUNTER — Other Ambulatory Visit: Payer: Self-pay | Admitting: Neurosurgery

## 2012-03-22 ENCOUNTER — Other Ambulatory Visit: Payer: Self-pay | Admitting: *Deleted

## 2012-03-22 MED ORDER — OXYCODONE-ACETAMINOPHEN 10-325 MG PO TABS: 1.0000 | ORAL_TABLET | Freq: Three times a day (TID) | ORAL | Status: DC | PRN

## 2012-03-22 MED ORDER — OXYCODONE-ACETAMINOPHEN 5-325 MG PO TABS: 1.0000 | ORAL_TABLET | Freq: Four times a day (QID) | ORAL | Status: DC | PRN

## 2012-03-22 NOTE — Telephone Encounter (Signed)
Per Dr Drue Second changed the patient from Percocet 10-325 mg to 5-325 mg. Patient is aware of the change.

## 2012-03-23 ENCOUNTER — Ambulatory Visit
Admission: RE | Admit: 2012-03-23 | Discharge: 2012-03-23 | Disposition: A | Discharge status: Home / Self Care | Payer: BC Managed Care – PPO | Source: Ambulatory Visit | Attending: Neurosurgery | Admitting: Neurosurgery

## 2012-03-23 DIAGNOSIS — M4640 Discitis, unspecified, site unspecified: Secondary | ICD-10-CM

## 2012-04-12 ENCOUNTER — Ambulatory Visit: Payer: BC Managed Care – PPO | Admitting: Internal Medicine

## 2012-04-18 ENCOUNTER — Other Ambulatory Visit: Payer: Self-pay | Admitting: *Deleted

## 2012-04-18 DIAGNOSIS — M549 Dorsalgia, unspecified: Secondary | ICD-10-CM

## 2012-04-18 MED ORDER — OXYCODONE-ACETAMINOPHEN 5-325 MG PO TABS: 1.0000 | ORAL_TABLET | Freq: Three times a day (TID) | ORAL | Status: DC | PRN

## 2012-04-18 NOTE — Telephone Encounter (Signed)
Phone call to pt's home to notify that rx is ready for pick-up.  Notified that office will be closed on Friday, April 18th.  Family member verbalized understanding.

## 2012-05-01 ENCOUNTER — Encounter: Payer: Self-pay | Admitting: Internal Medicine

## 2012-05-01 ENCOUNTER — Ambulatory Visit (INDEPENDENT_AMBULATORY_CARE_PROVIDER_SITE_OTHER): Payer: BC Managed Care – PPO | Admitting: Internal Medicine

## 2012-05-01 VITALS — BP 154/82 | HR 56 | Temp 97.7°F | Wt 249.0 lb

## 2012-05-01 DIAGNOSIS — M869 Osteomyelitis, unspecified: Secondary | ICD-10-CM

## 2012-05-01 DIAGNOSIS — M4626 Osteomyelitis of vertebra, lumbar region: Secondary | ICD-10-CM

## 2012-05-01 LAB — COMPREHENSIVE METABOLIC PANEL
AST: 17 U/L (ref 0–37)
Alkaline Phosphatase: 89 U/L (ref 39–117)
BUN: 18 mg/dL (ref 6–23)
Creat: 0.97 mg/dL (ref 0.50–1.35)
Glucose, Bld: 91 mg/dL (ref 70–99)
Potassium: 4.3 mEq/L (ref 3.5–5.3)
Total Bilirubin: 0.5 mg/dL (ref 0.3–1.2)

## 2012-05-01 LAB — SEDIMENTATION RATE: Sed Rate: 11 mm/hr (ref 0–16)

## 2012-05-01 NOTE — Progress Notes (Signed)
[facility] NOTE  RFV: follow up from MSSA vertebral osteo  Subjective:    Patient ID: John Brandt, male    DOB: Oct 02, 1950, 62 y.o.   MRN: [id number]  HPI 62yo Male with PMHX of OSA, NASH, found to have mass of the T12 vertebra extending along the T11-T12 paraspinal soft tissues s/p elective open biopsy by Dr. Dutch Quint on 08/08/11 with Left T12 costotransversectomy with paraspinal biopsy. Cultures + MSSA. He was on cefazolin 2gm IV Q 8hr for approximately 5 wks when he developed a severe maculopapular rash. He was changed to vancomycin completed 6 weeks of postoperative IV antibiotics. He completed his course of therapy and was observed off antibiotics. Unfortunately his infection recurred with evidence radiographically of worsening discitis with endplate destruction, T12 osteo and new paraspinal abscess. He underwent fluoro aspiration off of antibiotics with cx showing MSSA on 10/22 s/p 2nd round of IV vancomycin on October 30 and then changed to daptomycin from 11/04/11- 01/06/12. 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 He has been in back brace which has helped fusion process in addition to pain meds. He has been on doxycycline for chronic suppression. Feb inflammatory markers showed sed rate of 13 and crp 1.0. He is back to work at [employer] going from 9:30 to 4:30 four days. Physical therapy for the past 4 wks. Doing better with stairs at home. Mood is improved. He has been able to be off of brace for up to 4 hrs per day. Did have new diagnosis by pcp of Osteopenia by bone scan, started calcium and vit D supp.  Current Outpatient Prescriptions on File Prior to Visit  Medication Sig Dispense Refill  . acetaminophen (TYLENOL) 500 MG tablet Take 1,000 mg by mouth every 6 (six) hours as needed. For pain      . amLODipine (NORVASC) 10 MG tablet Take 10 mg by mouth daily.       Marland Kitchen aspirin EC 81 MG  tablet Take 81 mg by mouth daily.      Marland Kitchen atorvastatin (LIPITOR) 10 MG tablet Take 10 mg by mouth daily.       . cloNIDine (CATAPRES) 0.2 MG tablet Take 1 tablet (0.2 mg total) by mouth 3 (three) times daily. Hold unless BP > 140 when checked      . diazepam (VALIUM) 10 MG tablet Take 10 mg by mouth at bedtime as needed.      . doxycycline (VIBRA-TABS) 100 MG tablet Take 1 tablet (100 mg total) by mouth 2 (two) times daily.  60 tablet  11  . feeding supplement (RESOURCE BREEZE) LIQD Take 1 Container by mouth as needed (For additional nutrition ).      . lidocaine (LIDODERM) 5 % Place 1 patch onto the skin daily. Remove & Discard patch within 12 hours or as directed by MD  30 patch  0  . metoCLOPramide (REGLAN) 5 MG tablet Take 1 tablet (5 mg total) by mouth 4 (four) times daily -  before meals and at bedtime. For 1 week then discontinue.  28 tablet  0  . metoprolol succinate (TOPROL-XL) 100 MG 24 hr tablet Take 1 tablet (100 mg total) by mouth daily. Take with or immediately following a meal.  30 tablet  0  . Multiple Vitamin (MULTIVITAMIN WITH MINERALS) TABS Take 1 tablet by mouth daily.      Marland Kitchen oxyCODONE-acetaminophen (PERCOCET/ROXICET) 5-325 MG per tablet Take 1 tablet  by mouth every 8 (eight) hours as needed for pain.  90 tablet  0  . pantoprazole (PROTONIX) 40 MG tablet Take 1 tablet (40 mg total) by mouth 2 (two) times daily.  60 tablet  0  . polyethylene glycol (MIRALAX / GLYCOLAX) packet Take 17 g by mouth daily as needed. For constipation.      . potassium chloride SA (K-DUR,KLOR-CON) 20 MEQ tablet Take 40 mEq by mouth 3 (three) times daily.      . promethazine (PHENERGAN) 12.5 MG tablet Take 1 tablet (12.5 mg total) by mouth every 6 (six) hours as needed for nausea.  30 tablet  3  . sodium chloride 0.9 % SOLN 100 mL with DAPTOmycin 500 MG SOLR 900 mg Inject 900 mg into the vein daily. Through the IV PICC      . spironolactone (ALDACTONE) 25 MG tablet Take 25 mg by mouth daily.       .  sucralfate (CARAFATE) 1 GM/10ML suspension Take 10 mLs (1 g total) by mouth 4 (four) times daily -  with meals and at bedtime.  420 mL  0  . traMADol (ULTRAM) 50 MG tablet Take 50 mg by mouth 2 (two) times daily as needed.      . traMADol (ULTRAM-ER) 300 MG 24 hr tablet Take 300 mg by mouth daily.      Marland Kitchen triamcinolone (NASACORT) 55 MCG/ACT nasal inhaler Place 2 sprays into the nose daily. 2 sprays in each nostril daily      . vancomycin (VANCOCIN) 1 GM/200ML SOLN Inject 200 mLs (1,000 mg total) into the vein every 12 (twelve) hours. For total of 6 week.  4000 mL  0  . vitamin C (ASCORBIC ACID) 500 MG tablet Take 500 mg by mouth daily.      . Vitamin D, Ergocalciferol, (DRISDOL) 50000 UNITS CAPS Take 50,000 Units by mouth every 7 (seven) days. Taken on Saturdays.       No current facility-administered medications on file prior to visit.   Active Ambulatory Problems    Diagnosis Date Noted  . OSA (obstructive sleep apnea) 03/08/2011  . Insomnia 03/08/2011  . Nausea & vomiting 06/30/2011  . Leukocytosis 06/30/2011  . Hypokalemia 06/30/2011  . Hepatic steatosis 06/30/2011  . 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 MSSA (methicillin susceptible Staphylococcus aureus) septicemia 09/19/2011  . Drug rash 09/19/2011  . ARF (acute renal failure) 10/02/2011  . 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 Dehydration 10/02/2011  . Tachycardia 10/02/2011  . Chronic low back pain 10/02/2011  . Hypertension 10/02/2011  . Bradycardia 10/04/2011  . Low magnesium levels 10/06/2011  . Intractable nausea and vomiting 10/27/2011  . Metabolic acidosis 10/27/2011  . Duodenal ulcer 10/28/2011  . Gastritis 10/28/2011  . Reflux esophagitis 10/28/2011  . Cholecystitis chronic, acute 10/29/2011   Resolved Ambulatory Problems    Diagnosis Date Noted  . No Resolved Ambulatory Problems   Past Medical History  Diagnosis Date  . Hyperlipidemia   . Allergic rhinitis   . Hx of cardiac cath   .  Sleep apnea   . Chronic back pain   . Diskitis Aug 2013  . Complication of anesthesia   . PONV (postoperative nausea and vomiting)    Social and family history is unchanged  Review of Systems 10 point ROS is negative other than back pain, fatigue from exertion    Objective:   Physical Exam BP 154/82  Pulse 56  Temp(Src) 97.7 F (36.5 C) (Oral)  Wt 249  lb (112.946 kg)  BMI 32.86 kg/m2 Physical Exam  Constitutional: He is oriented to person, place, and time. He appears well-developed and well-nourished. No distress.  HENT:  Mouth/Throat: Oropharynx is clear and moist. No oropharyngeal exudate.  Cardiovascular: Normal rate, regular rhythm and normal heart sounds. Exam reveals no gallop and no friction rub.  No murmur heard.  Pulmonary/Chest: Effort normal and breath sounds normal. No respiratory distress. He has no wheezes.  Abdominal: Soft. Bowel sounds are normal. He exhibits no distension. There is no tenderness.  Lymphadenopathy:  He has no cervical adenopathy.  Neurological: He is alert and oriented to person, place, and time.  Skin: Skin is warm and dry. No rash noted. No erythema.  Psychiatric: He has a normal mood and affect. His behavior is normal.    Lab Results  Component Value Date   ESRSEDRATE 13 03/07/2012   Lab Results  Component Value Date   CRP 1.0* 03/07/2012       Assessment & Plan:   Osteomyelitis = will continue with doxycycline for 4-6 wks and will check inflammatory labs to ensure still within normal range. Plan to stop doxycycline at next visit vs. Continuing for an indeterminant amount of time. The patient had previous recurrence after stopping antibiotics at 3-4 months time period  Pain management = currently on 5mg  percocet TID. We will check CMP to ensure no impact on LFTs. Ask patient to continue to keep weaning off if possible.  rtc 4-6 wks.  Cc: poole

## 2012-05-21 ENCOUNTER — Other Ambulatory Visit: Payer: Self-pay | Admitting: *Deleted

## 2012-05-21 DIAGNOSIS — M549 Dorsalgia, unspecified: Secondary | ICD-10-CM

## 2012-05-21 MED ORDER — OXYCODONE-ACETAMINOPHEN 5-325 MG PO TABS: 1.0000 | ORAL_TABLET | Freq: Two times a day (BID) | ORAL | Status: DC | PRN

## 2012-05-21 MED ORDER — OXYCODONE-ACETAMINOPHEN 5-325 MG PO TABS: 1.0000 | ORAL_TABLET | Freq: Three times a day (TID) | ORAL | Status: DC | PRN

## 2012-05-21 NOTE — Telephone Encounter (Signed)
Pt is weaning himself.

## 2012-06-07 ENCOUNTER — Encounter: Payer: Self-pay | Admitting: Internal Medicine

## 2012-06-07 ENCOUNTER — Ambulatory Visit (INDEPENDENT_AMBULATORY_CARE_PROVIDER_SITE_OTHER): Payer: BC Managed Care – PPO | Admitting: Internal Medicine

## 2012-06-07 VITALS — BP 153/74 | HR 62 | Temp 97.8°F | Wt 257.0 lb

## 2012-06-07 DIAGNOSIS — M4624 Osteomyelitis of vertebra, thoracic region: Secondary | ICD-10-CM

## 2012-06-07 DIAGNOSIS — M869 Osteomyelitis, unspecified: Secondary | ICD-10-CM

## 2012-06-07 LAB — COMPREHENSIVE METABOLIC PANEL
ALT: 14 U/L (ref 0–53)
Alkaline Phosphatase: 93 U/L (ref 39–117)
Creat: 0.9 mg/dL (ref 0.50–1.35)
Glucose, Bld: 82 mg/dL (ref 70–99)
Sodium: 140 mEq/L (ref 135–145)
Total Bilirubin: 0.5 mg/dL (ref 0.3–1.2)
Total Protein: 6.5 g/dL (ref 6.0–8.3)

## 2012-06-07 LAB — SEDIMENTATION RATE: Sed Rate: 9 mm/hr (ref 0–16)

## 2012-06-07 NOTE — Progress Notes (Signed)
[facility] NOTE  RFV: routine visit for T12 osteomyelitis Subjective:    Patient ID: John Brandt, male    DOB: 03-24-1950, 62 y.o.   MRN: [id number]  HPI John Brandt is a 62yo Male with MSSA T12 osteomyelitis noted in 2013 treated with 2 courses of IV therapy, then has been on chronic suppression with doxycycline. He is starting to decrease opiate medicaiton use for back pain and physical therapy.  For pain medication: he is taking tramadol with percocet as breakthrough.  Low back pain along belt line and side.attributes it to increased physical  Current Outpatient Prescriptions on File Prior to Visit  Medication Sig Dispense Refill  . amLODipine (NORVASC) 10 MG tablet Take 10 mg by mouth daily.       Marland Kitchen aspirin EC 81 MG tablet Take 81 mg by mouth daily.      Marland Kitchen atorvastatin (LIPITOR) 10 MG tablet Take 10 mg by mouth daily.       . cloNIDine (CATAPRES) 0.2 MG tablet Take 1 tablet (0.2 mg total) by mouth 3 (three) times daily. Hold unless BP > 140 when checked      . diazepam (VALIUM) 10 MG tablet Take 10 mg by mouth at bedtime as needed.      . doxycycline (VIBRA-TABS) 100 MG tablet Take 1 tablet (100 mg total) by mouth 2 (two) times daily.  60 tablet  11  . metoprolol succinate (TOPROL-XL) 100 MG 24 hr tablet Take 1 tablet (100 mg total) by mouth daily. Take with or immediately following a meal.  30 tablet  0  . Multiple Vitamin (MULTIVITAMIN WITH MINERALS) TABS Take 1 tablet by mouth daily.      Marland Kitchen oxyCODONE-acetaminophen (PERCOCET/ROXICET) 5-325 MG per tablet Take 1 tablet by mouth every 12 (twelve) hours as needed for pain.  60 tablet  0  . polyethylene glycol (MIRALAX / GLYCOLAX) packet Take 17 g by mouth daily as needed. For constipation.      . potassium chloride SA (K-DUR,KLOR-CON) 20 MEQ tablet Take 40 mEq by mouth 3 (three) times daily.      . promethazine (PHENERGAN) 12.5 MG tablet Take 1 tablet (12.5 mg total) by mouth every 6 (six) hours as needed for nausea.  30 tablet  3  .  spironolactone (ALDACTONE) 25 MG tablet Take 25 mg by mouth daily.       . traMADol (ULTRAM) 50 MG tablet Take 50 mg by mouth 2 (two) times daily as needed.      . traMADol (ULTRAM-ER) 300 MG 24 hr tablet Take 300 mg by mouth daily.      Marland Kitchen triamcinolone (NASACORT) 55 MCG/ACT nasal inhaler Place 2 sprays into the nose daily. 2 sprays in each nostril daily      . vitamin C (ASCORBIC ACID) 500 MG tablet Take 500 mg by mouth daily.      . Vitamin D, Ergocalciferol, (DRISDOL) 50000 UNITS CAPS Take 50,000 Units by mouth every 7 (seven) days. Taken on Saturdays.       No current facility-administered medications on file prior to visit.   Active Ambulatory Problems    Diagnosis Date Noted  . OSA (obstructive sleep apnea) 03/08/2011  . Insomnia 03/08/2011  . Nausea & vomiting 06/30/2011  . Leukocytosis 06/30/2011  . Hypokalemia 06/30/2011  . Hepatic steatosis 06/30/2011  . 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 MSSA (methicillin susceptible Staphylococcus aureus) septicemia 09/19/2011  . Drug rash 09/19/2011  . ARF (acute renal failure) 10/02/2011  .  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 Dehydration 10/02/2011  . Tachycardia 10/02/2011  . Chronic low back pain 10/02/2011  . Hypertension 10/02/2011  . Bradycardia 10/04/2011  . Low magnesium levels 10/06/2011  . Intractable nausea and vomiting 10/27/2011  . Metabolic acidosis 10/27/2011  . Duodenal ulcer 10/28/2011  . Gastritis 10/28/2011  . Reflux esophagitis 10/28/2011  . Cholecystitis chronic, acute 10/29/2011   Resolved Ambulatory Problems    Diagnosis Date Noted  . No Resolved Ambulatory Problems   Past Medical History  Diagnosis Date  . Hyperlipidemia   . Allergic rhinitis   . Hx of cardiac cath   . Sleep apnea   . Chronic back pain   . Diskitis Aug 2013  . Complication of anesthesia   . PONV (postoperative nausea and vomiting)        Review of Systems     Objective:   Physical Exam BP 153/74   Pulse 62  Temp(Src) 97.8 F (36.6 C) (Oral)  Wt 257 lb (116.574 kg)  BMI 33.91 kg/m2 Physical Exam  Constitutional: He is oriented to person, place, and time. He appears well-developed and well-nourished. No distress.  HENT:  Mouth/Throat: Oropharynx is clear and moist. No oropharyngeal exudate.  Cardiovascular: Normal rate, regular rhythm and normal heart sounds. Exam reveals no gallop and no friction rub.  No murmur heard.  Pulmonary/Chest: Effort normal and breath sounds normal. No respiratory distress. He has no wheezes.  Abdominal: Soft. Bowel sounds are normal. He exhibits no distension. There is no tenderness.  Lymphadenopathy:  He has no cervical adenopathy.  Neurological: He is alert and oriented to person, place, and time.  Skin: Skin is warm and dry. No rash noted. No erythema.  Psychiatric: He has a normal mood and affect. His behavior is normal.   Labs: Lab Results  Component Value Date   ESRSEDRATE 11 05/01/2012   Lab Results  Component Value Date   CRP 0.7* 05/01/2012        Assessment & Plan:   MSSA osteo with prolonged oral antibiotic suppression = will repeat sed rate and crp today. Plan on doing one more month of doxycycline, stopping for a month before next visit in 2 months since inflammatory markers are normal.  Check cmp due to tylenol use with pain medication.  rtc in 2months

## 2012-06-19 ENCOUNTER — Other Ambulatory Visit: Payer: Self-pay | Admitting: *Deleted

## 2012-06-19 DIAGNOSIS — M549 Dorsalgia, unspecified: Secondary | ICD-10-CM

## 2012-06-19 MED ORDER — OXYCODONE-ACETAMINOPHEN 5-325 MG PO TABS: 1.0000 | ORAL_TABLET | Freq: Two times a day (BID) | ORAL | Status: DC | PRN

## 2012-07-19 ENCOUNTER — Ambulatory Visit: Payer: BC Managed Care – PPO | Admitting: Internal Medicine

## 2012-07-30 ENCOUNTER — Other Ambulatory Visit: Payer: Self-pay | Admitting: Internal Medicine

## 2012-07-30 DIAGNOSIS — M549 Dorsalgia, unspecified: Secondary | ICD-10-CM

## 2012-07-30 MED ORDER — OXYCODONE-ACETAMINOPHEN 5-325 MG PO TABS: 1.0000 | ORAL_TABLET | Freq: Two times a day (BID) | ORAL | Status: DC | PRN

## 2012-08-09 ENCOUNTER — Encounter: Payer: Self-pay | Admitting: Internal Medicine

## 2012-08-09 ENCOUNTER — Ambulatory Visit (INDEPENDENT_AMBULATORY_CARE_PROVIDER_SITE_OTHER): Payer: BC Managed Care – PPO | Admitting: Internal Medicine

## 2012-08-09 VITALS — BP 131/76 | HR 57 | Temp 97.8°F | Wt 238.0 lb

## 2012-08-09 DIAGNOSIS — M9080 Osteopathy in diseases classified elsewhere, unspecified site: Secondary | ICD-10-CM

## 2012-08-09 DIAGNOSIS — E1169 Type 2 diabetes mellitus with other specified complication: Secondary | ICD-10-CM

## 2012-08-09 DIAGNOSIS — M869 Osteomyelitis, unspecified: Secondary | ICD-10-CM

## 2012-08-09 LAB — CBC WITH DIFFERENTIAL/PLATELET
Eosinophils Absolute: 0.3 10*3/uL (ref 0.0–0.7)
Eosinophils Relative: 4 % (ref 0–5)
HCT: 40.5 % (ref 39.0–52.0)
Lymphs Abs: 1.3 10*3/uL (ref 0.7–4.0)
MCH: 28.8 pg (ref 26.0–34.0)
MCV: 83.3 fL (ref 78.0–100.0)
Monocytes Absolute: 0.5 10*3/uL (ref 0.1–1.0)
Platelets: 272 10*3/uL (ref 150–400)
RBC: 4.86 MIL/uL (ref 4.22–5.81)

## 2012-08-27 ENCOUNTER — Other Ambulatory Visit: Payer: Self-pay | Admitting: Licensed Clinical Social Worker

## 2012-08-27 DIAGNOSIS — R110 Nausea: Secondary | ICD-10-CM

## 2012-08-27 MED ORDER — PROMETHAZINE HCL 12.5 MG PO TABS: 12.5000 mg | ORAL_TABLET | Freq: Four times a day (QID) | ORAL | Status: DC | PRN

## 2012-09-12 ENCOUNTER — Ambulatory Visit (INDEPENDENT_AMBULATORY_CARE_PROVIDER_SITE_OTHER): Payer: BC Managed Care – PPO | Admitting: Internal Medicine

## 2012-09-12 ENCOUNTER — Encounter: Payer: Self-pay | Admitting: Internal Medicine

## 2012-09-12 VITALS — BP 150/90 | HR 51 | Temp 97.9°F | Wt 229.0 lb

## 2012-09-12 DIAGNOSIS — M549 Dorsalgia, unspecified: Secondary | ICD-10-CM

## 2012-09-12 MED ORDER — OXYCODONE-ACETAMINOPHEN 5-325 MG PO TABS: 1.0000 | ORAL_TABLET | Freq: Two times a day (BID) | ORAL | Status: DC | PRN

## 2012-09-12 NOTE — Progress Notes (Signed)
[facility] NOTE  RFV: follow up for MSSA diskitis Subjective:    Patient ID: John Brandt, male    DOB: 04-Dec-1950, 62 y.o.   MRN: [id number]  HPI  John Brandt is a 62yo Male with MSSA T12 osteomyelitis noted in 2013 treated with 2 courses of IV therapy, then has been on chronic suppression with doxycycline which he stopped in July 9th and doing well thus far.  He had a 24hr period of dizziness/n/v, reports waking up with room spinning, vomited from nausea then symptoms abated on their own. No fever or chills.   He ran out of long acting tramadol but has taken short acting 50mg  TID without difficulty.his current pain medication regimen is Percocet 5/325 mg in the morning; tramadol 50mg  TID in the last 3 days. Doing well with physical therapy and returning to work.  Current Outpatient Prescriptions on File Prior to Visit  Medication Sig Dispense Refill  . amLODipine (NORVASC) 10 MG tablet Take 10 mg by mouth daily.       Marland Kitchen aspirin EC 81 MG tablet Take 81 mg by mouth daily.      Marland Kitchen atorvastatin (LIPITOR) 10 MG tablet Take 10 mg by mouth daily.       . cloNIDine (CATAPRES) 0.2 MG tablet Take 1 tablet (0.2 mg total) by mouth 3 (three) times daily. Hold unless BP > 140 when checked      . diazepam (VALIUM) 10 MG tablet Take 10 mg by mouth at bedtime as needed.      . doxycycline (VIBRA-TABS) 100 MG tablet Take 1 tablet (100 mg total) by mouth 2 (two) times daily.  60 tablet  11  . metoprolol succinate (TOPROL-XL) 100 MG 24 hr tablet Take 1 tablet (100 mg total) by mouth daily. Take with or immediately following a meal.  30 tablet  0  . Multiple Vitamin (MULTIVITAMIN WITH MINERALS) TABS Take 1 tablet by mouth daily.      Marland Kitchen oxyCODONE-acetaminophen (PERCOCET/ROXICET) 5-325 MG per tablet Take 1 tablet by mouth every 12 (twelve) hours as needed for pain.  45 tablet  0  . polyethylene glycol (MIRALAX / GLYCOLAX) packet Take 17 g by mouth daily as needed. For constipation.      . potassium chloride SA  (K-DUR,KLOR-CON) 20 MEQ tablet Take 40 mEq by mouth 3 (three) times daily.      . promethazine (PHENERGAN) 12.5 MG tablet Take 1 tablet (12.5 mg total) by mouth every 6 (six) hours as needed for nausea.  30 tablet  1  . spironolactone (ALDACTONE) 25 MG tablet Take 25 mg by mouth daily.       . traMADol (ULTRAM) 50 MG tablet Take 50 mg by mouth 2 (two) times daily as needed.      . traMADol (ULTRAM-ER) 300 MG 24 hr tablet Take 300 mg by mouth daily.      Marland Kitchen triamcinolone (NASACORT) 55 MCG/ACT nasal inhaler Place 2 sprays into the nose daily. 2 sprays in each nostril daily      . Vitamin D, Ergocalciferol, (DRISDOL) 50000 UNITS CAPS Take 50,000 Units by mouth every 7 (seven) days. Taken on Saturdays.       No current facility-administered medications on file prior to visit.   Active Ambulatory Problems    Diagnosis Date Noted  . OSA (obstructive sleep apnea) 03/08/2011  . Insomnia 03/08/2011  . Nausea & vomiting 06/30/2011  . Leukocytosis 06/30/2011  . Hypokalemia 06/30/2011  . Hepatic steatosis 06/30/2011  . 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 MSSA (methicillin susceptible Staphylococcus aureus) septicemia 09/19/2011  . Drug rash 09/19/2011  . ARF (acute renal failure) 10/02/2011  . 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 Dehydration 10/02/2011  . Tachycardia 10/02/2011  . Chronic low back pain 10/02/2011  . Hypertension 10/02/2011  . Bradycardia 10/04/2011  . Low magnesium levels 10/06/2011  . Intractable nausea and vomiting 10/27/2011  . Metabolic acidosis 10/27/2011  . Duodenal ulcer 10/28/2011  . Gastritis 10/28/2011  . Reflux esophagitis 10/28/2011  . Cholecystitis chronic, acute 10/29/2011   Resolved Ambulatory Problems    Diagnosis Date Noted  . No Resolved Ambulatory Problems   Past Medical History  Diagnosis Date  . Hyperlipidemia   . Allergic rhinitis   . Hx of cardiac cath   . Sleep apnea   . Chronic back pain   . Diskitis Aug 2013  .  Complication of anesthesia   . PONV (postoperative nausea and vomiting)      Lab Results  Component Value Date   ESRSEDRATE 11 08/09/2012   Lab Results  Component Value Date   CRP 0.6* 08/09/2012     Review of Systems Some back pain worse in the am. Doing well other than 1 24hr episode of dizziness/n/v.    Objective:   Physical Exam BP 150/90  Pulse 51  Temp(Src) 97.9 F (36.6 C) (Oral)  Wt 229 lb (103.874 kg)  BMI 30.22 kg/m2 Physical Exam  Constitutional: He is oriented to person, place, and time. He appears well-developed and well-nourished. No distress.  HENT:  Mouth/Throat: Oropharynx is clear and moist. No oropharyngeal exudate.  Cardiovascular: Normal rate, regular rhythm and normal heart sounds. Exam reveals no gallop and no friction rub.  No murmur heard.  Pulmonary/Chest: Effort normal and breath sounds normal. No respiratory distress. no wheezes.  Abdominal: Soft. Bowel sounds are normal. exhibits no distension. There is no tenderness.  Lymphadenopathy:  no cervical adenopathy.  Neurological:  alert and oriented to person, place, and time.  Skin: Skin is warm and dry. No rash noted. No erythema.  Psychiatric:a normal mood and affect.  behavior is normal.          Assessment & Plan:  Pain management = will give 5/325mg  Q12 PRN, anticipate that it will last 6- 8wk given his current pain medication.  Diskitis = doing well thus far. To see Dr. Dutch Quint. Will check inflammatory markers at next visit in 4 wks and continue with monitoring.   Vertigo = one episode BPPV. Will give rx for eply maneuver

## 2012-09-24 NOTE — Progress Notes (Signed)
[facility] NOTE  RFV: routine follow up for thoracic osteomyelitis Subjective:    Patient ID: John Brandt, male    DOB: 25-Jan-1950, 62 y.o.   MRN: [id number]  HPI  John Brandt is a 62yo Male with MSSA T12 osteomyelitis noted in 2013 treated with 2 courses of IV therapy, then has been on chronic suppression with doxycycline. He is starting to decrease opiate medicaiton use for back pain and physical therapy.  For pain medication: he is taking tramadol with percocet as breakthrough. Low back pain along belt line and side.attributes it to increased physical activity. 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 He has occasional bouts of nausea but unclear if due to pain exacerbation. No fever, chills, nightsweats  Current Outpatient Prescriptions on File Prior to Visit  Medication Sig Dispense Refill  . amLODipine (NORVASC) 10 MG tablet Take 10 mg by mouth daily.       Marland Kitchen aspirin EC 81 MG tablet Take 81 mg by mouth daily.      Marland Kitchen atorvastatin (LIPITOR) 10 MG tablet Take 10 mg by mouth daily.       . cloNIDine (CATAPRES) 0.2 MG tablet Take 1 tablet (0.2 mg total) by mouth 3 (three) times daily. Hold unless BP > 140 when checked      . diazepam (VALIUM) 10 MG tablet Take 10 mg by mouth at bedtime as needed.      . doxycycline (VIBRA-TABS) 100 MG tablet Take 1 tablet (100 mg total) by mouth 2 (two) times daily.  60 tablet  11  . metoprolol succinate (TOPROL-XL) 100 MG 24 hr tablet Take 1 tablet (100 mg total) by mouth daily. Take with or immediately following a meal.  30 tablet  0  . Multiple Vitamin (MULTIVITAMIN WITH MINERALS) TABS Take 1 tablet by mouth daily.      . polyethylene glycol (MIRALAX / GLYCOLAX) packet Take 17 g by mouth daily as needed. For constipation.      . potassium chloride SA (K-DUR,KLOR-CON) 20 MEQ tablet Take 40 mEq by mouth 3 (three) times daily.      Marland Kitchen spironolactone (ALDACTONE) 25 MG tablet Take 25 mg by mouth daily.       . traMADol (ULTRAM) 50  MG tablet Take 50 mg by mouth 2 (two) times daily as needed.      . traMADol (ULTRAM-ER) 300 MG 24 hr tablet Take 300 mg by mouth daily.      Marland Kitchen triamcinolone (NASACORT) 55 MCG/ACT nasal inhaler Place 2 sprays into the nose daily. 2 sprays in each nostril daily      . Vitamin D, Ergocalciferol, (DRISDOL) 50000 UNITS CAPS Take 50,000 Units by mouth every 7 (seven) days. Taken on Saturdays.       No current facility-administered medications on file prior to visit.   Active Ambulatory Problems    Diagnosis Date Noted  . OSA (obstructive sleep apnea) 03/08/2011  . Insomnia 03/08/2011  . Nausea & vomiting 06/30/2011  . Leukocytosis 06/30/2011  . Hypokalemia 06/30/2011  . Hepatic steatosis 06/30/2011  . 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 MSSA (methicillin susceptible Staphylococcus aureus) septicemia 09/19/2011  . Drug rash 09/19/2011  . ARF (acute renal failure) 10/02/2011  . 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 Dehydration 10/02/2011  . Tachycardia 10/02/2011  . Chronic low back pain 10/02/2011  . Hypertension 10/02/2011  . Bradycardia 10/04/2011  . Low magnesium levels 10/06/2011  . Intractable nausea and vomiting  10/27/2011  . Metabolic acidosis 10/27/2011  . Duodenal ulcer 10/28/2011  . Gastritis 10/28/2011  . Reflux esophagitis 10/28/2011  . Cholecystitis chronic, acute 10/29/2011   Resolved Ambulatory Problems    Diagnosis Date Noted  . No Resolved Ambulatory Problems   Past Medical History  Diagnosis Date  . Hyperlipidemia   . Allergic rhinitis   . Hx of cardiac cath   . Sleep apnea   . Chronic back pain   . Diskitis Aug 2013  . Complication of anesthesia   . PONV (postoperative nausea and vomiting)       Review of Systems 12 point ROS negative except for chronic back pain.    Objective:   Physical Exam BP 131/76  Pulse 57  Temp(Src) 97.8 F (36.6 C) (Oral)  Wt 238 lb (107.956 kg)  BMI 31.41 kg/m2 Physical Exam  Constitutional:  He is oriented to person, place, and time. He appears well-developed and well-nourished. No distress.  HENT:  Mouth/Throat: Oropharynx is clear and moist. No oropharyngeal exudate.  Cardiovascular: Normal rate, regular rhythm and normal heart sounds. Exam reveals no gallop and no friction rub.  No murmur heard.  Pulmonary/Chest: Effort normal and breath sounds normal. No respiratory distress. He has no wheezes.  Abdominal: Soft. Bowel sounds are normal. He exhibits no distension. There is no tenderness.  Lymphadenopathy:  He has no cervical adenopathy.  Neurological: He is alert and oriented to person, place, and time.  Skin: Skin is warm and dry. No rash noted. No erythema.  Psychiatric: He has a normal mood and affect. His behavior is normal.        Assessment & Plan:   chronic osteo = will check sed rate and crp. Still hold off on restarting doxycycline. Appears to be doing well   Chronic pain = continue to attempt to self wean

## 2012-10-10 ENCOUNTER — Ambulatory Visit: Payer: BC Managed Care – PPO | Admitting: Internal Medicine

## 2012-10-16 ENCOUNTER — Encounter: Payer: Self-pay | Admitting: Internal Medicine

## 2012-10-16 ENCOUNTER — Ambulatory Visit (INDEPENDENT_AMBULATORY_CARE_PROVIDER_SITE_OTHER): Payer: BC Managed Care – PPO | Admitting: Internal Medicine

## 2012-10-16 VITALS — BP 147/80 | HR 47 | Temp 97.7°F | Wt 252.0 lb

## 2012-10-16 DIAGNOSIS — A4101 Sepsis due to Methicillin susceptible Staphylococcus aureus: Secondary | ICD-10-CM

## 2012-10-16 DIAGNOSIS — M545 Low back pain: Secondary | ICD-10-CM

## 2012-10-16 DIAGNOSIS — G8929 Other chronic pain: Secondary | ICD-10-CM

## 2012-10-16 NOTE — Progress Notes (Signed)
[facility] NOTE  RFV: routine follow up for MSSA osteo Subjective:    Patient ID: John Brandt, male    DOB: 09/23/1950, 62 y.o.   MRN: [id number]  HPI John Brandt is a 62yo Male with MSSA T12 osteomyelitis noted in 2013 treated with 2 courses of IV therapy, then has been on chronic suppression with doxycycline which he stopped in July 9th and doing well thus far. his current pain medication regimen is Percocet 5/325 mg in the morning; tramadol 50mg  TID in the last 3 days. Doing well with physical therapy and returning to work. He continues to trend down his pain medication use.   Lab Results  Component Value Date   ESRSEDRATE 11 08/09/2012   Lab Results  Component Value Date   CRP 0.6* 08/09/2012     Current Outpatient Prescriptions on File Prior to Visit  Medication Sig Dispense Refill  . amLODipine (NORVASC) 10 MG tablet Take 10 mg by mouth daily.       Marland Kitchen aspirin EC 81 MG tablet Take 81 mg by mouth daily.      Marland Kitchen atorvastatin (LIPITOR) 10 MG tablet Take 10 mg by mouth daily.       . cloNIDine (CATAPRES) 0.2 MG tablet Take 1 tablet (0.2 mg total) by mouth 3 (three) times daily. Hold unless BP > 140 when checked      . diazepam (VALIUM) 10 MG tablet Take 10 mg by mouth at bedtime as needed.      . doxycycline (VIBRA-TABS) 100 MG tablet Take 1 tablet (100 mg total) by mouth 2 (two) times daily.  60 tablet  11  . metoprolol succinate (TOPROL-XL) 100 MG 24 hr tablet Take 1 tablet (100 mg total) by mouth daily. Take with or immediately following a meal.  30 tablet  0  . Multiple Vitamin (MULTIVITAMIN WITH MINERALS) TABS Take 1 tablet by mouth daily.      Marland Kitchen oxyCODONE-acetaminophen (PERCOCET/ROXICET) 5-325 MG per tablet Take 1 tablet by mouth every 12 (twelve) hours as needed for pain.  60 tablet  0  . polyethylene glycol (MIRALAX / GLYCOLAX) packet Take 17 g by mouth daily as needed. For constipation.      . potassium chloride SA (K-DUR,KLOR-CON) 20 MEQ tablet Take 40 mEq by mouth 3 (three)  times daily.      . promethazine (PHENERGAN) 12.5 MG tablet Take 1 tablet (12.5 mg total) by mouth every 6 (six) hours as needed for nausea.  30 tablet  1  . spironolactone (ALDACTONE) 25 MG tablet Take 25 mg by mouth daily.       . traMADol (ULTRAM) 50 MG tablet Take 50 mg by mouth 2 (two) times daily as needed.      . traMADol (ULTRAM-ER) 300 MG 24 hr tablet Take 300 mg by mouth daily.      Marland Kitchen triamcinolone (NASACORT) 55 MCG/ACT nasal inhaler Place 2 sprays into the nose daily. 2 sprays in each nostril daily      . Vitamin D, Ergocalciferol, (DRISDOL) 50000 UNITS CAPS Take 50,000 Units by mouth every 7 (seven) days. Taken on Saturdays.       No current facility-administered medications on file prior to visit.   Active Ambulatory Problems    Diagnosis Date Noted  . OSA (obstructive sleep apnea) 03/08/2011  . Insomnia 03/08/2011  . Nausea & vomiting 06/30/2011  . Leukocytosis 06/30/2011  . Hypokalemia 06/30/2011  . Hepatic steatosis 06/30/2011  . 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  MSSA (methicillin susceptible Staphylococcus aureus) septicemia 09/19/2011  . Drug rash 09/19/2011  . ARF (acute renal failure) 10/02/2011  . 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 Dehydration 10/02/2011  . Tachycardia 10/02/2011  . Chronic low back pain 10/02/2011  . Hypertension 10/02/2011  . Bradycardia 10/04/2011  . Low magnesium levels 10/06/2011  . Intractable nausea and vomiting 10/27/2011  . Metabolic acidosis 10/27/2011  . Duodenal ulcer 10/28/2011  . Gastritis 10/28/2011  . Reflux esophagitis 10/28/2011  . Cholecystitis chronic, acute 10/29/2011   Resolved Ambulatory Problems    Diagnosis Date Noted  . No Resolved Ambulatory Problems   Past Medical History  Diagnosis Date  . Hyperlipidemia   . Allergic rhinitis   . Hx of cardiac cath   . Sleep apnea   . Chronic back pain   . Diskitis Aug 2013  . Complication of anesthesia   . PONV (postoperative nausea and  vomiting)        Review of Systems Back pain per hpi. 12 pont ROS is otherwise negative    Objective:   Physical Exam BP 147/80  Pulse 47  Temp(Src) 97.7 F (36.5 C) (Oral)  Wt 252 lb (114.306 kg) gen = a xo by 4  Back = no tenderness along spine, using cane for support     Assessment & Plan:  MSSA T12 osteomyelitis who finished chronic suppressive antibiotics with doxycycline, with slow recuperation back to work. = will see him back PRN. Have his pcp repeat crp and sed rate in 6 months if still having back pain. Encourage to continue to wean off pain medications. Will only need to come back as needed.

## 2012-10-26 ENCOUNTER — Other Ambulatory Visit: Payer: Self-pay | Admitting: Internal Medicine

## 2012-11-08 ENCOUNTER — Other Ambulatory Visit: Payer: Self-pay

## 2012-11-15 ENCOUNTER — Other Ambulatory Visit: Payer: Self-pay | Admitting: Licensed Clinical Social Worker

## 2012-11-15 DIAGNOSIS — M549 Dorsalgia, unspecified: Secondary | ICD-10-CM

## 2012-11-15 MED ORDER — OXYCODONE-ACETAMINOPHEN 5-325 MG PO TABS: 1.0000 | ORAL_TABLET | Freq: Two times a day (BID) | ORAL | Status: DC | PRN

## 2012-11-23 ENCOUNTER — Other Ambulatory Visit: Payer: Self-pay | Admitting: Internal Medicine

## 2012-12-18 ENCOUNTER — Other Ambulatory Visit: Payer: Self-pay | Admitting: Internal Medicine

## 2013-06-17 ENCOUNTER — Other Ambulatory Visit (HOSPITAL_COMMUNITY): Payer: Self-pay | Admitting: Urology

## 2013-06-17 DIAGNOSIS — R972 Elevated prostate specific antigen [PSA]: Secondary | ICD-10-CM

## 2013-06-27 ENCOUNTER — Ambulatory Visit (HOSPITAL_COMMUNITY)
Admission: RE | Admit: 2013-06-27 | Discharge: 2013-06-27 | Disposition: A | Discharge status: Home / Self Care | Payer: BC Managed Care – PPO | Source: Ambulatory Visit | Attending: Urology | Admitting: Urology

## 2013-06-27 DIAGNOSIS — R972 Elevated prostate specific antigen [PSA]: Secondary | ICD-10-CM | POA: Insufficient documentation

## 2013-06-27 LAB — POCT I-STAT CREATININE: Creatinine, Ser: 1.1 mg/dL (ref 0.50–1.35)

## 2013-06-27 MED ORDER — GADOBENATE DIMEGLUMINE 529 MG/ML IV SOLN
20.0000 mL | Freq: Once | INTRAVENOUS | Status: AC | PRN
  Administered 2013-06-27: 20 mL via INTRAVENOUS

## 2013-08-18 ENCOUNTER — Encounter (HOSPITAL_COMMUNITY): Payer: Self-pay | Admitting: Emergency Medicine

## 2013-08-18 ENCOUNTER — Emergency Department (HOSPITAL_COMMUNITY)
Admission: EM | Admit: 2013-08-18 | Discharge: 2013-08-18 | Disposition: A | Discharge status: Home / Self Care | Payer: BC Managed Care – PPO | Attending: Emergency Medicine | Admitting: Emergency Medicine

## 2013-08-18 DIAGNOSIS — R110 Nausea: Secondary | ICD-10-CM | POA: Diagnosis not present

## 2013-08-18 DIAGNOSIS — G8929 Other chronic pain: Secondary | ICD-10-CM | POA: Insufficient documentation

## 2013-08-18 DIAGNOSIS — Z880 Allergy status to penicillin: Secondary | ICD-10-CM | POA: Diagnosis not present

## 2013-08-18 DIAGNOSIS — Z872 Personal history of diseases of the skin and subcutaneous tissue: Secondary | ICD-10-CM | POA: Insufficient documentation

## 2013-08-18 DIAGNOSIS — R059 Cough, unspecified: Secondary | ICD-10-CM | POA: Insufficient documentation

## 2013-08-18 DIAGNOSIS — R6883 Chills (without fever): Secondary | ICD-10-CM

## 2013-08-18 DIAGNOSIS — Z8619 Personal history of other infectious and parasitic diseases: Secondary | ICD-10-CM | POA: Insufficient documentation

## 2013-08-18 DIAGNOSIS — Z7982 Long term (current) use of aspirin: Secondary | ICD-10-CM | POA: Diagnosis not present

## 2013-08-18 DIAGNOSIS — Z79899 Other long term (current) drug therapy: Secondary | ICD-10-CM | POA: Diagnosis not present

## 2013-08-18 DIAGNOSIS — G4730 Sleep apnea, unspecified: Secondary | ICD-10-CM | POA: Diagnosis not present

## 2013-08-18 DIAGNOSIS — Z792 Long term (current) use of antibiotics: Secondary | ICD-10-CM | POA: Insufficient documentation

## 2013-08-18 DIAGNOSIS — Z8739 Personal history of other diseases of the musculoskeletal system and connective tissue: Secondary | ICD-10-CM | POA: Insufficient documentation

## 2013-08-18 DIAGNOSIS — Z9889 Other specified postprocedural states: Secondary | ICD-10-CM | POA: Insufficient documentation

## 2013-08-18 DIAGNOSIS — I10 Essential (primary) hypertension: Secondary | ICD-10-CM | POA: Diagnosis not present

## 2013-08-18 DIAGNOSIS — E785 Hyperlipidemia, unspecified: Secondary | ICD-10-CM | POA: Diagnosis not present

## 2013-08-18 DIAGNOSIS — R05 Cough: Secondary | ICD-10-CM | POA: Insufficient documentation

## 2013-08-18 NOTE — ED Provider Notes (Signed)
CSN: [id number]     Arrival date & time 08/18/13  8119 History   First MD Initiated Contact with Patient 08/18/13 [phone]     Chief Complaint  Patient presents with  . Cough  . Chills  . Nausea     (Consider location/radiation/quality/duration/timing/severity/associated sxs/prior Treatment) HPI Comments: John Brandt is a 62 y.o. Male with a PMHx of HTN, HLD, sleep apnea, chronic back pain, paraspinal abscess, and recent prostate bx on Friday who developed one episode of chills, nausea and retching overnight last night. States he woke up this morning feeling improved after eating breakfast. States during his bx he got valium and an unknown IM abx. Had been taking levaquin prior to bx. States today he feels completely better but was told by his urologist to be evaluated. Denies any ongoing fevers/chills, abd pain, n/v/d/c, CP, SOB, cough, myalgias or arthralgias. Denies ongoing hematuria after Friday. Denies changes in his urine.  Patient is a 63 y.o. male presenting with fever. The history is provided by the patient. No language interpreter was used.  Fever Max temp prior to arrival:  Unknown Temp source:  Subjective Severity:  Mild Onset quality:  Sudden Duration: transient over night. Timing:  Sporadic Progression:  Resolved Chronicity:  New Relieved by:  Nothing Worsened by:  Nothing tried Ineffective treatments:  None tried Associated symptoms: no chest pain, no chills, no confusion, no cough, no diarrhea, no dysuria, no headaches, no myalgias, no nausea, no rash and no vomiting   Risk factors: recent surgery     Past Medical History  Diagnosis Date  . Hypertension   . Hyperlipidemia   . Allergic rhinitis   . Hx of cardiac cath   . Sleep apnea     sleep study 07-02-02  . Chronic back pain   . 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 Complication of anesthesia   . PONV (postoperative nausea and  vomiting)    Past Surgical History  Procedure Laterality Date  . Vasectomy    . Dental implants    . Cardiac catheterization      more than 15 yrs ago  . Back surgery    . Open biopsy on back  08/08/2011  . Esophagogastroduodenoscopy  10/28/2011    Procedure: ESOPHAGOGASTRODUODENOSCOPY (EGD);  Surgeon: Lafayette Dragon, MD;  Location: [facility];  Service: Endoscopy;  Laterality: N/A;   Family History  Problem Relation Age of Onset  . Allergies Mother   . Heart disease Mother   . Lung cancer Mother     mesothelioma  . Aneurysm Brother     brain  . Heart disease Father   . Lung cancer Father    History  Substance Use Topics  . Smoking status: Never Smoker   . Smokeless tobacco: Never Used  . Alcohol Use: Yes     Comment: rare use    Review of Systems  Constitutional: Negative for fever and chills.  Respiratory: Negative for cough and shortness of breath.   Cardiovascular: Negative for chest pain.  Gastrointestinal: Negative for nausea, vomiting, abdominal pain, diarrhea, constipation and blood in stool.  Genitourinary: Negative for dysuria, hematuria and difficulty urinating.  Musculoskeletal: Negative for arthralgias and myalgias.  Skin: Negative for color change and rash.  Neurological: Negative for weakness, light-headedness and headaches.  Psychiatric/Behavioral: Negative for confusion.  10 Systems reviewed and are negative for acute change except  as noted in the HPI.     Allergies  Fish-derived products; Cefazolin; Iodine; and Penicillins  Home Medications   Prior to Admission medications   Medication Sig Start Date End Date Taking? Authorizing Provider  amLODipine (NORVASC) 10 MG tablet Take 10 mg by mouth daily.  01/22/11   Historical Provider, MD  aspirin EC 81 MG tablet Take 81 mg by mouth daily.    Historical Provider, MD  atorvastatin (LIPITOR) 10 MG tablet Take 10 mg by mouth daily.  12/19/10   Historical Provider, MD  cloNIDine (CATAPRES) 0.2 MG tablet  Take 1 tablet (0.2 mg total) by mouth 3 (three) times daily. Hold unless BP > 140 when checked 07/02/11   Geradine Girt, DO  diazepam (VALIUM) 10 MG tablet Take 10 mg by mouth at bedtime as needed.    Historical Provider, MD  doxycycline (VIBRA-TABS) 100 MG tablet Take 1 tablet (100 mg total) by mouth 2 (two) times daily. 11/30/11   Truman Hayward, MD  metoprolol succinate (TOPROL-XL) 100 MG 24 hr tablet Take 1 tablet (100 mg total) by mouth daily. Take with or immediately following a meal. 11/03/11   Bynum Bellows, MD  Multiple Vitamin (MULTIVITAMIN WITH MINERALS) TABS Take 1 tablet by mouth daily.    Historical Provider, MD  oxyCODONE-acetaminophen (PERCOCET/ROXICET) 5-325 MG per tablet Take 1 tablet by mouth every 12 (twelve) hours as needed. 11/15/12   Thayer Headings, MD  polyethylene glycol [facility] / Floria Raveling) packet Take 17 g by mouth daily as needed. For constipation.    Historical Provider, MD  potassium chloride SA (K-DUR,KLOR-CON) 20 MEQ tablet Take 40 mEq by mouth 3 (three) times daily.    Historical Provider, MD  promethazine (PHENERGAN) 12.5 MG tablet TAKE 1 TABLET EVERY 6 HOURS AS NEEDED FOR NAUSEA 11/23/12   Carlyle Basques, MD  spironolactone (ALDACTONE) 25 MG tablet Take 25 mg by mouth daily.  02/28/11   Historical Provider, MD  traMADol (ULTRAM) 50 MG tablet Take 50 mg by mouth 2 (two) times daily as needed. 10/27/11   Julianne Rice, MD  traMADol (ULTRAM-ER) 300 MG 24 hr tablet Take 300 mg by mouth daily.    Historical Provider, MD  triamcinolone (NASACORT) 55 MCG/ACT nasal inhaler Place 2 sprays into the nose daily. 2 sprays in each nostril daily 12/15/10   Historical Provider, MD  Vitamin D, Ergocalciferol, (DRISDOL) 50000 UNITS CAPS Take 50,000 Units by mouth every 7 (seven) days. Taken on Saturdays. 02/26/11   Historical Provider, MD   BP 125/77  Pulse 78  Temp(Src) 98.9 F (37.2 C) (Oral)  Resp 20  SpO2 99% Physical Exam  Nursing note and vitals  reviewed. Constitutional: He is oriented to person, place, and time. Vital signs are normal. He appears well-developed and well-nourished. No distress.  Afebrile, nontoxic  HENT:  Head: Normocephalic and atraumatic.  Mouth/Throat: Mucous membranes are normal.  Eyes: Conjunctivae and EOM are normal. Right eye exhibits no discharge. Left eye exhibits no discharge.  Neck: Normal range of motion. Neck supple.  Cardiovascular: Normal rate, regular rhythm, normal heart sounds and intact distal pulses.  Exam reveals no gallop and no friction rub.   No murmur heard. Pulmonary/Chest: Effort normal and breath sounds normal. No respiratory distress. He has no decreased breath sounds. He has no wheezes. He has no rhonchi. He has no rales.  Abdominal: Soft. Normal appearance and bowel sounds are normal. He exhibits no distension. There is no tenderness. There is no rigidity, no rebound,  no guarding and no CVA tenderness.  Musculoskeletal: Normal range of motion.  Neurological: He is alert and oriented to person, place, and time.  Skin: Skin is warm, dry and intact. No rash noted.  Psychiatric: He has a normal mood and affect.    ED Course  Procedures (including critical care time) Labs Review Labs Reviewed - No data to display  Imaging Review No results found.   EKG Interpretation None      MDM   Final diagnoses:  Chills (without fever)    62y/o male here s/p prostate bx who developed one episode of chills, nausea, and retching last night which went away this AM. Afebrile, nontoxic, well appearing. Doubt any sepsis or infectious source at this time. Could have been medication reaction or related to dehydration/decreased PO intake the day prior. Has had this reaction in the past to medications, when coming out of his system. Discussed monitoring symptoms and f/up in 3 days with PCP. I have given explicit precautions to return to the ER including for any other new or worsening symptoms. The  patient understands and accepts the medical plan as it's been dictated and I have answered their questions. Discharge instructions concerning home care and prescriptions have been given. The patient is STABLE and is discharged to home in good condition.  BP 125/77  Pulse 78  Temp(Src) 98.9 F (37.2 C) (Oral)  Resp 20  SpO2 99%   John Sermons [place]prubi-Soms, PA-C 08/18/13 1102  UPDATE: Urologist called around noon today, talked to Dr. Eulis Foster, and stated that they would like blood cultures, U/A and cultures, and more testing. Discussed at that time that the pt had left without these, given that he was afebrile nontoxic and did not seem to need any of this testing at that time. Attempted to call pt on his cell phone at 2:15PM, left message to have him call back. Will discuss with oncoming providers that if pt returns the call, to discuss with him the option of having more testing per urology or to f/up as outpatient.   John Sermons [place], [place] 08/18/13 1424

## 2013-08-18 NOTE — ED Notes (Signed)
Pt states that he started having coughing little during the day yesterday. Then woke up during the night sweaty, nauseated, with chills,  And little headache. Pt states he woke up again and felt fine but just little drained from all the symptoms. Pt states that on Friday had prostate biopsy and notified doctor Glo Herring (on call) this morning and was instructed to come in to ED for evaluation. Pt states that symptoms have subsided at this time.

## 2013-08-18 NOTE — ED Provider Notes (Signed)
  Face-to-face evaluation   History: The patient was awakened this morning by chills, nausea, and coughing. Symptoms lasted about one hour, then resolved. He had prostate biopsy, 2 days ago, that was uncomplicated. He was pretreated with oral antibiotics and received an injection of antibiotic, at the time of the biopsy. This morning, he was able to eat his normal breakfast. He is ambulatory without weakness or dizziness.  Physical exam: Alert, cooperative. Ambulates with his cane, as usual. He is nontoxic in appearance.  Medical screening examination/treatment/procedure(s) were conducted as a shared visit with non-physician practitioner(s) and myself.  I personally evaluated the patient during the encounter  Richarda Blade, MD 08/21/13 [phone]

## 2013-08-18 NOTE — ED Notes (Signed)
See triage note.  He states he took 3 days of oral antibiotic prior to prostate biopsy two days ago; and received an additional "antibiotic shot" just prior to the procedure.  He states he is comfortable and is in no distress.

## 2013-08-18 NOTE — Discharge Instructions (Signed)
Monitor her temperature at home, if you feel, like you're having a fever.  Drink plenty of fluids and eat a regular diet.  Return here if you develop a persistent fever, weakness, dizziness, nausea, or vomiting.

## 2013-10-18 ENCOUNTER — Other Ambulatory Visit: Payer: Self-pay

## 2014-04-23 ENCOUNTER — Ambulatory Visit (INDEPENDENT_AMBULATORY_CARE_PROVIDER_SITE_OTHER): Payer: BC Managed Care – PPO | Admitting: Pulmonary Disease

## 2014-04-23 ENCOUNTER — Encounter: Payer: Self-pay | Admitting: Pulmonary Disease

## 2014-04-23 VITALS — BP 116/72 | HR 65 | Temp 97.9°F | Ht 72.0 in | Wt 315.4 lb

## 2014-04-23 DIAGNOSIS — G4733 Obstructive sleep apnea (adult) (pediatric): Secondary | ICD-10-CM | POA: Diagnosis not present

## 2014-04-23 DIAGNOSIS — Z9989 Dependence on other enabling machines and devices: Principal | ICD-10-CM

## 2014-04-23 NOTE — Progress Notes (Signed)
Chief Complaint  Patient presents with  . SLEEP CONSULT    Former VS patient- 2013. Epworth Score: 6    History of Present Illness: John Brandt is a 64 y.o. male for evaluation of sleep problems.  I last saw him in 2013.  He got a new machine then.  He then got a difficult to control staph infection in his back, and had a very long recovery time.  He is doing better, but still has trouble with feeling fatigued during the day.  He still uses his CPAP.  He has a nasal mask.  He gets supplies from [facility].  He does not snore when using CPAP.  He goes to sleep at 10 pm.  He falls asleep quickly.  He wakes up 1 or 2 times to use the bathroom.  He gets out of bed at 4 to 5 am, but does not get out of bed until 7 am.  He feels okay in the morning.  He denies morning headache.  He does not use anything to help him fall sleep or stay awake.  He denies sleep walking, sleep talking, bruxism, or nightmares.  There is no history of restless legs.  He denies sleep hallucinations, sleep paralysis, or cataplexy.  The Epworth score is 6 out of 24.  Tests: Auto CPAP 03/24/14 to 04/22/14 >> used on 30 of 30 nights with average 8 hrs and 42 min.  Average AHI is 2.4 with CPAP 10 cm H2O.   RIPLEY BOGOSIAN  has a past medical history of Hypertension; Hyperlipidemia; Allergic rhinitis; cardiac cath; Sleep apnea; Chronic back pain; Paraspinal abscess (Aug 2013); Diskitis (Aug 2013); Complication of anesthesia; and PONV (postoperative nausea and vomiting).  JENNINGS CORADO  has past surgical history that includes Vasectomy; dental implants; Cardiac catheterization; Back surgery; Open Biopsy on Back (08/08/2011); and Esophagogastroduodenoscopy (10/28/2011).  Prior to Admission medications   Medication Sig Start Date End Date Taking? Authorizing Provider  amLODipine (NORVASC) 10 MG tablet Take 10 mg by mouth daily.  01/22/11  Yes Historical Provider, MD  aspirin EC 81 MG tablet Take 81 mg by mouth daily.   Yes  Historical Provider, MD  atorvastatin (LIPITOR) 10 MG tablet Take 10 mg by mouth daily.  12/19/10  Yes Historical Provider, MD  cholecalciferol (VITAMIN D) 1000 UNITS tablet Take 1,000 Units by mouth daily.   Yes Historical Provider, MD  cloNIDine (CATAPRES) 0.2 MG tablet Take 1 tablet (0.2 mg total) by mouth 3 (three) times daily. Hold unless BP > 140 when checked 07/02/11  Yes Jessica U Vann, DO  metoprolol succinate (TOPROL-XL) 100 MG 24 hr tablet Take 1 tablet (100 mg total) by mouth daily. Take with or immediately following a meal. 11/03/11  Yes Bynum Bellows, MD  Multiple Vitamin (MULTIVITAMIN WITH MINERALS) TABS Take 1 tablet by mouth daily.   Yes Historical Provider, MD  potassium chloride SA (K-DUR,KLOR-CON) 20 MEQ tablet Take 40 mEq by mouth 3 (three) times daily.   Yes Historical Provider, MD  triamcinolone (NASACORT) 55 MCG/ACT nasal inhaler Place 2 sprays into the nose daily. 2 sprays in each nostril daily 12/15/10  Yes Historical Provider, MD    Allergies  Allergen Reactions  . Fish-Derived Products Nausea And Vomiting  . Cefazolin Rash    rash  . Iodine Rash  . Penicillins Rash    His family history includes Allergies in his mother; Aneurysm in his brother; Heart disease in his father and mother; Lung cancer in his father  and mother.  He  reports that he has never smoked. He has never used smokeless tobacco. He reports that he drinks alcohol. He reports that he does not use illicit drugs.  Review of Systems  Constitutional: Negative for fever and unexpected weight change.  HENT: Positive for dental problem. Negative for congestion, ear pain, nosebleeds, postnasal drip, rhinorrhea, sinus pressure, sneezing, sore throat and trouble swallowing.   Eyes: Negative for redness and itching.  Respiratory: Negative for cough, chest tightness, shortness of breath and wheezing.   Cardiovascular: Positive for leg swelling ( feet swelling). Negative for palpitations.  Gastrointestinal:  Negative for nausea and vomiting.  Genitourinary: Negative for dysuria.  Musculoskeletal: Positive for arthralgias. Negative for joint swelling.  Skin: Negative for rash.  Neurological: Negative for headaches.  Hematological: Does not bruise/bleed easily.  Psychiatric/Behavioral: Negative for dysphoric mood. The patient is not nervous/anxious.    Physical Exam: Blood pressure 116/72, pulse 65, temperature 97.9 F (36.6 C), temperature source Oral, height 6' (1.829 m), weight 315 lb 6.4 oz (143.065 kg), SpO2 96 %. Body mass index is 42.77 kg/(m^2).  General - No distress ENT - No sinus tenderness, no oral exudate, no LAN, no thyromegaly, TM clear, pupils equal/reactive, MP 4, enlarged tongue Cardiac - s1s2 regular, no murmur, pulses symmetric Chest - No wheeze/rales/dullness, good air entry, normal respiratory excursion Back - No focal tenderness Abd - Soft, non-tender, no organomegaly, + bowel sounds Ext - 1+ edema Neuro - Normal strength, cranial nerves intact Skin - No rashes Psych - Normal mood, and behavior  Assessment/plan:  Obstructive sleep apnea. He is compliant with CPAP and reports benefit. Plan: - continue CPAP 10 cm H2O - will arrange for ONO with CPAP  Morbid obesity. Plan: - discussed importance of weight loss  Advanced sleep phase. Plan: - discussed techniques to help delay his sleep phase  Fatigue. I suspect this is related to his illness with recurrent staph infection. Plan: - f/u with PCP  Chesley Mires, M.D. Pager [phone]

## 2014-04-23 NOTE — Patient Instructions (Signed)
Will arrange for oxygen test at night while using CPAP  Follow up in 1 year

## 2014-04-23 NOTE — Progress Notes (Deleted)
   Subjective:    Patient ID: John Brandt, male    DOB: 08/15/1950, 64 y.o.   MRN: [id number]  HPI    Review of Systems  Constitutional: Negative for fever and unexpected weight change.  HENT: Positive for dental problem. Negative for congestion, ear pain, nosebleeds, postnasal drip, rhinorrhea, sinus pressure, sneezing, sore throat and trouble swallowing.   Eyes: Negative for redness and itching.  Respiratory: Negative for cough, chest tightness, shortness of breath and wheezing.   Cardiovascular: Positive for leg swelling ( feet swelling). Negative for palpitations.  Gastrointestinal: Negative for nausea and vomiting.  Genitourinary: Negative for dysuria.  Musculoskeletal: Positive for arthralgias. Negative for joint swelling.  Skin: Negative for rash.  Neurological: Negative for headaches.  Hematological: Does not bruise/bleed easily.  Psychiatric/Behavioral: Negative for dysphoric mood. The patient is not nervous/anxious.        Objective:   Physical Exam        Assessment & Plan:

## 2014-06-02 IMAGING — CR DG LUMBAR SPINE COMPLETE 4+V
6 series · 6 of 6 positions shown · non-contrast
Comparison: CT abdomen pelvis of 02/14/2006

CLINICAL DATA: Left-sided back pain for several months, no injury

LUMBAR SPINE - COMPLETE 4+ VIEW

[view not recorded (1 of 6)]
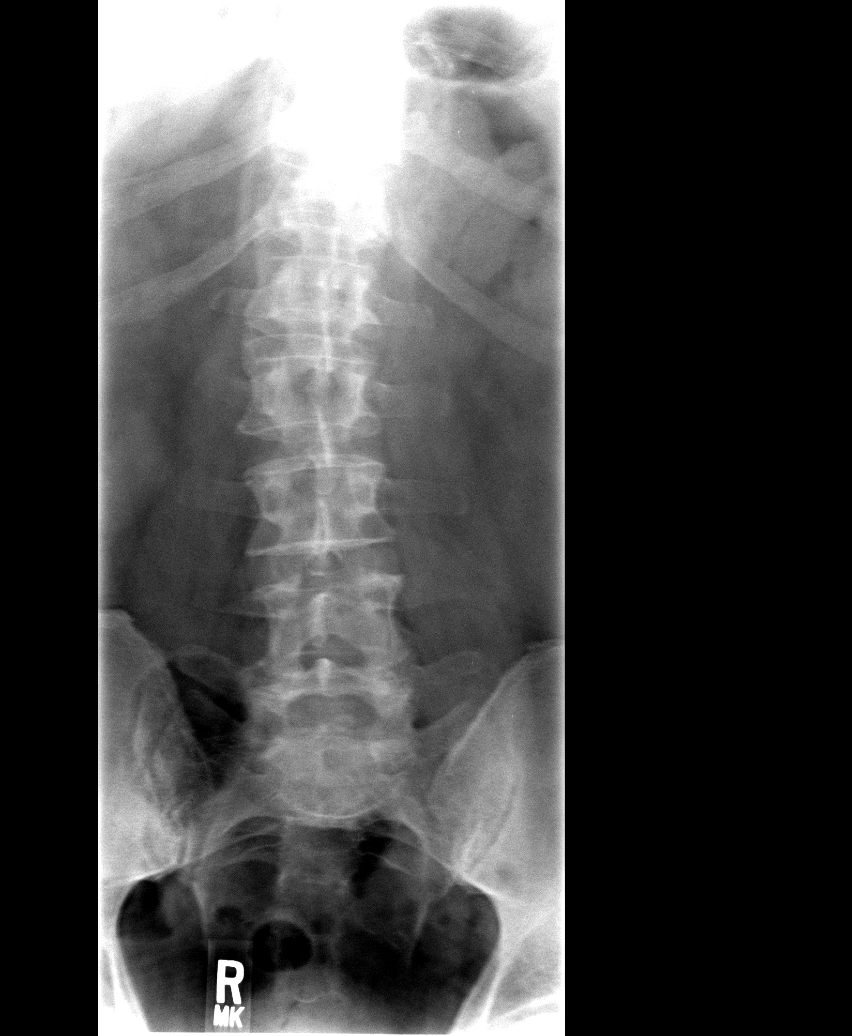

[view not recorded (2 of 6)]
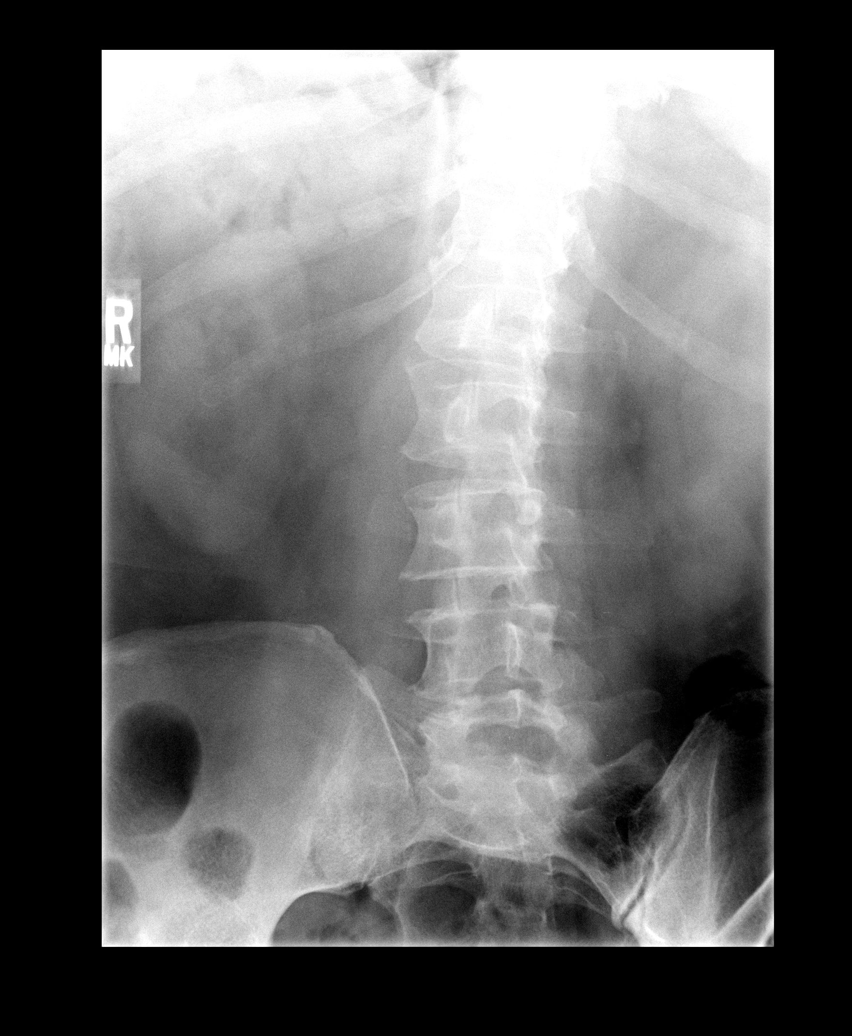

[view not recorded (3 of 6)]
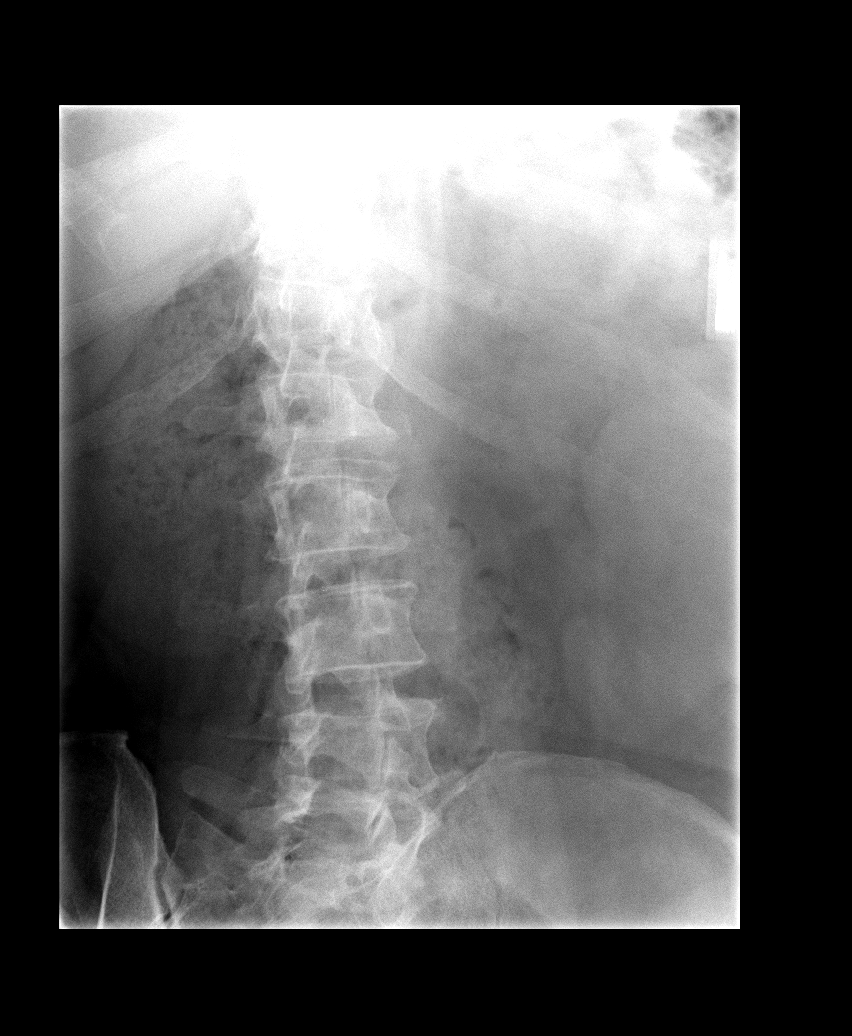

[view not recorded (4 of 6)]
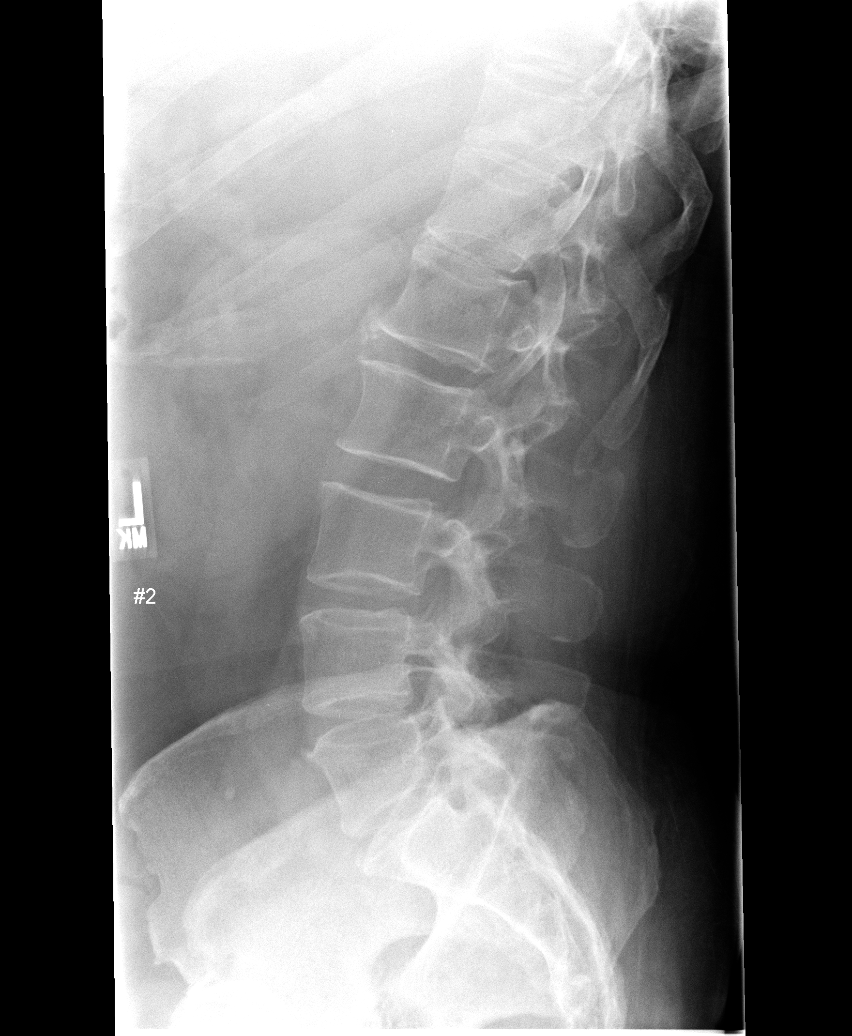

[view not recorded (5 of 6)]
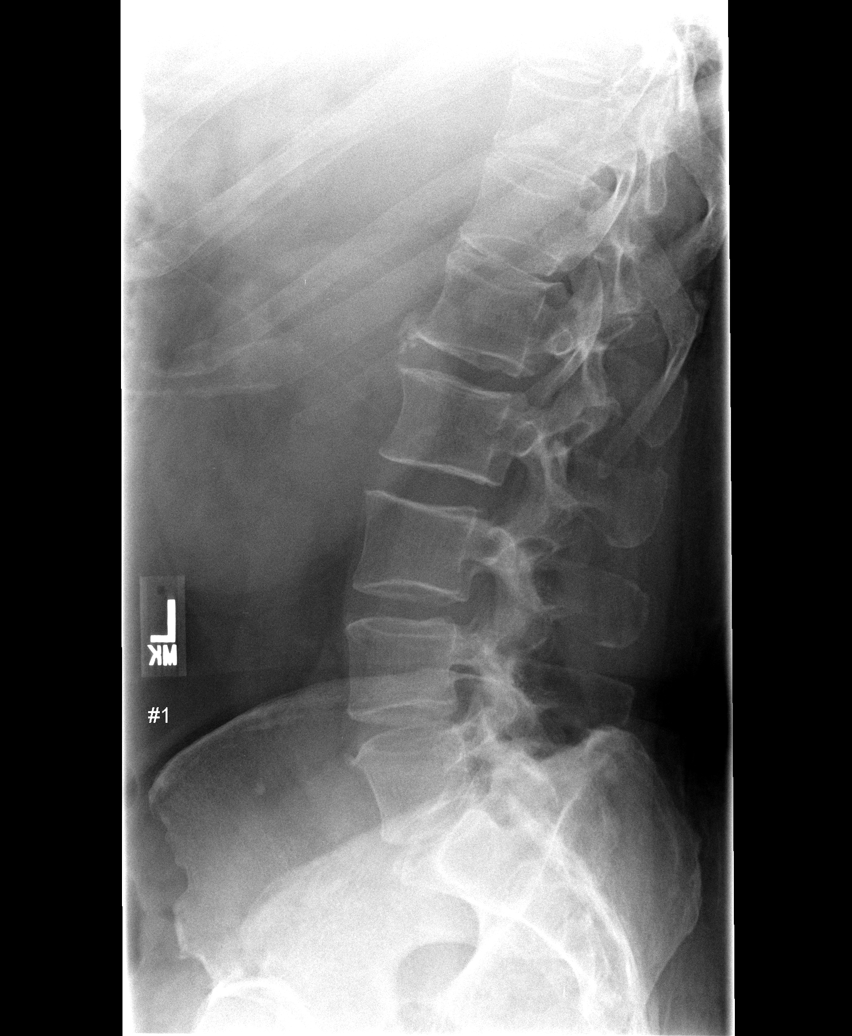

[view not recorded (6 of 6)]
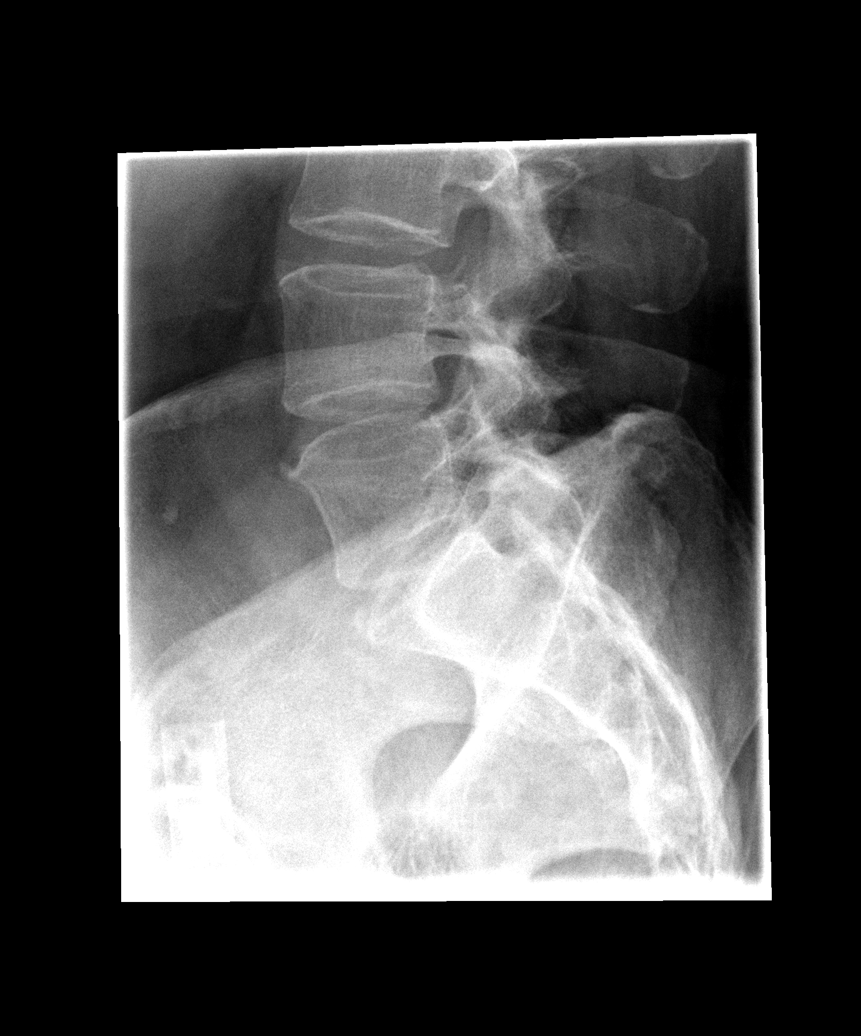

[6 of 6 positions shown; findings below may reference images not displayed]

FINDINGS: There is very mild curvature of the lumbar spine convex
to the right.  The lumbar vertebrae otherwise are in normal
alignment.  Intervertebral disc spaces appear normal.  No
compression deformity is seen.
IMPRESSION: Mild curvature convex to the right.  Normal intervertebral disc
spaces.  No acute abnormality.

## 2014-06-18 ENCOUNTER — Telehealth: Payer: Self-pay | Admitting: Pulmonary Disease

## 2014-06-18 DIAGNOSIS — E662 Morbid (severe) obesity with alveolar hypoventilation: Secondary | ICD-10-CM

## 2014-06-18 NOTE — Telephone Encounter (Signed)
lmtcb x2 for pt. 

## 2014-06-18 NOTE — Telephone Encounter (Signed)
ONO with CPAP 04/28/14 >> Test time 8 hrs 59 min.  Mean SpO2 89.8, low SpO2 81%.  Spent 2 hrs 52 min with SpO2 < 88%.   Attempted to contact pt to discuss results.  Will have my nurse inform pt that ONO shows his oxygen is low at night even though he is using CPAP.  This could be related to hypoventilation (shallow breathing) while asleep.  He needs to have in lab titration study starting with CPAP >> would then decide if he needs BiPAP and/or supplemental oxygen.  If pt is agreeable to plan, then please place order for in lab titration study.

## 2014-06-18 NOTE — Telephone Encounter (Signed)
LVM for pt to return call

## 2014-06-18 NOTE — Telephone Encounter (Signed)
[phone], pt cb

## 2014-06-20 NOTE — Telephone Encounter (Signed)
Results have been explained to patient, pt expressed understanding. Order placed. Nothing further needed.  

## 2014-06-30 ENCOUNTER — Other Ambulatory Visit: Payer: Self-pay

## 2014-07-10 ENCOUNTER — Encounter: Payer: Self-pay | Admitting: Pulmonary Disease

## 2014-09-05 ENCOUNTER — Encounter (HOSPITAL_BASED_OUTPATIENT_CLINIC_OR_DEPARTMENT_OTHER): Payer: BC Managed Care – PPO

## 2014-11-12 ENCOUNTER — Encounter (HOSPITAL_BASED_OUTPATIENT_CLINIC_OR_DEPARTMENT_OTHER): Payer: BC Managed Care – PPO

## 2016-05-04 ENCOUNTER — Ambulatory Visit (INDEPENDENT_AMBULATORY_CARE_PROVIDER_SITE_OTHER): Payer: BC Managed Care – PPO | Admitting: Pulmonary Disease

## 2016-05-04 ENCOUNTER — Encounter: Payer: Self-pay | Admitting: Pulmonary Disease

## 2016-05-04 VITALS — BP 148/88 | HR 61 | Ht 71.0 in | Wt 310.6 lb

## 2016-05-04 DIAGNOSIS — G4733 Obstructive sleep apnea (adult) (pediatric): Secondary | ICD-10-CM | POA: Diagnosis not present

## 2016-05-04 NOTE — Patient Instructions (Signed)
Will get copy of CPAP download  Will arrange for overnight oxygen test with you using CPAP  Follow up in 6 months

## 2016-05-04 NOTE — Progress Notes (Signed)
Current Outpatient Prescriptions on File Prior to Visit  Medication Sig  . amLODipine (NORVASC) 10 MG tablet Take 10 mg by mouth daily.   Marland Kitchen aspirin EC 81 MG tablet Take 81 mg by mouth daily.  Marland Kitchen atorvastatin (LIPITOR) 10 MG tablet Take 10 mg by mouth daily.   . cholecalciferol (VITAMIN D) 1000 UNITS tablet Take 1,000 Units by mouth daily.  . cloNIDine (CATAPRES) 0.2 MG tablet Take 1 tablet (0.2 mg total) by mouth 3 (three) times daily. Hold unless BP > 140 when checked  . metoprolol succinate (TOPROL-XL) 100 MG 24 hr tablet Take 1 tablet (100 mg total) by mouth daily. Take with or immediately following a meal.  . Multiple Vitamin (MULTIVITAMIN WITH MINERALS) TABS Take 1 tablet by mouth daily.  . potassium chloride SA (K-DUR,KLOR-CON) 20 MEQ tablet Take 60 mEq by mouth daily. Take 2 tabs in AM and 1 tab in afternoon.  . triamcinolone (NASACORT) 55 MCG/ACT nasal inhaler Place 2 sprays into the nose daily. 2 sprays in each nostril daily   No current facility-administered medications on file prior to visit.      Chief Complaint  Patient presents with  . Follow-up    Wears CPAP nightly. Denies problems with mask or pressure. Current pressure 12.  DME: [facility]     Tests ONO with CPAP 04/28/14 >> Test time 8 hrs 59 min.  Mean SpO2 89.8, low SpO2 81%.  Spent 2 hrs 52 min with SpO2 < 88%  Past medical history  Past surgical history, Family history, Social history, Allergies all reviewed.  Vital Signs BP (!) 148/88 (BP Location: Left Arm, Cuff Size: Large)   Pulse 61   Ht 5\' 11"  (1.803 m)   Wt (!) 310 lb 9.6 oz (140.9 kg)   SpO2 95%   BMI 43.32 kg/m   History of Present Illness John Brandt is a 66 y.o. male with obstructive sleep apnea.  I last saw him 2 years ago.  At that time he had ONO with CPAP.  This showed low oxygen.  He had family difficulties, and wasn't able to follow up with testing.  He uses CPAP nightly.  He doesn't have issue with mask fit.  He gets about 5.5 hours  sleep per night, and feels this is enough.  He has noticed waking up more during the night.  He also gets more tired during the day after activities.    He denies sinus congestion, sore throat, chest pain.  He fell about a year ago and hurt his Lt leg.  He gets intermittent leg swelling.  This is being assessed by his PCP.   Physical Exam  General - No distress ENT - No sinus tenderness, no oral exudate, no LAN, MP 3 Cardiac - s1s2 regular, no murmur Chest - No wheeze/rales/dullness Back - No focal tenderness Abd - Soft, non-tender Ext - No edema Neuro - Normal strength Skin - No rashes Psych - normal mood, and behavior   CMP Latest Ref Rng & Units 06/27/2013 06/07/2012 05/01/2012  Glucose 70 - 99 mg/dL - 82 91  BUN 6 - 23 mg/dL - 18 18  Creatinine 0.50 - 1.35 mg/dL 1.10 0.90 0.97  Sodium 135 - 145 mEq/L - 140 140  Potassium 3.5 - 5.3 mEq/L - 4.1 4.3  Chloride 96 - 112 mEq/L - 104 106  CO2 19 - 32 mEq/L - 26 26  Calcium 8.4 - 10.5 mg/dL - 9.7 9.8  Total Protein 6.0 - 8.3 g/dL -  6.5 6.5  Total Bilirubin 0.3 - 1.2 mg/dL - 0.5 0.5  Alkaline Phos 39 - 117 U/L - 93 89  AST 0 - 37 U/L - 18 17  ALT 0 - 53 U/L - 14 15    CBC Latest Ref Rng & Units 08/09/2012 11/03/2011 11/01/2011  WBC 4.0 - 10.5 K/uL 7.1 8.4 9.5  Hemoglobin 13.0 - 17.0 g/dL 14.0 10.0(L) 12.0(L)  Hematocrit 39.0 - 52.0 % 40.5 30.5(L) 36.9(L)  Platelets 150 - 400 K/uL 272 210 290     Assessment/Plan  Obstructive sleep apnea. - he reports compliance with CPAP - he believes his current setting is 12 cm H2O - will need to repeat ONO with CPAP and get CPAP download - if his oxygen is still low at night, then he will need in lab titration study >> he would like to avoid this option if possible  Obesity. - discussed importance of weight loss  Time spent 26 minutes   Patient Instructions  Will get copy of CPAP download  Will arrange for overnight oxygen test with you using CPAP  Follow up in 6  months    Chesley Mires, MD [facility] Pager:  [phone] 05/04/2016, 11:10 AM

## 2016-05-09 ENCOUNTER — Encounter: Payer: Self-pay | Admitting: Pulmonary Disease

## 2016-05-10 ENCOUNTER — Telehealth: Payer: Self-pay | Admitting: Pulmonary Disease

## 2016-05-10 DIAGNOSIS — G4733 Obstructive sleep apnea (adult) (pediatric): Secondary | ICD-10-CM

## 2016-05-10 NOTE — Telephone Encounter (Signed)
CPAP 04/09/16 to 05/08/16 >> used on 30 of 30 nights with average 8 hrs 3 min.  Average AHI 1.9 with CPAP 10 cm H2O   Will have my nurse inform pt that CPAP report shows good control of CPAP with setting of 10 cm H2O.  Will call back once overnight oxygen test results are available.

## 2016-05-10 NOTE — Telephone Encounter (Signed)
ONO with CPAP 05/10/16 >> test time 6 hrs 55 min.  Average SpO2 91%, low SpO2 82%.  Spent 40 min with SpO2 < 88%.   Will have my nurse inform pt that his oxygen level is low at night even though he is using CPAP.  He needs to have in lab CPAP titration study to determine if he needs adjustment to CPAP and whether he needs to use supplemental oxygen at night with CPAP.    If he is agreeable to this, then please arrange for CPAP titration study starting with CPAP 10 cm H2O and add supplemental oxygen or transition to Bipap as needed.

## 2016-05-13 NOTE — Telephone Encounter (Signed)
Results have been explained to patient, pt expressed understanding. Orders placed.  Nothing further needed.

## 2016-06-28 ENCOUNTER — Ambulatory Visit (HOSPITAL_BASED_OUTPATIENT_CLINIC_OR_DEPARTMENT_OTHER): Payer: BC Managed Care – PPO | Attending: Pulmonary Disease | Admitting: Pulmonary Disease

## 2016-06-28 DIAGNOSIS — Z79899 Other long term (current) drug therapy: Secondary | ICD-10-CM | POA: Insufficient documentation

## 2016-06-28 DIAGNOSIS — G4733 Obstructive sleep apnea (adult) (pediatric): Secondary | ICD-10-CM | POA: Insufficient documentation

## 2016-07-12 ENCOUNTER — Telehealth: Payer: Self-pay | Admitting: Pulmonary Disease

## 2016-07-12 DIAGNOSIS — G4733 Obstructive sleep apnea (adult) (pediatric): Secondary | ICD-10-CM

## 2016-07-12 NOTE — Telephone Encounter (Signed)
CPAP titration 06/28/16 >> sub-optimal study due to insufficient sleep time.   Will have my nurse inform pt that he had trouble getting enough sleep during CPAP titration study, and therefore difficult to determine optimal setting for CPAP.  Please have his CPAP increased from 10 to 12 cm H2O and arrange for repeat ONO with CPAP on new setting.  Will call him with results to determine if further adjustments are needed.

## 2016-07-12 NOTE — Procedures (Signed)
   Patient Name: John Brandt, John Brandt Date: 06/28/2016 Gender: Male D.O.B: [birth year] Age (years): 71 Referring Provider: Chesley Mires MD, ABSM Height (inches): 72 Interpreting Physician: Chesley Mires MD, ABSM Weight (lbs): 295 RPSGT: Zadie Rhine BMI: 40 MRN: [id number] Neck Size: 18.50  CLINICAL INFORMATION  He has history of obstructive sleep apnea on CPAP 10 cm H2O.  Recent overnight oximetry on CPAP showed low oxygen level.  He is referred for a CPAP titration study.  Prior sleep study: AHI 109, SpO2 low 73% from 07/02/02.  SLEEP STUDY TECHNIQUE As per the AASM Manual for the Scoring of Sleep and Associated Events v2.3 (April 2016) with a hypopnea requiring 4% desaturations.  The channels recorded and monitored were frontal, central and occipital EEG, electrooculogram (EOG), submentalis EMG (chin), nasal and oral airflow, thoracic and abdominal wall motion, anterior tibialis EMG, snore microphone, electrocardiogram, and pulse oximetry. Continuous positive airway pressure (CPAP) was initiated at the beginning of the study and titrated to treat sleep-disordered breathing.  MEDICATIONS Medications self-administered by patient taken the night of the study : CLONIDINE, SPIRONOLACTONE, CALCIUM CITRATE D  TECHNICIAN COMMENTS Comments added by technician: PT WENT TO RESTROOM 6 TIME. Patient had difficulty initiating sleep. Patient was restless all through the night. Patient had more than two awakenings to use the bathroom  Comments added by scorer: N/A  RESPIRATORY PARAMETERS He was started on CPAP 5 and increased to CPAP 10 cm H2O.  He had difficulty initiating and maintaining sleep.  He did not have sufficient sleep time with CPAP to determine optimal pressure setting.  SLEEP ARCHITECTURE The study was initiated at 10:16:56 PM and ended at 5:01:13 AM.  Sleep onset time was 26.5 minutes and the sleep efficiency was 21.0%. The total sleep time was 85.0 minutes.  The patient  spent 24.12% of the night in stage N1 sleep, 71.76% in stage N2 sleep, 0.00% in stage N3 and 4.12% in REM.Stage REM latency was 367.0 minutes  Wake after sleep onset was 292.8. Alpha intrusion was absent. Supine sleep was 0.00%.  CARDIAC DATA The 2 lead EKG demonstrated sinus rhythm. The mean heart rate was 45.49 beats per minute. Other EKG findings include: None.  LEG MOVEMENT DATA The total Periodic Limb Movements of Sleep (PLMS) were 0. The PLMS index was 0.00. A PLMS index of <15 is considered normal in adults.  IMPRESSIONS - This was a sub-optimal titration study due to insufficient sleep during the study.  DIAGNOSIS - Obstructive Sleep Apnea (327.23 [G47.33 ICD-10])  RECOMMENDATIONS - I would recommend that he have his CPAP increased to 12 cm H2O.  He should then undergo a repeat overnight oximetry.  If his oxygen level is still low, then he might need a repeat in lab titration starting with CPAP 12 cm H2O.  He would likely also need to use a sleep aide to ensure adequate sleep duration if he has a repeat titration study.  [Electronically signed] 07/12/2016 04:40 PM  Chesley Mires MD, [place], American Board of Sleep Medicine   NPI: [id number]

## 2016-07-18 NOTE — Telephone Encounter (Signed)
Results have been explained to patient, pt expressed understanding.  Order placed for pressure change - increase to 12cm H2O.  Order placed for ONO on CPAP 12cm.  Nothing further needed.

## 2016-07-25 ENCOUNTER — Encounter: Payer: Self-pay | Admitting: Pulmonary Disease

## 2016-08-01 ENCOUNTER — Telehealth: Payer: Self-pay | Admitting: Pulmonary Disease

## 2016-08-01 DIAGNOSIS — G4733 Obstructive sleep apnea (adult) (pediatric): Secondary | ICD-10-CM

## 2016-08-01 NOTE — Telephone Encounter (Signed)
ONO with CPAP 07/26/16 >> test time 8 hrs 55 min.  Average SpO2 91%, low SpO2 86%.  Spent 49 min with SpO2 < 88%.   Will have my nurse inform pt that ONO with CPAP looked better since increase in CPAP to 12 cm H2O.  However, he is still having episodes of low oxygen.  Please send order to increase his CPAP to 13 cm H2O.  He doesn't need repeat ONO after this pressure change since his settings are close to where they need to be.

## 2016-08-12 NOTE — Telephone Encounter (Signed)
Spoke with patient's wife Hinton Dyer. She is aware of results. Confirmed that patient is still using [facility] as his DME. Will place the order today.

## 2018-01-08 ENCOUNTER — Encounter: Payer: Self-pay | Admitting: Pulmonary Disease

## 2018-01-08 ENCOUNTER — Ambulatory Visit: Payer: BC Managed Care – PPO | Admitting: Pulmonary Disease

## 2018-01-08 VITALS — BP 132/86 | HR 71 | Ht 72.0 in | Wt 320.8 lb

## 2018-01-08 DIAGNOSIS — E669 Obesity, unspecified: Secondary | ICD-10-CM

## 2018-01-08 DIAGNOSIS — G4730 Sleep apnea, unspecified: Secondary | ICD-10-CM | POA: Diagnosis not present

## 2018-01-08 DIAGNOSIS — G4733 Obstructive sleep apnea (adult) (pediatric): Secondary | ICD-10-CM

## 2018-01-08 DIAGNOSIS — Z9989 Dependence on other enabling machines and devices: Secondary | ICD-10-CM | POA: Diagnosis not present

## 2018-01-08 NOTE — Patient Instructions (Signed)
Will arrange for new CPAP mask and supplies, and call with results of CPAP download  Follow up in 1 year

## 2018-01-08 NOTE — Progress Notes (Signed)
[facility]  Chief Complaint  Patient presents with  . Follow-up    pt wearing cpap avg 5-6hr nightly-feels pressure & mask are okay. DME: [facility]    Constitutional:  BP 132/86 (BP Location: Left Arm, Cuff Size: Normal)   Pulse 71   Ht 6' (1.829 m)   Wt (!) 320 lb 12.8 oz (145.5 kg)   SpO2 97%   BMI 43.51 kg/m   Past Medical History:  HTN, HLD, Diskitis, Chronic back pain, Allergies  Brief Summary:  John Brandt is a 68 y.o. male with obstructive sleep apnea.  Doing well with CPAP.  No issues with mask fit.  About to retire later this Spring.  Sometimes fall asleep in afternoon reading the paper.  Otherwise feels good.  Not having sinus congestion, sore throat, dry mouth, or aerophagia.   Physical Exam:   Appearance - well kempt   ENMT - clear nasal mucosa, midline nasal  septum, no oral exudates, no LAN, trachea midline  Respiratory - normal chest wall, normal respiratory effort, no accessory muscle use, no wheeze/rales  CV - s1s2 regular rate and rhythm, no murmurs, no peripheral edema, radial pulses symmetric  GI - soft, non tender, no masses  Lymph - no adenopathy noted in neck and axillary areas  MSK - normal gait  Ext - no cyanosis, clubbing, or joint inflammation noted  Skin - no rashes, lesions, or ulcers  Neuro - normal strength, oriented x 3  Psych - normal mood and affect  Assessment/Plan:    Obstructive sleep apnea. - he is compliant with CPAP and reports benefit - continue CPAP 13 cm H2O  Obesity. - discussed importance of weight loss   Patient Instructions  Will arrange for new CPAP mask and supplies, and call with results of CPAP download  Follow up in 1 year    Chesley Mires, MD [place] Pager: [phone] 01/08/2018, 9:41 AM  Flow Sheet    Sleep tests:  ONO with CPAP 05/10/16 >> test time 6 hrs 55 min.  Average SpO2 91%, low SpO2 82%.  Spent 40 min with SpO2 <  88%. ONO with CPAP 07/26/16 >> test time 8 hrs 55 min.  Average SpO2 91%, low SpO2 86%.  Spent 49 min with SpO2 < 88%.  Medications:   Allergies as of 01/08/2018      Reactions   Fish-derived Products Nausea And Vomiting   Cefazolin Rash   rash   Iodine Rash   Penicillins Rash      Medication List       Accurate as of January 08, 2018  9:41 AM. Always use your most recent med list.        amLODipine 10 MG tablet Commonly known as:  NORVASC Take 10 mg by mouth daily.   aspirin EC 81 MG tablet Take 81 mg by mouth daily.   atorvastatin 10 MG tablet Commonly known as:  LIPITOR Take 10 mg by mouth daily.   cholecalciferol 1000 units tablet Commonly known as:  VITAMIN D Take 1,000 Units by mouth daily.   cloNIDine 0.2 MG tablet Commonly known as:  CATAPRES Take 1 tablet (0.2 mg total) by mouth 3 (three) times daily. Hold unless BP > 140 when checked   lisinopril 10 MG tablet Commonly known as:  PRINIVIL,ZESTRIL Take 10 mg by mouth daily.   metoprolol succinate 100 MG 24 hr tablet Commonly known as:  TOPROL-XL Take 1 tablet (100 mg total) by mouth  daily. Take with or immediately following a meal.   multivitamin with minerals Tabs tablet Take 1 tablet by mouth daily.   potassium chloride SA 20 MEQ tablet Commonly known as:  K-DUR,KLOR-CON Take 60 mEq by mouth daily. Take 2 tabs in AM and 1 tab in afternoon.   spironolactone 25 MG tablet Commonly known as:  ALDACTONE Take 25 mg by mouth daily.   triamcinolone 55 MCG/ACT nasal inhaler Commonly known as:  NASACORT Place 2 sprays into the nose daily. 2 sprays in each nostril daily   vitamin C 500 MG tablet Commonly known as:  ASCORBIC ACID Take 500 mg by mouth daily.       Past Surgical History:  He  has a past surgical history that includes Vasectomy; dental implants; Cardiac catheterization; Back surgery; Open Biopsy on Back (08/08/2011); and Esophagogastroduodenoscopy (10/28/2011).  Family History:  His  family history includes Allergies in his mother; Aneurysm in his brother; Heart disease in his father and mother; Lung cancer in his father and mother.  Social History:  He  reports that he has never smoked. He has never used smokeless tobacco. He reports current alcohol use. He reports that he does not use drugs.

## 2018-02-06 ENCOUNTER — Telehealth: Payer: Self-pay | Admitting: Pulmonary Disease

## 2018-02-06 NOTE — Telephone Encounter (Signed)
Auto CPAP 10/21/17 to 01/18/18 >> used on 90 of 90 nights with average 7 hrs 46 min.  Average AHI 1 with median CPAP 9 and 95 th percentile CPAP 14 cm H2O.  Please let him know CPAP download shows good control of sleep apnea with current settings.

## 2018-02-06 NOTE — Telephone Encounter (Signed)
Called and spoke with patient regarding results.  Informed the patient of results and recommendations today. Pt verbalized understanding and denied any questions or concerns at this time.  Pt advised that his machine is prompting an error message in need of new machine. Placed new order with [facility] today for auto cpap machine setting 9-14cm, H2O, mask of choice and supplies. Scheduled ov with VS for 72mo f/u on new cpap machine f/u on 05/07/2018 at 10am Nothing further needed.

## 2018-04-25 ENCOUNTER — Ambulatory Visit: Payer: BC Managed Care – PPO | Admitting: Pulmonary Disease

## 2018-05-09 ENCOUNTER — Ambulatory Visit: Payer: BC Managed Care – PPO | Admitting: Pulmonary Disease

## 2018-05-14 ENCOUNTER — Ambulatory Visit: Payer: BC Managed Care – PPO | Admitting: Pulmonary Disease

## 2018-07-10 ENCOUNTER — Ambulatory Visit: Payer: BC Managed Care – PPO | Admitting: Pulmonary Disease

## 2018-09-05 DIAGNOSIS — G4733 Obstructive sleep apnea (adult) (pediatric): Secondary | ICD-10-CM | POA: Diagnosis not present

## 2019-04-22 DIAGNOSIS — I10 Essential (primary) hypertension: Secondary | ICD-10-CM | POA: Diagnosis not present

## 2019-04-22 DIAGNOSIS — Z125 Encounter for screening for malignant neoplasm of prostate: Secondary | ICD-10-CM | POA: Diagnosis not present

## 2019-04-22 DIAGNOSIS — E7800 Pure hypercholesterolemia, unspecified: Secondary | ICD-10-CM | POA: Diagnosis not present

## 2019-04-25 DIAGNOSIS — N401 Enlarged prostate with lower urinary tract symptoms: Secondary | ICD-10-CM | POA: Diagnosis not present

## 2019-04-25 DIAGNOSIS — Z0001 Encounter for general adult medical examination with abnormal findings: Secondary | ICD-10-CM | POA: Diagnosis not present

## 2019-04-25 DIAGNOSIS — I10 Essential (primary) hypertension: Secondary | ICD-10-CM | POA: Diagnosis not present

## 2019-04-25 DIAGNOSIS — G4733 Obstructive sleep apnea (adult) (pediatric): Secondary | ICD-10-CM | POA: Diagnosis not present

## 2019-04-29 DIAGNOSIS — N529 Male erectile dysfunction, unspecified: Secondary | ICD-10-CM | POA: Diagnosis not present

## 2019-04-29 DIAGNOSIS — M810 Age-related osteoporosis without current pathological fracture: Secondary | ICD-10-CM | POA: Diagnosis not present

## 2019-04-29 DIAGNOSIS — I10 Essential (primary) hypertension: Secondary | ICD-10-CM | POA: Diagnosis not present

## 2019-05-06 DIAGNOSIS — R7303 Prediabetes: Secondary | ICD-10-CM | POA: Diagnosis not present

## 2019-05-06 DIAGNOSIS — I10 Essential (primary) hypertension: Secondary | ICD-10-CM | POA: Diagnosis not present

## 2019-05-13 DIAGNOSIS — R7303 Prediabetes: Secondary | ICD-10-CM | POA: Diagnosis not present

## 2019-05-13 DIAGNOSIS — M810 Age-related osteoporosis without current pathological fracture: Secondary | ICD-10-CM | POA: Diagnosis not present

## 2019-05-13 DIAGNOSIS — I10 Essential (primary) hypertension: Secondary | ICD-10-CM | POA: Diagnosis not present

## 2019-05-24 NOTE — Progress Notes (Signed)
Patient referred by Deland Pretty, MD for abnormal EKG  Subjective:   John Brandt, male    DOB: 11/28/1950, 69 y.o.   MRN: [id number]   Chief Complaint  Patient presents with  . Abnormal ECG  . New Patient (Initial Visit)     HPI  69 y.o. Caucasian male with hypertension, hyperlipidemia, prediabetes, OSA on CPAP, morbid obesity, abnormal EKG  Patient is a retired Dance movement psychotherapist at [employer].  He has had longstanding history of hypertension since his 69s.  He has been working with Dr. Shelia Media and his pharmacist to closely monitor his hypertension and make adjustments in antihypertensive medications as necessary.  He regularly uses CPAP for his OSA.  For the past 1 year, his physical activity has been very limited during the pandemic.  He does have back pain that also limits his physical activity.  He does not do any regular exercise.  He denies any chest pain, shortness of breath with his level of activity.   Past Medical History:  Diagnosis Date  . Allergic rhinitis   . Chronic back pain   . Complication of anesthesia   . Diskitis Aug 2013   t12 with osteomyelitis  . Hx of cardiac cath   . Hyperlipidemia   . Hypertension   . 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 PONV (postoperative nausea and vomiting)   . Sleep apnea    sleep study 07-02-02     Past Surgical History:  Procedure Laterality Date  . BACK SURGERY    . CARDIAC CATHETERIZATION     more than 15 yrs ago  . dental implants    . ESOPHAGOGASTRODUODENOSCOPY  10/28/2011   Procedure: ESOPHAGOGASTRODUODENOSCOPY (EGD);  Surgeon: Lafayette Dragon, MD;  Location: [facility];  Service: Endoscopy;  Laterality: N/A;  . Open Biopsy on Back  08/08/2011  . VASECTOMY       Social History   Tobacco Use  Smoking Status Never Smoker  Smokeless Tobacco Never Used    Social History   Substance and Sexual Activity  Alcohol Use Yes  . Alcohol/week: 0.0 standard drinks   Comment: rare use      Family History  Problem Relation Age of Onset  . Allergies Mother   . Heart disease Mother   . Lung cancer Mother        mesothelioma  . Heart disease Father   . Lung cancer Father   . Aneurysm Brother        brain  . Hypertension Brother   . Hypertension Brother   . Hypertension Brother      Current Outpatient Medications on File Prior to Visit  Medication Sig Dispense Refill  . amLODipine (NORVASC) 10 MG tablet Take 10 mg by mouth daily.     Marland Kitchen aspirin EC 81 MG tablet Take 81 mg by mouth daily.    Marland Kitchen atorvastatin (LIPITOR) 10 MG tablet Take 10 mg by mouth daily.     . calcium carbonate (OS-CAL - DOSED IN MG OF ELEMENTAL CALCIUM) 1250 (500 Ca) MG tablet Take 1 tablet by mouth.    . cholecalciferol (VITAMIN D) 1000 UNITS tablet Take 1,000 Units by mouth daily.    . cloNIDine (CATAPRES) 0.2 MG tablet Take 1 tablet (0.2 mg total) by mouth 3 (three) times daily. Hold unless BP > 140 when checked    . lisinopril (PRINIVIL,ZESTRIL) 10 MG tablet Take 40 mg by mouth daily.     Marland Kitchen  metFORMIN (GLUCOPHAGE) 500 MG tablet Take 500 mg by mouth daily with breakfast.    . metoprolol succinate (TOPROL-XL) 50 MG 24 hr tablet Take 50 mg by mouth daily. Take with or immediately following a meal.    . Multiple Vitamin (MULTIVITAMIN WITH MINERALS) TABS Take 1 tablet by mouth daily.    . potassium chloride SA (K-DUR,KLOR-CON) 20 MEQ tablet Take 60 mEq by mouth daily. Take 2 tabs in AM and 1 tab in afternoon.    Marland Kitchen spironolactone (ALDACTONE) 25 MG tablet Take 25 mg by mouth 2 (two) times daily. 2tab in the morning and 1tab at night    . triamcinolone (NASACORT) 55 MCG/ACT nasal inhaler Place 2 sprays into the nose daily. 2 sprays in each nostril daily    . vitamin C (ASCORBIC ACID) 500 MG tablet Take 500 mg by mouth daily.     No current facility-administered medications on file prior to visit.    Cardiovascular and other pertinent studies:  EKG 05/27/2019: Sinus rhythm 55 bpm IVCD QRS duration  142 msec   Recent labs: 04/22/2019: Glucose 104, BUN/Cr 14/1.21. EGFR 60. Na/K 143/4.0. ALT: 51 H/H 17.3/51.3. MCV 86. Platelets 218 HbA1C N/A Chol 161, TG 180, HDL 41, LDL 31    Review of Systems  Cardiovascular: Positive for leg swelling (chrnic, stable). Negative for chest pain, dyspnea on exertion, palpitations and syncope.         Vitals:   05/27/19 0848 05/27/19 0854  BP: (!) 149/91 (!) 150/88  Pulse: 60 (!) 57  SpO2: 98%      Body mass index is 44.89 kg/m. Filed Weights   05/27/19 0848  Weight: (!) 331 lb (150.1 kg)     Objective:   Physical Exam  Constitutional: No distress.  Neck: No JVD present.  Cardiovascular: Normal rate, regular rhythm, normal heart sounds and intact distal pulses.  No murmur heard. Pulmonary/Chest: Effort normal and breath sounds normal. He has no wheezes. He has no rales.  Musculoskeletal:        General: No edema.  Nursing note and vitals reviewed.        Assessment & Recommendations:   69 y.o. Caucasian male with hypertension, hyperlipidemia, prediabetes, OSA on CPAP, morbid obesity, abnormal EKG  Abnormal EKG: Nonspecific intraventricular conduction delay, likely due to longstanding hypertension.  Will check echocardiogram.   Resistant hypertension: Long standing history. Given the chronicity of his hypertension, I do not think workup for secondary hypertension-including renal artery stenosis will be much beneficial. Continue management with close follow up with Dr. Shelia Media. Next option would be to increase doses of either spironolactone, metoprolol, or clonidine. I encouraged him to add some form of physical activity, as tolerated by his back pain. In addition to treatment of OSA, weight loss could aid blood pressure control.  I will see him on as needed basis, unless echocardiogram shows significant abnormalities.      Thank you for referring the patient to [place]. Please feel free to contact with any  questions.  Nigel Mormon, MD [facility] Pager: [phone] Office: [phone]

## 2019-05-27 ENCOUNTER — Ambulatory Visit: Payer: BC Managed Care – PPO | Admitting: Cardiology

## 2019-05-27 ENCOUNTER — Encounter: Payer: Self-pay | Admitting: Cardiology

## 2019-05-27 ENCOUNTER — Other Ambulatory Visit: Payer: Self-pay

## 2019-05-27 VITALS — BP 150/88 | HR 57 | Ht 72.0 in | Wt 331.0 lb

## 2019-05-27 DIAGNOSIS — I10 Essential (primary) hypertension: Secondary | ICD-10-CM

## 2019-05-27 DIAGNOSIS — R9431 Abnormal electrocardiogram [ECG] [EKG]: Secondary | ICD-10-CM

## 2019-05-27 DIAGNOSIS — G4733 Obstructive sleep apnea (adult) (pediatric): Secondary | ICD-10-CM

## 2019-05-28 ENCOUNTER — Ambulatory Visit: Payer: BC Managed Care – PPO

## 2019-05-28 DIAGNOSIS — I10 Essential (primary) hypertension: Secondary | ICD-10-CM

## 2019-06-04 DIAGNOSIS — R972 Elevated prostate specific antigen [PSA]: Secondary | ICD-10-CM | POA: Diagnosis not present

## 2019-06-04 DIAGNOSIS — N5201 Erectile dysfunction due to arterial insufficiency: Secondary | ICD-10-CM | POA: Diagnosis not present

## 2019-06-07 ENCOUNTER — Other Ambulatory Visit: Payer: Self-pay | Admitting: Urology

## 2019-06-07 DIAGNOSIS — R972 Elevated prostate specific antigen [PSA]: Secondary | ICD-10-CM

## 2019-06-17 DIAGNOSIS — R7301 Impaired fasting glucose: Secondary | ICD-10-CM | POA: Diagnosis not present

## 2019-06-17 DIAGNOSIS — I10 Essential (primary) hypertension: Secondary | ICD-10-CM | POA: Diagnosis not present

## 2019-06-19 DIAGNOSIS — G4733 Obstructive sleep apnea (adult) (pediatric): Secondary | ICD-10-CM | POA: Diagnosis not present

## 2019-06-24 DIAGNOSIS — R7301 Impaired fasting glucose: Secondary | ICD-10-CM | POA: Diagnosis not present

## 2019-06-24 DIAGNOSIS — I10 Essential (primary) hypertension: Secondary | ICD-10-CM | POA: Diagnosis not present

## 2019-06-28 DIAGNOSIS — I10 Essential (primary) hypertension: Secondary | ICD-10-CM | POA: Diagnosis not present

## 2019-07-04 DIAGNOSIS — R7301 Impaired fasting glucose: Secondary | ICD-10-CM | POA: Diagnosis not present

## 2019-07-04 DIAGNOSIS — I10 Essential (primary) hypertension: Secondary | ICD-10-CM | POA: Diagnosis not present

## 2019-07-12 ENCOUNTER — Ambulatory Visit
Admission: RE | Admit: 2019-07-12 | Discharge: 2019-07-12 | Disposition: A | Discharge status: Home / Self Care | Payer: Medicare Other | Source: Ambulatory Visit | Attending: Urology | Admitting: Urology

## 2019-07-12 DIAGNOSIS — N429 Disorder of prostate, unspecified: Secondary | ICD-10-CM | POA: Diagnosis not present

## 2019-07-12 DIAGNOSIS — R972 Elevated prostate specific antigen [PSA]: Secondary | ICD-10-CM | POA: Diagnosis not present

## 2019-07-12 DIAGNOSIS — K5730 Diverticulosis of large intestine without perforation or abscess without bleeding: Secondary | ICD-10-CM | POA: Diagnosis not present

## 2019-07-12 DIAGNOSIS — N400 Enlarged prostate without lower urinary tract symptoms: Secondary | ICD-10-CM | POA: Diagnosis not present

## 2019-07-12 MED ORDER — GADOBENATE DIMEGLUMINE 529 MG/ML IV SOLN
20.0000 mL | Freq: Once | INTRAVENOUS | Status: AC | PRN
  Administered 2019-07-12: 20 mL via INTRAVENOUS

## 2019-07-17 DIAGNOSIS — R7301 Impaired fasting glucose: Secondary | ICD-10-CM | POA: Diagnosis not present

## 2019-07-17 DIAGNOSIS — I10 Essential (primary) hypertension: Secondary | ICD-10-CM | POA: Diagnosis not present

## 2019-07-30 DIAGNOSIS — R972 Elevated prostate specific antigen [PSA]: Secondary | ICD-10-CM | POA: Diagnosis not present

## 2019-07-30 DIAGNOSIS — C61 Malignant neoplasm of prostate: Secondary | ICD-10-CM | POA: Diagnosis not present

## 2019-08-05 DIAGNOSIS — C61 Malignant neoplasm of prostate: Secondary | ICD-10-CM | POA: Diagnosis not present

## 2019-08-05 DIAGNOSIS — N5201 Erectile dysfunction due to arterial insufficiency: Secondary | ICD-10-CM | POA: Diagnosis not present

## 2019-08-05 DIAGNOSIS — R3915 Urgency of urination: Secondary | ICD-10-CM | POA: Diagnosis not present

## 2019-08-14 DIAGNOSIS — I10 Essential (primary) hypertension: Secondary | ICD-10-CM | POA: Diagnosis not present

## 2019-08-14 DIAGNOSIS — Z8546 Personal history of malignant neoplasm of prostate: Secondary | ICD-10-CM | POA: Diagnosis not present

## 2019-08-14 DIAGNOSIS — R7303 Prediabetes: Secondary | ICD-10-CM | POA: Diagnosis not present

## 2019-08-15 ENCOUNTER — Telehealth: Payer: Self-pay | Admitting: Pulmonary Disease

## 2019-08-15 NOTE — Telephone Encounter (Signed)
Patient reporting his CPAP machine has a message that the machine needs to be replaced, it continues to work. Patient directed to call [facility] about a replacement. Patient is having radiation treatments and wants to wait to make an appointment to follow up with Dr. Halford Chessman as he has not been seen since 01/08/2018. Plan is for patient to call us if he needs anything further.

## 2019-08-16 ENCOUNTER — Other Ambulatory Visit: Payer: Self-pay | Admitting: Internal Medicine

## 2019-08-16 ENCOUNTER — Telehealth: Payer: Self-pay | Admitting: Pulmonary Disease

## 2019-08-16 DIAGNOSIS — G4733 Obstructive sleep apnea (adult) (pediatric): Secondary | ICD-10-CM

## 2019-08-16 NOTE — Telephone Encounter (Signed)
Called and spoke with patient he is reporting his CPAP machine has a message that the machine needs to be replaced. He states [facility] told [facility] that pt received a new machine 04/2019. But he did not he states the only thing he got from them was CPAP supplies. Patient has had current machine since 2013. [facility] needs order for new CPAP. Patient was scheduled for an appointment on 8/24 but has canceled due to him recently diagnosed with prostate cancer starting treatments soon. Would like to wait until he is done to be seen and make this his focus.   Dr. Halford Chessman please advise

## 2019-08-16 NOTE — Progress Notes (Unsigned)
amb  

## 2019-08-19 NOTE — Telephone Encounter (Signed)
There was no order that was placed on 02/06/18 for pt to receive a new cpap machine.  I have placed order.   Called and spoke with pt letting him know this had been done and he verbalized understanding. Pt stated he would call to schedule appt once at end of treatments for the prostate cancer. Nothing further needed.

## 2019-08-19 NOTE — Telephone Encounter (Signed)
There is a phone note from John Brandt dated 02/06/18 in which message states she placed an order for new auto CPAP with pressure range of 9 to 14 cm H2O.  However, I do not see the actual order for this being entered.  There are no others orders for CPAP machine since his original order from 03/08/11.  He would have needed a physician order to get a new CPAP machine.  Please confirm whether John Brandt has an order from February 2020 for a new auto CPAP machine.    If they do not have an order, then he should be eligible for a new machine now.  As such, please place an order for a new auto CPAP at 9 to 14 cm H2O with heated humidity.  He would need ROV in 3 months with me or NP.

## 2019-08-21 DIAGNOSIS — I10 Essential (primary) hypertension: Secondary | ICD-10-CM | POA: Diagnosis not present

## 2019-08-22 DIAGNOSIS — R7989 Other specified abnormal findings of blood chemistry: Secondary | ICD-10-CM | POA: Diagnosis not present

## 2019-08-22 DIAGNOSIS — N1832 Chronic kidney disease, stage 3b: Secondary | ICD-10-CM | POA: Diagnosis not present

## 2019-08-22 DIAGNOSIS — I10 Essential (primary) hypertension: Secondary | ICD-10-CM | POA: Diagnosis not present

## 2019-08-22 DIAGNOSIS — N281 Cyst of kidney, acquired: Secondary | ICD-10-CM | POA: Diagnosis not present

## 2019-08-22 DIAGNOSIS — N200 Calculus of kidney: Secondary | ICD-10-CM | POA: Diagnosis not present

## 2019-08-26 ENCOUNTER — Encounter: Payer: Self-pay | Admitting: Radiation Oncology

## 2019-08-26 NOTE — Progress Notes (Signed)
GU Location of Tumor / Histology: prostatic adenocarcinoma  If Prostate Cancer, Gleason Score is (3 + 4) and PSA is (10.5). Prostate volume: 180 gm.  Nelly Rout with a history of BPH now s/p 3 prostate biopsies. Initial biopsy done 04/22/08 (negative) and second 08/16/13 (negative).   Biopsies of prostate (if applicable) revealed:    Past/Anticipated interventions by urology, if any: prostate biopsy x 3, referral to Dr. Tammi Klippel to discuss radiotherapeutic options.  Past/Anticipated interventions by medical oncology, if any: no  Weight changes, if any: Taking ozempic and dieting. Down 31 lb since May.   Bowel/Bladder complaints, if any: IPSS 3. SHIM 10. Denies dysuria, hematuria, urinary leakage or incontinence. Denies any bowel complaints.    Nausea/Vomiting, if any: denies  Pain issues, if any:  denies  SAFETY ISSUES:  Prior radiation? denies  Pacemaker/ICD? denies  Possible current pregnancy? no, male patient  Is the patient on methotrexate? denies  Current Complaints / other details:  69 year old male. Married with 1 son and 1 daughter. Retired Pharmacologist.

## 2019-08-27 ENCOUNTER — Ambulatory Visit: Payer: BC Managed Care – PPO | Admitting: Pulmonary Disease

## 2019-08-27 ENCOUNTER — Encounter: Payer: Self-pay | Admitting: Radiation Oncology

## 2019-08-27 ENCOUNTER — Ambulatory Visit
Admission: RE | Admit: 2019-08-27 | Discharge: 2019-08-27 | Disposition: A | Discharge status: Home / Self Care | Payer: Medicare Other | Source: Ambulatory Visit | Attending: Radiation Oncology | Admitting: Radiation Oncology

## 2019-08-27 ENCOUNTER — Other Ambulatory Visit: Payer: Self-pay

## 2019-08-27 VITALS — Ht 71.0 in | Wt 298.0 lb

## 2019-08-27 DIAGNOSIS — C61 Malignant neoplasm of prostate: Secondary | ICD-10-CM | POA: Insufficient documentation

## 2019-08-27 DIAGNOSIS — Z853 Personal history of malignant neoplasm of breast: Secondary | ICD-10-CM | POA: Diagnosis not present

## 2019-08-27 DIAGNOSIS — R972 Elevated prostate specific antigen [PSA]: Secondary | ICD-10-CM | POA: Diagnosis not present

## 2019-08-27 HISTORY — DX: Malignant neoplasm of prostate: C61

## 2019-08-27 NOTE — Progress Notes (Signed)
Radiation Oncology         [phone] ________________________________  Initial Outpatient Consultation - Conducted via MyChart due to current COVID-19 concerns for limiting patient exposure  Name: John Brandt  Date: 08/27/2019  DOB: May 23, 1950  OH:[id number], Thayer Jew, MD  Ardis Hughs, MD   REFERRING PHYSICIAN: Ardis Hughs, MD  DIAGNOSIS: 69 y.o. gentleman with Stage T1c adenocarcinoma of the prostate with Gleason score of 3+4, and PSA of 10.5.    ICD-10-CM   1. Malignant neoplasm of prostate ([place])  C61     HISTORY OF PRESENT ILLNESS: John Brandt is a 69 y.o. male with a diagnosis of prostate cancer. He has been followed for BPH and elevated PSA since at least 2010, with a prior prostate biopsy performed in 04/2008 by Dr. Gaynelle Arabian that was benign.  His PSA fluctuated between 4 and 6 but got up as high as 7.05 in 2012.  A prostate MRI in 06/2013 was suspicious for prostate adenocarcinoma, but biopsy in 08/2013 was again benign.  More recently, his PSA had increased to 8.13 in May 2019 and up to 10.5 on 04/22/2019.  He had a follow-up visit with Dr. Louis Meckel on 06/04/2019,  digital rectal examination was performed at that time revealing no nodules.  A repeat prostate MRI was performed on 07/12/2019 showing a PI-RADS 5, 3.3 cm mass-like area in the prostatic apex, centered in the right transition zone without evidence of extracapsular extension or pelvic metastatic disease.  The patient proceeded to MRI fusion biopsy of the prostate on 07/30/2019.  The prostate volume measured 170 cc.  Out of 18 core biopsies, 7 were positive.  The maximum Gleason score was 3+4, and this was seen in the right apex and all 6 ROI MRI lesion samples.  The patient reviewed the biopsy results with his urologist and he has kindly been referred today for discussion of potential radiation treatment options.   PREVIOUS RADIATION THERAPY: No  PAST MEDICAL HISTORY:  Past Medical History:    Diagnosis Date  . Allergic rhinitis   . Chronic back pain   . Complication of anesthesia   . Diskitis Aug 2013   t12 with osteomyelitis  . Hx of cardiac cath   . Hyperlipidemia   . Hypertension   . 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 PONV (postoperative nausea and vomiting)   . Prostate cancer ([place])   . Sleep apnea    sleep study 07-02-02      PAST SURGICAL HISTORY: Past Surgical History:  Procedure Laterality Date  . BACK SURGERY    . CARDIAC CATHETERIZATION     more than 15 yrs ago  . dental implants    . ESOPHAGOGASTRODUODENOSCOPY  10/28/2011   Procedure: ESOPHAGOGASTRODUODENOSCOPY (EGD);  Surgeon: Lafayette Dragon, MD;  Location: [facility];  Service: Endoscopy;  Laterality: N/A;  . Open Biopsy on Back  08/08/2011  . PROSTATE BIOPSY    . VASECTOMY      FAMILY HISTORY:  Family History  Problem Relation Age of Onset  . Allergies Mother   . Heart disease Mother   . Lung cancer Mother        mesothelioma  . Breast cancer Mother   . Heart disease Father   . Lung cancer Father   . Aneurysm Brother        brain  . Hypertension Brother   . Hypertension Brother   . Hypertension Brother   .  Kidney cancer Brother   . Colon cancer Maternal Grandmother   . Lung cancer Paternal Grandfather   . Prostate cancer Neg Hx   . Pancreatic cancer Neg Hx     SOCIAL HISTORY:  Social History   Socioeconomic History  . Marital status: Married    Spouse name: Not on file  . Number of children: 2  . Years of education: Not on file  . Highest education level: Not on file  Occupational History  . Occupation: Designer, jewellery: [employer]    Comment: "theater"  Tobacco Use  . Smoking status: Never Smoker  . Smokeless tobacco: Never Used  Vaping Use  . Vaping Use: Never used  Substance and Sexual Activity  . Alcohol use: Yes    Alcohol/week: 0.0 standard drinks    Comment: rare use  . Drug use: No  . Sexual activity: Yes  Other  Topics Concern  . Not on file  Social History Narrative  . Not on file   Social Determinants of Health   Financial Resource Strain:   . Difficulty of Paying Living Expenses: Not on file  Food Insecurity:   . Worried About Charity fundraiser in the Last Year: Not on file  . Ran Out of Food in the Last Year: Not on file  Transportation Needs:   . Lack of Transportation (Medical): Not on file  . Lack of Transportation (Non-Medical): Not on file  Physical Activity:   . Days of Exercise per Week: Not on file  . Minutes of Exercise per Session: Not on file  Stress:   . Feeling of Stress : Not on file  Social Connections:   . Frequency of Communication with Friends and Family: Not on file  . Frequency of Social Gatherings with Friends and Family: Not on file  . Attends Religious Services: Not on file  . Active Member of Clubs or Organizations: Not on file  . Attends Archivist Meetings: Not on file  . Marital Status: Not on file  Intimate Partner Violence:   . Fear of Current or Ex-Partner: Not on file  . Emotionally Abused: Not on file  . Physically Abused: Not on file  . Sexually Abused: Not on file    ALLERGIES: Fish-derived products, Cefazolin, Iodine, and Penicillins  MEDICATIONS:  Current Outpatient Medications  Medication Sig Dispense Refill  . amLODipine (NORVASC) 10 MG tablet Take 10 mg by mouth daily.     Marland Kitchen aspirin EC 81 MG tablet Take 81 mg by mouth daily.    Marland Kitchen atorvastatin (LIPITOR) 10 MG tablet Take 10 mg by mouth daily.     . calcium carbonate (OS-CAL - DOSED IN MG OF ELEMENTAL CALCIUM) 1250 (500 Ca) MG tablet Take 1 tablet by mouth.    . chlorthalidone (HYGROTON) 25 MG tablet Take 25 mg by mouth daily.     . cholecalciferol (VITAMIN D) 1000 UNITS tablet Take 1,000 Units by mouth daily.    . cloNIDine (CATAPRES) 0.2 MG tablet Take 1 tablet (0.2 mg total) by mouth 3 (three) times daily. Hold unless BP > 140 when checked    . denosumab (PROLIA) 60 MG/ML  SOSY injection Inject 60 mg into the skin every 6 (six) months.    Marland Kitchen lisinopril (ZESTRIL) 40 MG tablet Take 40 mg by mouth daily.    . metFORMIN (GLUCOPHAGE) 500 MG tablet Take 500 mg by mouth daily. With dinner    . metoprolol succinate (TOPROL-XL) 50 MG 24 hr  tablet Take 50 mg by mouth daily. Take with or immediately following a meal.    . Multiple Vitamin (MULTIVITAMIN WITH MINERALS) TABS Take 1 tablet by mouth daily.    Marland Kitchen OZEMPIC, 0.25 OR 0.5 MG/DOSE, 2 MG/1.5ML SOPN Inject into the skin.    . potassium chloride SA (K-DUR,KLOR-CON) 20 MEQ tablet Take 60 mEq by mouth daily. Take 2 tabs in AM and 1 tab in afternoon.    Marland Kitchen spironolactone (ALDACTONE) 50 MG tablet Take 50 mg by mouth 2 (two) times daily.    Marland Kitchen triamcinolone (NASACORT) 55 MCG/ACT nasal inhaler Place 2 sprays into the nose daily. 2 sprays in each nostril daily    . vitamin C (ASCORBIC ACID) 500 MG tablet Take 500 mg by mouth daily.     No current facility-administered medications for this encounter.    REVIEW OF SYSTEMS:  On review of systems, the patient reports that he is doing well overall. He denies any chest pain, shortness of breath, cough, fevers, chills, night sweats, unintended weight changes. He denies any bowel disturbances, and denies abdominal pain, nausea or vomiting. He denies any new musculoskeletal or joint aches or pains. His IPSS was 3, indicating mild urinary symptoms. His SHIM was 10, indicating he moderate to severe erectile dysfunction. A complete review of systems is obtained and is otherwise negative.    PHYSICAL EXAM:  Wt Readings from Last 3 Encounters:  08/27/19 298 lb (135.2 kg)  05/27/19 (!) 331 lb (150.1 kg)  01/08/18 (!) 320 lb 12.8 oz (145.5 kg)   Temp Readings from Last 3 Encounters:  04/23/14 97.9 F (36.6 C) (Oral)  08/18/13 98.9 F (37.2 C) (Oral)  10/16/12 97.7 F (36.5 C) (Oral)   BP Readings from Last 3 Encounters:  05/27/19 (!) 150/88  01/08/18 132/86  05/04/16 (!) 148/88    Pulse Readings from Last 3 Encounters:  05/27/19 (!) 57  01/08/18 71  05/04/16 61   Pain Assessment Pain Score: 0-No pain/10  In general this is a well appearing Caucasian gentleman in no acute distress. He's alert and oriented x4 and appropriate throughout the examination. Cardiopulmonary assessment is negative for acute distress and he exhibits normal effort.    KPS = 90  100 - Normal; no complaints; no evidence of disease. 90   - Able to carry on normal activity; minor signs or symptoms of disease. 80   - Normal activity with effort; some signs or symptoms of disease. 46   - Cares for self; unable to carry on normal activity or to do active work. 60   - Requires occasional assistance, but is able to care for most of his personal needs. 50   - Requires considerable assistance and frequent medical care. 56   - Disabled; requires special care and assistance. 69   - Severely disabled; hospital admission is indicated although death not imminent. 27   - Very sick; hospital admission necessary; active supportive treatment necessary. 10   - Moribund; fatal processes progressing rapidly. 0     - Dead  Karnofsky DA, Abelmann [place], Craver LS and Burchenal [facility] [phone]) The use of the nitrogen mustards in the palliative treatment of carcinoma: with particular reference to bronchogenic carcinoma Cancer 1 634-56  LABORATORY DATA:  Lab Results  Component Value Date   WBC 7.1 08/09/2012   HGB 14.0 08/09/2012   HCT 40.5 08/09/2012   MCV 83.3 08/09/2012   PLT 272 08/09/2012   Lab Results  Component Value Date   NA 140  06/07/2012   K 4.1 06/07/2012   CL 104 06/07/2012   CO2 26 06/07/2012   Lab Results  Component Value Date   ALT 14 06/07/2012   AST 18 06/07/2012   ALKPHOS 93 06/07/2012   BILITOT 0.5 06/07/2012     RADIOGRAPHY: No results found.    IMPRESSION/PLAN: This visit was conducted via MyChart to spare the patient unnecessary potential exposure in the healthcare setting  during the current COVID-19 pandemic. 1. 69 y.o. gentleman with Stage T1c adenocarcinoma of the prostate with Gleason Score of 3+4, and PSA of 10.5. We discussed the patient's workup and outlined the nature of prostate cancer in this setting. The patient's T stage, Gleason's score, and PSA put him into the intermediate risk group. Accordingly, he is eligible for a variety of potential treatment options including brachytherapy, 5.5 weeks of external radiation, or prostatectomy. We discussed the available radiation techniques, and focused on the details and logistics of delivery. The patient is not be a candidate for brachytherapy with a prostate volume of 170 cc.  Therefore, we discussed and outlined the risks, benefits, short and long-term effects associated with daily external beam radiotherapy and compared and contrasted these with prostatectomy. We discussed the role of SpaceOAR gel in reducing the rectal toxicity associated with radiotherapy.  He was encouraged to ask questions that were answered to his stated satisfaction.  At the end of the conversation, the patient is interested in moving forward with 5.5 weeks of external beam therapy. We will share our discussion with Dr. Louis Meckel and make arrangements for fiducial markers and SpaceOAR gel placement, first available, prior to simulation, to reduce rectal toxicity from radiotherapy. The patient appears to have a good understanding of his disease and our treatment recommendations which are of curative intent and is in agreement with the stated plan.  Therefore, we will move forward with treatment planning accordingly, in anticipation of beginning IMRT in the near future.  Given current concerns for patient exposure during the COVID-19 pandemic, this encounter was conducted via video-enabled MyChart visit. The patient has given verbal consent for this type of encounter. The time spent during this encounter was 60 minutes. The attendants for this  meeting include Tyler Pita MD, Ashlyn Bruning PA-C, [place], and patient, John Brandt. During the encounter, Tyler Pita MD, Ashlyn Bruning PA-C, and scribe, Wilburn Mylar were located at [place].  Patient, John Brandt were located at home.    Nicholos Johns, PA-C    Tyler Pita, MD  [facility] Direct Dial: [phone]  Fax: [phone] [email or web address]  Skype  [email or web address]  This document serves as a record of services personally performed by Tyler Pita, MD and Freeman Caldron, PA-C. It was created on their behalf by Wilburn Mylar, a trained medical scribe. The creation of this record is based on the scribe's personal observations and the provider's statements to them. This document has been checked and approved by the attending provider.

## 2019-09-02 ENCOUNTER — Telehealth: Payer: Self-pay | Admitting: Pulmonary Disease

## 2019-09-02 NOTE — Telephone Encounter (Signed)
Dr. Halford Chessman please advise on message from [employer] and addendum to notes.

## 2019-09-02 NOTE — Telephone Encounter (Signed)
Response from [employer] for DME order placed by Dr. Halford Chessman on 08/19/19 for cpap:  John Brandt sent to John Brandt; John Brandt, John Brandt; John Brandt,   We received your order, however we are in need of the following to continue to process this through insurance.  1. Addendum to Clinical Notes dated 05/27/19  2. if patient is using and benefiting from cpap machine, can we please get an addendum to notes, for [employer].  Thanks,  Dianna Limbo.

## 2019-09-03 NOTE — Telephone Encounter (Signed)
The clinic note from 05/27/19 was done by cardiology.  I can not make an addendum to another physicians note.

## 2019-09-04 NOTE — Telephone Encounter (Signed)
Response sent to DME.

## 2019-09-06 DIAGNOSIS — R7303 Prediabetes: Secondary | ICD-10-CM | POA: Diagnosis not present

## 2019-09-06 DIAGNOSIS — I10 Essential (primary) hypertension: Secondary | ICD-10-CM | POA: Diagnosis not present

## 2019-09-11 ENCOUNTER — Telehealth: Payer: Self-pay | Admitting: Pulmonary Disease

## 2019-09-11 ENCOUNTER — Telehealth: Payer: Self-pay | Admitting: *Deleted

## 2019-09-11 NOTE — Telephone Encounter (Signed)
Called patient to inform of fid. marker and space oar gel placement on 09-26-19 @ [facility] and his sim will be on September 28 @ Dr. Johny Shears office, spoke with patient and he is aware of these appts.

## 2019-09-11 NOTE — Telephone Encounter (Signed)
Attempted to call pt but unable to reach. Left message for him to return call. °

## 2019-09-12 ENCOUNTER — Other Ambulatory Visit: Payer: Self-pay | Admitting: Urology

## 2019-09-12 DIAGNOSIS — H5213 Myopia, bilateral: Secondary | ICD-10-CM | POA: Diagnosis not present

## 2019-09-12 DIAGNOSIS — C61 Malignant neoplasm of prostate: Secondary | ICD-10-CM

## 2019-09-14 NOTE — Telephone Encounter (Signed)
Attempted to call pt but unable to reach. Left message for him to return call.  When pt returns call, we need to get pt scheduled for an appt with either VS or APP first avail.

## 2019-09-17 NOTE — Telephone Encounter (Signed)
lmtcb for pt. Needs OV with Dr. Halford Chessman or APP.

## 2019-09-19 ENCOUNTER — Encounter: Payer: Self-pay | Admitting: Emergency Medicine

## 2019-09-19 NOTE — Telephone Encounter (Signed)
Due to several unsuccessful attempts to reach pt a letter will be sent to pt's home address listed on file. Will sign off.   Triage, can you please print and mail letter to pt's home address (letter already generated). Thanks.

## 2019-09-19 NOTE — Telephone Encounter (Signed)
Letter has been printed and place in mail for pt. Nothing further needed.

## 2019-09-23 ENCOUNTER — Ambulatory Visit: Payer: Medicare Other | Admitting: Pulmonary Disease

## 2019-09-23 ENCOUNTER — Encounter: Payer: Self-pay | Admitting: Pulmonary Disease

## 2019-09-23 ENCOUNTER — Other Ambulatory Visit: Payer: Self-pay

## 2019-09-23 VITALS — BP 132/88 | HR 71 | Temp 97.3°F | Ht 72.0 in | Wt 300.2 lb

## 2019-09-23 DIAGNOSIS — G4733 Obstructive sleep apnea (adult) (pediatric): Secondary | ICD-10-CM | POA: Diagnosis not present

## 2019-09-23 DIAGNOSIS — E669 Obesity, unspecified: Secondary | ICD-10-CM

## 2019-09-23 DIAGNOSIS — G4730 Sleep apnea, unspecified: Secondary | ICD-10-CM

## 2019-09-23 DIAGNOSIS — Z9989 Dependence on other enabling machines and devices: Secondary | ICD-10-CM | POA: Diagnosis not present

## 2019-09-23 DIAGNOSIS — Z7189 Other specified counseling: Secondary | ICD-10-CM | POA: Diagnosis not present

## 2019-09-23 NOTE — Patient Instructions (Addendum)
Will arrange for new CPAP machine  Follow up in 3 months 

## 2019-09-23 NOTE — Progress Notes (Addendum)
[facility]  Chief Complaint  Patient presents with   Follow-up    having issues with CPAP machine    Constitutional:  BP 132/88 (BP Location: Left Arm, Cuff Size: Normal)    Pulse 71    Temp (!) 97.3 F (36.3 C) (Other (Comment)) Comment (Src): wrist   Ht 6' (1.829 m)    Wt (!) 300 lb 3.2 oz (136.2 kg)    SpO2 99% Comment: room air   BMI 40.71 kg/m   Past Medical History:  HTN, HLD, Diskitis, Chronic back pain, Allergies, Prostate cancer  Past Surgical History:  His  has a past surgical history that includes Vasectomy; dental implants; Cardiac catheterization; Back surgery; Open Biopsy on Back (08/08/2011); Esophagogastroduodenoscopy (10/28/2011); and Prostate biopsy.  Brief Summary:  John Brandt is a 69 y.o. male with obstructive sleep apnea.      Subjective:   He uses CPAP nightly.  His machine is at least 71 years old.  He is getting error message saying machine motor life will expire soon.    He has full face mask. No issues with mask fit.    Goes to bed at 10 pm.  Falls asleep quickly.  Wakes up at 3 am, and then has trouble falling back to sleep and staying asleep.  Gets lots of dreams.  He is to start XRT for prostate cancer later this month.  Plans to get COVID booster next month, and flu shot with PCP later this month.  He was concerned about his blood pressure and pre-DM.  Has changed his diet and lost about 30 lbs.  Physical Exam:   Appearance - well kempt   ENMT - no sinus tenderness, no oral exudate, no LAN, Mallampati 4 airway, no stridor  Respiratory - equal breath sounds bilaterally, no wheezing or rales  CV - s1s2 regular rate and rhythm, no murmurs  Ext - no clubbing, no edema  Skin - no rashes  Psych - normal mood and affect   Sleep Tests:  ONO with CPAP 07/26/16 >> test time 8 hrs 55 min.  Average SpO2 91%, low SpO2 86%.  Spent 49 min with SpO2 < 88%. Auto CPAP 10/21/17 to 01/18/18 >> used on 90  of 90 nights with average 7 hrs 46 min.  Average AHI 1 with median CPAP 9 and 95 th percentile CPAP 14 cm H2O.  Cardiac Tests:  Echo 05/28/19 >> EF 52%, grade 1 DD, mild AR, mild MR  Social History:  He  reports that he has never smoked. He has never used smokeless tobacco. He reports current alcohol use. He reports that he does not use drugs.  Family History:  His family history includes Allergies in his mother; Aneurysm in his brother; Breast cancer in his mother; Colon cancer in his maternal grandmother; Heart disease in his father and mother; Hypertension in his brother, brother, and brother; Kidney cancer in his brother; Lung cancer in his father, mother, and paternal grandfather.     Assessment/Plan:   Obstructive sleep apnea. - he is compliant with CPAP and benefits from therapy - uses [employer] for his DME - his machine is more than 71 yrs old, not functioning properly and not amenable to repair - will arrange for new CPAP machine at 15 cm H2O - explained he might need repeat home sleep study prior to getting new machine for insurance purposes   Obesity. - encouraged him to keep up with weight loss efforts  COVID 19 advice. - discussed timing of vaccine booster  Time Spent Involved in Patient Care on Day of Examination:  23 minutes  Follow up:   Patient Instructions  Will arrange for new CPAP machine  Follow up in 3 months   Medication List:   Allergies as of 09/23/2019       Reactions   Fish-derived Products Nausea And Vomiting   Cefazolin Rash   rash   Iodine Rash   Penicillins Rash        Medication List        Accurate as of September 23, 2019 10:49 AM. If you have any questions, ask your nurse or doctor.          amLODipine 10 MG tablet Commonly known as: NORVASC Take 10 mg by mouth daily.   aspirin EC 81 MG tablet Take 81 mg by mouth daily.   atorvastatin 10 MG tablet Commonly known as: LIPITOR Take 10 mg by mouth daily.   calcium  carbonate 1250 (500 Ca) MG tablet Commonly known as: OS-CAL - dosed in mg of elemental calcium Take 1 tablet by mouth.   chlorthalidone 25 MG tablet Commonly known as: HYGROTON Take 25 mg by mouth daily.   cholecalciferol 1000 units tablet Commonly known as: VITAMIN D Take 1,000 Units by mouth daily.   cloNIDine 0.2 MG tablet Commonly known as: CATAPRES Take 1 tablet (0.2 mg total) by mouth 3 (three) times daily. Hold unless BP > 140 when checked   denosumab 60 MG/ML Sosy injection Commonly known as: PROLIA Inject 60 mg into the skin every 6 (six) months.   lisinopril 40 MG tablet Commonly known as: ZESTRIL Take 40 mg by mouth daily.   metFORMIN 500 MG tablet Commonly known as: GLUCOPHAGE Take 500 mg by mouth daily. With dinner   metoprolol succinate 50 MG 24 hr tablet Commonly known as: TOPROL-XL Take 50 mg by mouth daily. Take with or immediately following a meal.   multivitamin with minerals Tabs tablet Take 1 tablet by mouth daily.   Ozempic (0.25 or 0.5 MG/DOSE) 2 MG/1.5ML Sopn Generic drug: Semaglutide(0.25 or 0.5MG /DOS) Inject into the skin.   potassium chloride SA 20 MEQ tablet Commonly known as: KLOR-CON Take 60 mEq by mouth daily. Take 2 tabs in AM and 1 tab in afternoon.   spironolactone 50 MG tablet Commonly known as: ALDACTONE Take 50 mg by mouth 2 (two) times daily.   triamcinolone 55 MCG/ACT nasal inhaler Commonly known as: NASACORT Place 2 sprays into the nose daily. 2 sprays in each nostril daily   vitamin C 500 MG tablet Commonly known as: ASCORBIC ACID Take 500 mg by mouth daily.        Signature:  Chesley Mires, MD [place] Pager - [phone] 09/23/2019, 10:49 AM

## 2019-09-26 DIAGNOSIS — C61 Malignant neoplasm of prostate: Secondary | ICD-10-CM | POA: Diagnosis not present

## 2019-09-30 ENCOUNTER — Telehealth: Payer: Self-pay | Admitting: *Deleted

## 2019-09-30 NOTE — Telephone Encounter (Signed)
CALLED PATIENT TO REMIND OF SIM AND MRI FOR 10-01-19, SPOKE WITH PATIENT AND HE IS AWARE OF THESE APPTS.

## 2019-10-01 ENCOUNTER — Other Ambulatory Visit: Payer: Self-pay

## 2019-10-01 ENCOUNTER — Encounter: Payer: Self-pay | Admitting: Medical Oncology

## 2019-10-01 ENCOUNTER — Ambulatory Visit
Admission: RE | Admit: 2019-10-01 | Discharge: 2019-10-01 | Disposition: A | Discharge status: Home / Self Care | Payer: Medicare Other | Source: Ambulatory Visit | Attending: Radiation Oncology | Admitting: Radiation Oncology

## 2019-10-01 ENCOUNTER — Ambulatory Visit (HOSPITAL_COMMUNITY)
Admission: RE | Admit: 2019-10-01 | Discharge: 2019-10-01 | Disposition: A | Discharge status: Home / Self Care | Payer: Medicare Other | Source: Ambulatory Visit | Attending: Urology | Admitting: Urology

## 2019-10-01 DIAGNOSIS — C61 Malignant neoplasm of prostate: Secondary | ICD-10-CM | POA: Insufficient documentation

## 2019-10-01 NOTE — Progress Notes (Signed)
  Radiation Oncology         [phone] ________________________________  Name: John Brandt MRN: [id number]  Date: 10/01/2019  DOB: Aug 09, 1950  SIMULATION AND TREATMENT PLANNING NOTE    ICD-10-CM   1. Malignant neoplasm of prostate ([place])  C61     DIAGNOSIS:  69 y.o. gentleman with Stage T1c adenocarcinoma of the prostate with Gleason score of 3+4, and PSA of 10.5.  NARRATIVE:  The patient was brought to the [facility].  Identity was confirmed.  All relevant records and images related to the planned course of therapy were reviewed.  The patient freely provided informed written consent to proceed with treatment after reviewing the details related to the planned course of therapy. The consent form was witnessed and verified by the simulation staff.  Then, the patient was set-up in a stable reproducible supine position for radiation therapy.  A vacuum lock pillow device was custom fabricated to position his legs in a reproducible immobilized position.  Then, I performed a urethrogram under sterile conditions to identify the prostatic apex.  CT images were obtained.  Surface markings were placed.  The CT images were loaded into the planning software.  Then the prostate target and avoidance structures including the rectum, bladder, bowel and hips were contoured.  Treatment planning then occurred.  The radiation prescription was entered and confirmed.  A total of one complex treatment devices was fabricated. I have requested : Intensity Modulated Radiotherapy (IMRT) is medically necessary for this case for the following reason:  Rectal sparing.Marland Kitchen  PLAN:  The patient will receive 70 Gy in 28 fractions.  ________________________________  Sheral Apley Tammi Klippel, M.D.

## 2019-10-03 DIAGNOSIS — N210 Calculus in bladder: Secondary | ICD-10-CM | POA: Diagnosis not present

## 2019-10-03 DIAGNOSIS — N200 Calculus of kidney: Secondary | ICD-10-CM | POA: Diagnosis not present

## 2019-10-03 DIAGNOSIS — I700 Atherosclerosis of aorta: Secondary | ICD-10-CM | POA: Diagnosis not present

## 2019-10-03 DIAGNOSIS — N201 Calculus of ureter: Secondary | ICD-10-CM | POA: Diagnosis not present

## 2019-10-03 DIAGNOSIS — K8020 Calculus of gallbladder without cholecystitis without obstruction: Secondary | ICD-10-CM | POA: Diagnosis not present

## 2019-10-04 DIAGNOSIS — Z23 Encounter for immunization: Secondary | ICD-10-CM | POA: Diagnosis not present

## 2019-10-08 DIAGNOSIS — C61 Malignant neoplasm of prostate: Secondary | ICD-10-CM | POA: Diagnosis not present

## 2019-10-09 ENCOUNTER — Ambulatory Visit
Admission: RE | Admit: 2019-10-09 | Discharge: 2019-10-09 | Disposition: A | Discharge status: Home / Self Care | Payer: Medicare Other | Source: Ambulatory Visit | Attending: Radiation Oncology | Admitting: Radiation Oncology

## 2019-10-09 DIAGNOSIS — C61 Malignant neoplasm of prostate: Secondary | ICD-10-CM | POA: Diagnosis not present

## 2019-10-10 ENCOUNTER — Other Ambulatory Visit: Payer: Self-pay

## 2019-10-10 ENCOUNTER — Ambulatory Visit
Admission: RE | Admit: 2019-10-10 | Discharge: 2019-10-10 | Disposition: A | Discharge status: Home / Self Care | Payer: Medicare Other | Source: Ambulatory Visit | Attending: Radiation Oncology | Admitting: Radiation Oncology

## 2019-10-10 DIAGNOSIS — C61 Malignant neoplasm of prostate: Secondary | ICD-10-CM | POA: Diagnosis not present

## 2019-10-11 ENCOUNTER — Ambulatory Visit
Admission: RE | Admit: 2019-10-11 | Discharge: 2019-10-11 | Disposition: A | Discharge status: Home / Self Care | Payer: Medicare Other | Source: Ambulatory Visit | Attending: Radiation Oncology | Admitting: Radiation Oncology

## 2019-10-11 DIAGNOSIS — C61 Malignant neoplasm of prostate: Secondary | ICD-10-CM | POA: Diagnosis not present

## 2019-10-14 ENCOUNTER — Ambulatory Visit
Admission: RE | Admit: 2019-10-14 | Discharge: 2019-10-14 | Disposition: A | Discharge status: Home / Self Care | Payer: Medicare Other | Source: Ambulatory Visit | Attending: Radiation Oncology | Admitting: Radiation Oncology

## 2019-10-14 DIAGNOSIS — C61 Malignant neoplasm of prostate: Secondary | ICD-10-CM | POA: Diagnosis not present

## 2019-10-15 ENCOUNTER — Ambulatory Visit
Admission: RE | Admit: 2019-10-15 | Discharge: 2019-10-15 | Disposition: A | Discharge status: Home / Self Care | Payer: Medicare Other | Source: Ambulatory Visit | Attending: Radiation Oncology | Admitting: Radiation Oncology

## 2019-10-15 DIAGNOSIS — C61 Malignant neoplasm of prostate: Secondary | ICD-10-CM | POA: Diagnosis not present

## 2019-10-16 ENCOUNTER — Ambulatory Visit
Admission: RE | Admit: 2019-10-16 | Discharge: 2019-10-16 | Disposition: A | Discharge status: Home / Self Care | Payer: Medicare Other | Source: Ambulatory Visit | Attending: Radiation Oncology | Admitting: Radiation Oncology

## 2019-10-16 DIAGNOSIS — C61 Malignant neoplasm of prostate: Secondary | ICD-10-CM | POA: Diagnosis not present

## 2019-10-17 ENCOUNTER — Ambulatory Visit
Admission: RE | Admit: 2019-10-17 | Discharge: 2019-10-17 | Disposition: A | Discharge status: Home / Self Care | Payer: Medicare Other | Source: Ambulatory Visit | Attending: Radiation Oncology | Admitting: Radiation Oncology

## 2019-10-17 DIAGNOSIS — C61 Malignant neoplasm of prostate: Secondary | ICD-10-CM | POA: Diagnosis not present

## 2019-10-18 ENCOUNTER — Other Ambulatory Visit: Payer: Self-pay | Admitting: Radiation Oncology

## 2019-10-18 ENCOUNTER — Ambulatory Visit
Admission: RE | Admit: 2019-10-18 | Discharge: 2019-10-18 | Disposition: A | Discharge status: Home / Self Care | Payer: Medicare Other | Source: Ambulatory Visit | Attending: Radiation Oncology | Admitting: Radiation Oncology

## 2019-10-18 ENCOUNTER — Telehealth: Payer: Self-pay | Admitting: Radiation Oncology

## 2019-10-18 DIAGNOSIS — I10 Essential (primary) hypertension: Secondary | ICD-10-CM | POA: Diagnosis not present

## 2019-10-18 DIAGNOSIS — C61 Malignant neoplasm of prostate: Secondary | ICD-10-CM | POA: Diagnosis not present

## 2019-10-18 MED ORDER — TAMSULOSIN HCL 0.4 MG PO CAPS: 0.4000 mg | ORAL_CAPSULE | Freq: Every day | ORAL | 5 refills | Status: DC

## 2019-10-18 NOTE — Telephone Encounter (Signed)
Received Epic prescription error message that transmission did not complete for tamsulosin script. Phoned [facility]. Left voicemail message on prescriber voicemail with verbal order for tamsulosin as directed by Dr. Tammi Klippel. Provided my direct line for further questions reference this script.

## 2019-10-21 ENCOUNTER — Ambulatory Visit
Admission: RE | Admit: 2019-10-21 | Discharge: 2019-10-21 | Disposition: A | Discharge status: Home / Self Care | Payer: Medicare Other | Source: Ambulatory Visit | Attending: Radiation Oncology | Admitting: Radiation Oncology

## 2019-10-21 DIAGNOSIS — C61 Malignant neoplasm of prostate: Secondary | ICD-10-CM | POA: Diagnosis not present

## 2019-10-22 ENCOUNTER — Ambulatory Visit
Admission: RE | Admit: 2019-10-22 | Discharge: 2019-10-22 | Disposition: A | Discharge status: Home / Self Care | Payer: Medicare Other | Source: Ambulatory Visit | Attending: Radiation Oncology | Admitting: Radiation Oncology

## 2019-10-22 DIAGNOSIS — C61 Malignant neoplasm of prostate: Secondary | ICD-10-CM | POA: Diagnosis not present

## 2019-10-23 ENCOUNTER — Ambulatory Visit
Admission: RE | Admit: 2019-10-23 | Discharge: 2019-10-23 | Disposition: A | Discharge status: Home / Self Care | Payer: Medicare Other | Source: Ambulatory Visit | Attending: Radiation Oncology | Admitting: Radiation Oncology

## 2019-10-23 DIAGNOSIS — C61 Malignant neoplasm of prostate: Secondary | ICD-10-CM | POA: Diagnosis not present

## 2019-10-24 ENCOUNTER — Other Ambulatory Visit: Payer: Self-pay

## 2019-10-24 ENCOUNTER — Ambulatory Visit
Admission: RE | Admit: 2019-10-24 | Discharge: 2019-10-24 | Disposition: A | Discharge status: Home / Self Care | Payer: Medicare Other | Source: Ambulatory Visit | Attending: Radiation Oncology | Admitting: Radiation Oncology

## 2019-10-24 DIAGNOSIS — C61 Malignant neoplasm of prostate: Secondary | ICD-10-CM | POA: Diagnosis not present

## 2019-10-25 ENCOUNTER — Ambulatory Visit
Admission: RE | Admit: 2019-10-25 | Discharge: 2019-10-25 | Disposition: A | Discharge status: Home / Self Care | Payer: Medicare Other | Source: Ambulatory Visit | Attending: Radiation Oncology | Admitting: Radiation Oncology

## 2019-10-25 DIAGNOSIS — C61 Malignant neoplasm of prostate: Secondary | ICD-10-CM | POA: Diagnosis not present

## 2019-10-28 ENCOUNTER — Encounter: Payer: Self-pay | Admitting: Medical Oncology

## 2019-10-28 ENCOUNTER — Ambulatory Visit
Admission: RE | Admit: 2019-10-28 | Discharge: 2019-10-28 | Disposition: A | Discharge status: Home / Self Care | Payer: Medicare Other | Source: Ambulatory Visit | Attending: Radiation Oncology | Admitting: Radiation Oncology

## 2019-10-28 DIAGNOSIS — C61 Malignant neoplasm of prostate: Secondary | ICD-10-CM | POA: Diagnosis not present

## 2019-10-29 ENCOUNTER — Ambulatory Visit
Admission: RE | Admit: 2019-10-29 | Discharge: 2019-10-29 | Disposition: A | Discharge status: Home / Self Care | Payer: Medicare Other | Source: Ambulatory Visit | Attending: Radiation Oncology | Admitting: Radiation Oncology

## 2019-10-29 DIAGNOSIS — C61 Malignant neoplasm of prostate: Secondary | ICD-10-CM | POA: Diagnosis not present

## 2019-10-30 ENCOUNTER — Ambulatory Visit
Admission: RE | Admit: 2019-10-30 | Discharge: 2019-10-30 | Disposition: A | Discharge status: Home / Self Care | Payer: Medicare Other | Source: Ambulatory Visit | Attending: Radiation Oncology | Admitting: Radiation Oncology

## 2019-10-30 DIAGNOSIS — C61 Malignant neoplasm of prostate: Secondary | ICD-10-CM | POA: Diagnosis not present

## 2019-10-31 ENCOUNTER — Ambulatory Visit
Admission: RE | Admit: 2019-10-31 | Discharge: 2019-10-31 | Disposition: A | Discharge status: Home / Self Care | Payer: Medicare Other | Source: Ambulatory Visit | Attending: Radiation Oncology | Admitting: Radiation Oncology

## 2019-10-31 DIAGNOSIS — C61 Malignant neoplasm of prostate: Secondary | ICD-10-CM | POA: Diagnosis not present

## 2019-11-01 ENCOUNTER — Ambulatory Visit
Admission: RE | Admit: 2019-11-01 | Discharge: 2019-11-01 | Disposition: A | Discharge status: Home / Self Care | Payer: Medicare Other | Source: Ambulatory Visit | Attending: Radiation Oncology | Admitting: Radiation Oncology

## 2019-11-01 DIAGNOSIS — C61 Malignant neoplasm of prostate: Secondary | ICD-10-CM | POA: Diagnosis not present

## 2019-11-04 ENCOUNTER — Ambulatory Visit
Admission: RE | Admit: 2019-11-04 | Discharge: 2019-11-04 | Disposition: A | Discharge status: Home / Self Care | Payer: Medicare Other | Source: Ambulatory Visit | Attending: Radiation Oncology | Admitting: Radiation Oncology

## 2019-11-04 DIAGNOSIS — C61 Malignant neoplasm of prostate: Secondary | ICD-10-CM | POA: Insufficient documentation

## 2019-11-05 ENCOUNTER — Ambulatory Visit
Admission: RE | Admit: 2019-11-05 | Discharge: 2019-11-05 | Disposition: A | Discharge status: Home / Self Care | Payer: Medicare Other | Source: Ambulatory Visit | Attending: Radiation Oncology | Admitting: Radiation Oncology

## 2019-11-05 ENCOUNTER — Other Ambulatory Visit: Payer: Self-pay

## 2019-11-05 DIAGNOSIS — C61 Malignant neoplasm of prostate: Secondary | ICD-10-CM | POA: Diagnosis not present

## 2019-11-06 ENCOUNTER — Ambulatory Visit
Admission: RE | Admit: 2019-11-06 | Discharge: 2019-11-06 | Disposition: A | Discharge status: Home / Self Care | Payer: Medicare Other | Source: Ambulatory Visit | Attending: Radiation Oncology | Admitting: Radiation Oncology

## 2019-11-06 ENCOUNTER — Other Ambulatory Visit: Payer: Self-pay

## 2019-11-06 DIAGNOSIS — C61 Malignant neoplasm of prostate: Secondary | ICD-10-CM | POA: Diagnosis not present

## 2019-11-07 ENCOUNTER — Ambulatory Visit
Admission: RE | Admit: 2019-11-07 | Discharge: 2019-11-07 | Disposition: A | Discharge status: Home / Self Care | Payer: Medicare Other | Source: Ambulatory Visit | Attending: Radiation Oncology | Admitting: Radiation Oncology

## 2019-11-07 DIAGNOSIS — C61 Malignant neoplasm of prostate: Secondary | ICD-10-CM | POA: Diagnosis not present

## 2019-11-08 ENCOUNTER — Ambulatory Visit
Admission: RE | Admit: 2019-11-08 | Discharge: 2019-11-08 | Disposition: A | Discharge status: Home / Self Care | Payer: Medicare Other | Source: Ambulatory Visit | Attending: Radiation Oncology | Admitting: Radiation Oncology

## 2019-11-08 DIAGNOSIS — C61 Malignant neoplasm of prostate: Secondary | ICD-10-CM | POA: Diagnosis not present

## 2019-11-11 ENCOUNTER — Ambulatory Visit
Admission: RE | Admit: 2019-11-11 | Discharge: 2019-11-11 | Disposition: A | Discharge status: Home / Self Care | Payer: Medicare Other | Source: Ambulatory Visit | Attending: Radiation Oncology | Admitting: Radiation Oncology

## 2019-11-11 DIAGNOSIS — C61 Malignant neoplasm of prostate: Secondary | ICD-10-CM | POA: Diagnosis not present

## 2019-11-12 ENCOUNTER — Ambulatory Visit
Admission: RE | Admit: 2019-11-12 | Discharge: 2019-11-12 | Disposition: A | Discharge status: Home / Self Care | Payer: Medicare Other | Source: Ambulatory Visit | Attending: Radiation Oncology | Admitting: Radiation Oncology

## 2019-11-12 DIAGNOSIS — C61 Malignant neoplasm of prostate: Secondary | ICD-10-CM | POA: Diagnosis not present

## 2019-11-13 ENCOUNTER — Ambulatory Visit
Admission: RE | Admit: 2019-11-13 | Discharge: 2019-11-13 | Disposition: A | Discharge status: Home / Self Care | Payer: Medicare Other | Source: Ambulatory Visit | Attending: Radiation Oncology | Admitting: Radiation Oncology

## 2019-11-13 DIAGNOSIS — C61 Malignant neoplasm of prostate: Secondary | ICD-10-CM | POA: Diagnosis not present

## 2019-11-14 ENCOUNTER — Ambulatory Visit
Admission: RE | Admit: 2019-11-14 | Discharge: 2019-11-14 | Disposition: A | Discharge status: Home / Self Care | Payer: Medicare Other | Source: Ambulatory Visit | Attending: Radiation Oncology | Admitting: Radiation Oncology

## 2019-11-14 DIAGNOSIS — C61 Malignant neoplasm of prostate: Secondary | ICD-10-CM | POA: Diagnosis not present

## 2019-11-15 ENCOUNTER — Encounter: Payer: Self-pay | Admitting: Urology

## 2019-11-15 ENCOUNTER — Encounter: Payer: Self-pay | Admitting: Medical Oncology

## 2019-11-15 ENCOUNTER — Ambulatory Visit
Admission: RE | Admit: 2019-11-15 | Discharge: 2019-11-15 | Disposition: A | Discharge status: Home / Self Care | Payer: Medicare Other | Source: Ambulatory Visit | Attending: Radiation Oncology | Admitting: Radiation Oncology

## 2019-11-15 DIAGNOSIS — C61 Malignant neoplasm of prostate: Secondary | ICD-10-CM | POA: Diagnosis not present

## 2019-11-18 DIAGNOSIS — M810 Age-related osteoporosis without current pathological fracture: Secondary | ICD-10-CM | POA: Diagnosis not present

## 2019-12-18 ENCOUNTER — Other Ambulatory Visit: Payer: Self-pay

## 2019-12-18 ENCOUNTER — Encounter: Payer: Self-pay | Admitting: Urology

## 2019-12-18 ENCOUNTER — Telehealth: Payer: Self-pay

## 2019-12-18 NOTE — Telephone Encounter (Signed)
Spoke with patient is regards to 1 month telephone follow-up appointment with Freeman Caldron PA on 01/01/20 @ 8:30am. Patient verbalized understanding of appointment date and time. Reviewed meaningful use, AUA and prostate questions. TM

## 2019-12-18 NOTE — Progress Notes (Signed)
Patient has 1 month telephone follow-up appointment with Freeman Caldron PA on 01/01/20. Patient states noturia 1-2 times per night. Patient denies dysuria. Patient states urine stream is moderate and steady. Patient states having regular bowel movements. Patient states having occasional urgency. Patient denies pushing or straining. Patient states occasional hesitancy. Patient denies leakage. Patient states moderate fatigue. Patient states that he has a follow-up appointment with [facility] on 12/31/19.

## 2019-12-31 ENCOUNTER — Telehealth: Payer: Self-pay | Admitting: *Deleted

## 2019-12-31 DIAGNOSIS — N5201 Erectile dysfunction due to arterial insufficiency: Secondary | ICD-10-CM | POA: Diagnosis not present

## 2019-12-31 DIAGNOSIS — Z8546 Personal history of malignant neoplasm of prostate: Secondary | ICD-10-CM | POA: Diagnosis not present

## 2019-12-31 NOTE — Telephone Encounter (Signed)
Called patient to remind that his fu appt. on 01-01-20 @ 8:30 am is over the phone, patient verified understanding this

## 2020-01-01 ENCOUNTER — Ambulatory Visit
Admission: RE | Admit: 2020-01-01 | Discharge: 2020-01-01 | Disposition: A | Discharge status: Home / Self Care | Payer: Medicare Other | Source: Ambulatory Visit | Attending: Urology | Admitting: Urology

## 2020-01-01 DIAGNOSIS — C61 Malignant neoplasm of prostate: Secondary | ICD-10-CM

## 2020-01-01 NOTE — Progress Notes (Signed)
  Radiation Oncology         [phone]) [phone] ________________________________  Name: John Brandt MRN: [id number]  Date: 11/15/2019  DOB: [birth year]  End of Treatment Note  Diagnosis:   69 y.o. gentleman with Stage T1c adenocarcinoma of the prostate with Gleason score of 3+4, and PSA of 10.5.     Indication for treatment:  Curative, Definitive Radiotherapy       Radiation treatment dates:   10/09/19 - 11/15/19  Site/dose:   The prostate was treated to 70 Gy in 28 fractions of 2.5 Gy  Beams/energy:   The patient was treated with IMRT using volumetric arc therapy delivering 6 MV X-rays to clockwise and counterclockwise circumferential arcs with a 90 degree collimator offset to avoid dose scalloping.  Image guidance was performed with daily cone beam CT prior to each fraction to align to gold markers in the prostate and assure proper bladder and rectal fill volumes.  Immobilization was achieved with BodyFix custom mold.  Narrative: The patient tolerated radiation treatment relatively well with only minor urinary irritation and modest fatigue.  He did report mild dysuria at the beginning of his stream as well as increased frequency and urgency all of which were improved on Flomax.  He also reported nocturia 3-4 times per night and modest fatigue but denied any gross hematuria, incomplete bladder emptying, abdominal pain or diarrhea.  Plan: The patient has completed radiation treatment. He will return to radiation oncology clinic for routine followup in one month. I advised him to call or return sooner if he has any questions or concerns related to his recovery or treatment. ________________________________  Artist Pais. Kathrynn Running, M.D.

## 2020-01-01 NOTE — Progress Notes (Signed)
Radiation Oncology         [phone] ________________________________  Name: John Brandt MRN: [id number]  Date: 01/01/2020  DOB: September 26, 1950  Post Treatment Note  CC: Deland Pretty, MD  Ardis Hughs, MD  Diagnosis:   69 y.o. gentleman with Stage T1c adenocarcinoma of the prostate with Gleason score of 3+4, and PSA of 10.5.  Interval Since Last Radiation:  6.5 weeks  10/09/19 - 11/15/19:   The prostate was treated to 70 Gy in 28 fractions of 2.5 Gy  Narrative:  I spoke with the patient to conduct his routine scheduled 1 month follow up visit via telephone to spare the patient unnecessary potential exposure in the healthcare setting during the current COVID-19 pandemic.  The patient was notified in advance and gave permission to proceed with this visit format.  He tolerated radiation treatment relatively well with only minor urinary irritation and modest fatigue.  He did report mild dysuria at the beginning of his stream as well as increased frequency and urgency all of which were improved on Flomax.  He also reported nocturia 3-4 times per night and modest fatigue but denied any gross hematuria, incomplete bladder emptying, abdominal pain or diarrhea.                              On review of systems, the patient states that he is doing well in general.  His nocturia has improved to 1-2 times per night and he continues with occasional urgency and hesitancy but denies excessive daytime frequency, straining to void, incomplete bladder emptying or incontinence.  He continues taking Flomax daily as prescribed.  He denies any abdominal pain or bowel issues and has noticed a gradual return of his energy.  Overall, he is quite pleased with his progress to date.  ALLERGIES:  is allergic to fish-derived products, cefazolin, iodine, and penicillins.  Meds: Current Outpatient Medications  Medication Sig Dispense Refill  . amLODipine (NORVASC) 10 MG tablet Take 10 mg by mouth daily.    Marland Kitchen  aspirin EC 81 MG tablet Take 81 mg by mouth daily.    Marland Kitchen atorvastatin (LIPITOR) 10 MG tablet Take 10 mg by mouth daily.    . calcium carbonate (OS-CAL - DOSED IN MG OF ELEMENTAL CALCIUM) 1250 (500 Ca) MG tablet Take 1 tablet by mouth.    . chlorthalidone (HYGROTON) 25 MG tablet Take 25 mg by mouth daily.     . cholecalciferol (VITAMIN D) 1000 UNITS tablet Take 1,000 Units by mouth daily.    . cloNIDine (CATAPRES) 0.2 MG tablet Take 1 tablet (0.2 mg total) by mouth 3 (three) times daily. Hold unless BP > 140 when checked    . denosumab (PROLIA) 60 MG/ML SOSY injection Inject 60 mg into the skin every 6 (six) months.    Marland Kitchen lisinopril (ZESTRIL) 40 MG tablet Take 40 mg by mouth daily.    . metFORMIN (GLUCOPHAGE) 500 MG tablet Take 500 mg by mouth daily. With dinner    . metoprolol succinate (TOPROL-XL) 50 MG 24 hr tablet Take 50 mg by mouth daily. Take with or immediately following a meal.    . Multiple Vitamin (MULTIVITAMIN WITH MINERALS) TABS Take 1 tablet by mouth daily.    Marland Kitchen OZEMPIC, 0.25 OR 0.5 MG/DOSE, 2 MG/1.5ML SOPN Inject into the skin.    . potassium chloride SA (K-DUR,KLOR-CON) 20 MEQ tablet Take 60 mEq by mouth daily. Take 2 tabs in AM and  1 tab in afternoon.    Marland Kitchen spironolactone (ALDACTONE) 50 MG tablet Take 50 mg by mouth 2 (two) times daily.    . tamsulosin (FLOMAX) 0.4 MG CAPS capsule Take 1 capsule (0.4 mg total) by mouth daily after supper. 30 capsule 5  . triamcinolone (NASACORT) 55 MCG/ACT nasal inhaler Place 2 sprays into the nose daily. 2 sprays in each nostril daily    . vitamin C (ASCORBIC ACID) 500 MG tablet Take 500 mg by mouth daily.     No current facility-administered medications for this encounter.    Physical Findings:  vitals were not taken for this visit.    Unable to assess due to telephone follow-up visit format. Lab Findings: Lab Results  Component Value Date   WBC 7.1 08/09/2012   HGB 14.0 08/09/2012   HCT 40.5 08/09/2012   MCV 83.3 08/09/2012   PLT 272  08/09/2012     Radiographic Findings: No results found.  Impression/Plan: 1. 69 y.o. gentleman with Stage T1c adenocarcinoma of the prostate with Gleason score of 3+4, and PSA of 10.5. He will continue to follow up with urology for ongoing PSA determinations and had a follow-up appointment with Dr. Marlou Porch on 12/31/2019.  He is scheduled for labs and his next follow-up visit in March 2022.  He understands what to expect with regards to PSA monitoring going forward. I will look forward to following his response to treatment via correspondence with urology, and would be happy to continue to participate in his care if clinically indicated. I talked to the patient about what to expect in the future, including his risk for erectile dysfunction and rectal bleeding. I encouraged him to call or return to the office if he has any questions regarding his previous radiation or possible radiation side effects. He was comfortable with this plan and will follow up as needed.    Marguarite Arbour, PA-C

## 2020-02-25 DIAGNOSIS — R7303 Prediabetes: Secondary | ICD-10-CM | POA: Diagnosis not present

## 2020-02-25 DIAGNOSIS — I10 Essential (primary) hypertension: Secondary | ICD-10-CM | POA: Diagnosis not present

## 2020-03-02 DIAGNOSIS — G4733 Obstructive sleep apnea (adult) (pediatric): Secondary | ICD-10-CM | POA: Diagnosis not present

## 2020-03-23 DIAGNOSIS — Z8546 Personal history of malignant neoplasm of prostate: Secondary | ICD-10-CM | POA: Diagnosis not present

## 2020-03-30 DIAGNOSIS — C61 Malignant neoplasm of prostate: Secondary | ICD-10-CM | POA: Diagnosis not present

## 2020-04-08 ENCOUNTER — Other Ambulatory Visit (HOSPITAL_COMMUNITY): Payer: Self-pay | Admitting: Urology

## 2020-04-08 DIAGNOSIS — C61 Malignant neoplasm of prostate: Secondary | ICD-10-CM

## 2020-04-13 ENCOUNTER — Telehealth: Payer: Self-pay | Admitting: Radiation Oncology

## 2020-04-13 NOTE — Telephone Encounter (Signed)
Received via fax PRESCRIPTION REFILL REQUEST for tamsulosin. Faxed denial back. Fax confirmation of delivery obtained. Patient completed radiation in November 2021 and should seek refills of this medication from his urologist.

## 2020-04-21 ENCOUNTER — Ambulatory Visit (HOSPITAL_COMMUNITY)
Admission: RE | Admit: 2020-04-21 | Discharge: 2020-04-21 | Disposition: A | Discharge status: Home / Self Care | Payer: Medicare Other | Source: Ambulatory Visit | Attending: Urology | Admitting: Urology

## 2020-04-21 ENCOUNTER — Encounter (HOSPITAL_COMMUNITY): Payer: Self-pay

## 2020-04-21 ENCOUNTER — Other Ambulatory Visit: Payer: Self-pay

## 2020-04-21 DIAGNOSIS — C61 Malignant neoplasm of prostate: Secondary | ICD-10-CM | POA: Insufficient documentation

## 2020-04-24 DIAGNOSIS — I10 Essential (primary) hypertension: Secondary | ICD-10-CM | POA: Diagnosis not present

## 2020-04-24 DIAGNOSIS — E7800 Pure hypercholesterolemia, unspecified: Secondary | ICD-10-CM | POA: Diagnosis not present

## 2020-04-24 DIAGNOSIS — Z125 Encounter for screening for malignant neoplasm of prostate: Secondary | ICD-10-CM | POA: Diagnosis not present

## 2020-04-27 ENCOUNTER — Other Ambulatory Visit: Payer: Self-pay

## 2020-04-27 ENCOUNTER — Ambulatory Visit (HOSPITAL_COMMUNITY)
Admission: RE | Admit: 2020-04-27 | Discharge: 2020-04-27 | Disposition: A | Discharge status: Home / Self Care | Payer: Medicare Other | Source: Ambulatory Visit | Attending: Urology | Admitting: Urology

## 2020-04-27 DIAGNOSIS — C61 Malignant neoplasm of prostate: Secondary | ICD-10-CM | POA: Diagnosis not present

## 2020-04-27 DIAGNOSIS — I2510 Atherosclerotic heart disease of native coronary artery without angina pectoris: Secondary | ICD-10-CM | POA: Diagnosis not present

## 2020-04-27 DIAGNOSIS — I517 Cardiomegaly: Secondary | ICD-10-CM | POA: Diagnosis not present

## 2020-04-27 DIAGNOSIS — D329 Benign neoplasm of meninges, unspecified: Secondary | ICD-10-CM | POA: Diagnosis not present

## 2020-04-27 DIAGNOSIS — I700 Atherosclerosis of aorta: Secondary | ICD-10-CM | POA: Diagnosis not present

## 2020-04-27 MED ORDER — FLUDEOXYGLUCOSE F - 18 (FDG) INJECTION: 802.0000 | Freq: Once | INTRAVENOUS | Status: DC | PRN

## 2020-04-27 MED ORDER — FLUDEOXYGLUCOSE F - 18 (FDG) INJECTION
8.2000 | Freq: Once | INTRAVENOUS | Status: AC | PRN
  Administered 2020-04-27: 8.2 via INTRAVENOUS

## 2020-04-29 DIAGNOSIS — M810 Age-related osteoporosis without current pathological fracture: Secondary | ICD-10-CM | POA: Diagnosis not present

## 2020-04-29 DIAGNOSIS — G4733 Obstructive sleep apnea (adult) (pediatric): Secondary | ICD-10-CM | POA: Diagnosis not present

## 2020-04-29 DIAGNOSIS — Z0001 Encounter for general adult medical examination with abnormal findings: Secondary | ICD-10-CM | POA: Diagnosis not present

## 2020-04-29 DIAGNOSIS — J309 Allergic rhinitis, unspecified: Secondary | ICD-10-CM | POA: Diagnosis not present

## 2020-05-18 DIAGNOSIS — M810 Age-related osteoporosis without current pathological fracture: Secondary | ICD-10-CM | POA: Diagnosis not present

## 2020-05-26 DIAGNOSIS — I10 Essential (primary) hypertension: Secondary | ICD-10-CM | POA: Diagnosis not present

## 2020-05-26 DIAGNOSIS — R7303 Prediabetes: Secondary | ICD-10-CM | POA: Diagnosis not present

## 2020-06-23 DIAGNOSIS — C61 Malignant neoplasm of prostate: Secondary | ICD-10-CM | POA: Diagnosis not present

## 2020-06-25 DIAGNOSIS — G4733 Obstructive sleep apnea (adult) (pediatric): Secondary | ICD-10-CM | POA: Diagnosis not present

## 2020-06-30 DIAGNOSIS — R351 Nocturia: Secondary | ICD-10-CM | POA: Diagnosis not present

## 2020-06-30 DIAGNOSIS — N5201 Erectile dysfunction due to arterial insufficiency: Secondary | ICD-10-CM | POA: Diagnosis not present

## 2020-06-30 DIAGNOSIS — Z8546 Personal history of malignant neoplasm of prostate: Secondary | ICD-10-CM | POA: Diagnosis not present

## 2020-08-25 DIAGNOSIS — I10 Essential (primary) hypertension: Secondary | ICD-10-CM | POA: Diagnosis not present

## 2020-08-25 DIAGNOSIS — R7303 Prediabetes: Secondary | ICD-10-CM | POA: Diagnosis not present

## 2020-09-30 ENCOUNTER — Telehealth: Payer: Self-pay | Admitting: Pulmonary Disease

## 2020-09-30 DIAGNOSIS — G4733 Obstructive sleep apnea (adult) (pediatric): Secondary | ICD-10-CM

## 2020-09-30 NOTE — Telephone Encounter (Signed)
I called and spoke with patient regarding message. Patient is needing new CPAP machine. Looks like order was sent in by Dr. Halford Chessman 09/21 but [employer] was not able to send one due to recall and his old machine is still working but [employer] can now help, just needing order senf in. I have sent in Dr. Halford Chessman order. Patient will call once received new machine to set up f/u appt per his and Dr. Halford Chessman discussion. Nothing further needed.

## 2020-10-01 DIAGNOSIS — R229 Localized swelling, mass and lump, unspecified: Secondary | ICD-10-CM | POA: Diagnosis not present

## 2020-10-02 DIAGNOSIS — Z23 Encounter for immunization: Secondary | ICD-10-CM | POA: Diagnosis not present

## 2020-10-07 DIAGNOSIS — H524 Presbyopia: Secondary | ICD-10-CM | POA: Diagnosis not present

## 2020-10-08 DIAGNOSIS — G4733 Obstructive sleep apnea (adult) (pediatric): Secondary | ICD-10-CM | POA: Diagnosis not present

## 2020-10-16 NOTE — Telephone Encounter (Signed)
Community message sent to [facility] with [employer] to see if any further information was needed.   Will await F/U.

## 2020-10-20 NOTE — Telephone Encounter (Signed)
New, Rockney Ghee, [place], LPN Hello,   Just to be more specific here is the reply from intake team after [facility] ask for details:   We just need notes from within the last year, signed and dated, that document use and benefit from current pap. Addendums are fine, as long as the notes are within the last year.  If it's broken, then they can document use and benefit prior to it breaking. The Rx is dated 09/30/20, as long as they notes are not dated after that Rx then we're good to go:   Thank you,   Demetrius Charity

## 2020-10-20 NOTE — Telephone Encounter (Signed)
Spoke with the pt and notified of response per [employer]  Pt not seen in the past year  Ov scheduled  Nothing further needed

## 2020-10-26 DIAGNOSIS — C44319 Basal cell carcinoma of skin of other parts of face: Secondary | ICD-10-CM | POA: Diagnosis not present

## 2020-11-06 ENCOUNTER — Ambulatory Visit: Payer: Medicare Other | Admitting: Pulmonary Disease

## 2020-11-06 ENCOUNTER — Encounter: Payer: Self-pay | Admitting: Pulmonary Disease

## 2020-11-06 ENCOUNTER — Other Ambulatory Visit: Payer: Self-pay

## 2020-11-06 VITALS — BP 128/76 | HR 64 | Ht 72.0 in | Wt 296.0 lb

## 2020-11-06 DIAGNOSIS — Z9989 Dependence on other enabling machines and devices: Secondary | ICD-10-CM

## 2020-11-06 DIAGNOSIS — G4733 Obstructive sleep apnea (adult) (pediatric): Secondary | ICD-10-CM | POA: Diagnosis not present

## 2020-11-06 NOTE — Progress Notes (Signed)
[facility]  Chief Complaint  Patient presents with   Follow-up    F/U for CPAP. Machine was ordered last year and he has not received a new machine. Per [employer], he needed a new OV for cpap machine since last order was last year.     Past Surgical History:  He  has a past surgical history that includes Vasectomy; dental implants; Cardiac catheterization; Back surgery; Open Biopsy on Back (08/08/2011); Esophagogastroduodenoscopy (10/28/2011); and Prostate biopsy.  Past Medical History:  HTN, HLD, Diskitis, Chronic back pain, Allergies, Prostate cancer  Constitutional:  BP 128/76   Pulse 64   Ht 6' (1.829 m)   Wt 296 lb (134.3 kg)   SpO2 98%   BMI 40.14 kg/m   Brief Summary:  John Brandt is a 70 y.o. male with obstructive sleep apnea.      Subjective:   He still hasn't received his new CPAP machine.  Order originally placed in September 2021.  Apparently, document needed to state that he was benefiting from therapy.  This issue was not conveyed by DME to our office.  He called in September 2022 to check on status of CPAP machine.  Order again placed on 09/30/20 for new CPAP machine.  Still hasn't received, and then he was told that the documentation is too old he needs a new visit.  He uses CPAP nightly.  No issues with pressure setting or mask fit.  His machine still is sending error message that motor life has expired.  He can't sleep without CPAP.  Physical Exam:   Appearance - well kempt   ENMT - no sinus tenderness, no oral exudate, no LAN, Mallampati 4 airway, no stridor  Respiratory - equal breath sounds bilaterally, no wheezing or rales  CV - s1s2 regular rate and rhythm, no murmurs  Ext - no clubbing, no edema  Skin - no rashes  Psych - normal mood and affect    Sleep Tests:  ONO with CPAP 07/26/16 >> test time 8 hrs 55 min.  Average SpO2 91%, low SpO2 86%.  Spent 49 min with SpO2 < 88%. Auto CPAP 08/08/20 to  1103/22 >> used on 89 of 90 nights with average 8 hrs 12 min.  Average AHI 0.7 with median CPAP 8 and 95 percentile CPAP 13 cm H2O  Cardiac Tests:  Echo 05/28/19 >> EF 52%, grade 1 DD, mild AR, mild MR  Social History:  He  reports that he has never smoked. He has never used smokeless tobacco. He reports current alcohol use. He reports that he does not use drugs.  Family History:  His family history includes Allergies in his mother; Aneurysm in his brother; Breast cancer in his mother; Colon cancer in his maternal grandmother; Heart disease in his father and mother; Hypertension in his brother, brother, and brother; Kidney cancer in his brother; Lung cancer in his father, mother, and paternal grandfather.     Assessment/Plan:   Obstructive sleep apnea. - he is compliant with CPAP and benefits from therapy - he uses [employer] for his DME - his machine is even older now than when the original order was placed for new auto CPAP in September 2021 when it was already more than 72 yrs old; it is still not functioning properly or amenable to repair - placed new order today for auto CPAP 5 to 15 cm H2O with heated humidity - advised him to call office if he doesn't hearing anything from  [employer] within the next few weeks  Obesity. - encouraged him to keep up with weight loss efforts  Time Spent Involved in Patient Care on Day of Examination:  23 minutes  Follow up:   Patient Instructions  Will arrange for new auto CPAP machine  Call the office if you don't hear anything from [employer] about getting your new CPAP machine  Follow up in 5 to 6 months  Medication List:   Allergies as of 11/06/2020       Reactions   Fish-derived Products Nausea And Vomiting   Cefazolin Rash   rash   Iodine Rash   Penicillins Rash        Medication List        Accurate as of November 06, 2020  4:13 PM. If you have any questions, ask your nurse or doctor.          STOP taking these medications     lisinopril 40 MG tablet Commonly known as: ZESTRIL Stopped by: Chesley Mires, MD   Ozempic (0.25 or 0.5 MG/DOSE) 2 MG/1.5ML Sopn Generic drug: Semaglutide(0.25 or 0.5MG /DOS) Stopped by: Chesley Mires, MD   tamsulosin 0.4 MG Caps capsule Commonly known as: Flomax Stopped by: Chesley Mires, MD       TAKE these medications    amLODipine 10 MG tablet Commonly known as: NORVASC Take 10 mg by mouth daily.   aspirin EC 81 MG tablet Take 81 mg by mouth daily.   atorvastatin 10 MG tablet Commonly known as: LIPITOR Take 10 mg by mouth daily.   calcium carbonate 1250 (500 Ca) MG tablet Commonly known as: OS-CAL - dosed in mg of elemental calcium Take 1 tablet by mouth.   chlorthalidone 25 MG tablet Commonly known as: HYGROTON Take 25 mg by mouth daily.   cholecalciferol 1000 units tablet Commonly known as: VITAMIN D Take 1,000 Units by mouth daily.   cloNIDine 0.2 MG tablet Commonly known as: CATAPRES Take 1 tablet (0.2 mg total) by mouth 3 (three) times daily. Hold unless BP > 140 when checked   denosumab 60 MG/ML Sosy injection Commonly known as: PROLIA Inject 60 mg into the skin every 6 (six) months.   metFORMIN 500 MG tablet Commonly known as: GLUCOPHAGE Take 1,000 mg by mouth daily. With dinner   metoprolol succinate 50 MG 24 hr tablet Commonly known as: TOPROL-XL Take 50 mg by mouth daily. Take with or immediately following a meal.   multivitamin with minerals Tabs tablet Take 1 tablet by mouth daily.   potassium chloride SA 20 MEQ tablet Commonly known as: KLOR-CON Take 60 mEq by mouth daily. Take 2 tabs in AM and 1 tab in afternoon.   spironolactone 50 MG tablet Commonly known as: ALDACTONE Take 50 mg by mouth 2 (two) times daily.   triamcinolone 55 MCG/ACT nasal inhaler Commonly known as: NASACORT Place 2 sprays into the nose daily. 2 sprays in each nostril daily   vitamin C 500 MG tablet Commonly known as: ASCORBIC ACID Take 500 mg by mouth  daily.        Signature:  Chesley Mires, MD [place] Pager - [phone] 11/06/2020, 4:13 PM

## 2020-11-06 NOTE — Patient Instructions (Signed)
Will arrange for new auto CPAP machine  Call the office if you don't hear anything from [employer] about getting your new CPAP machine  Follow up in 5 to 6 months

## 2020-11-08 DIAGNOSIS — G4733 Obstructive sleep apnea (adult) (pediatric): Secondary | ICD-10-CM | POA: Diagnosis not present

## 2020-11-23 DIAGNOSIS — M810 Age-related osteoporosis without current pathological fracture: Secondary | ICD-10-CM | POA: Diagnosis not present

## 2020-12-02 DIAGNOSIS — Z85828 Personal history of other malignant neoplasm of skin: Secondary | ICD-10-CM | POA: Diagnosis not present

## 2020-12-02 DIAGNOSIS — Z08 Encounter for follow-up examination after completed treatment for malignant neoplasm: Secondary | ICD-10-CM | POA: Diagnosis not present

## 2020-12-08 DIAGNOSIS — G4733 Obstructive sleep apnea (adult) (pediatric): Secondary | ICD-10-CM | POA: Diagnosis not present

## 2020-12-09 DIAGNOSIS — H00015 Hordeolum externum left lower eyelid: Secondary | ICD-10-CM | POA: Diagnosis not present

## 2020-12-15 DIAGNOSIS — Z8546 Personal history of malignant neoplasm of prostate: Secondary | ICD-10-CM | POA: Diagnosis not present

## 2020-12-21 ENCOUNTER — Telehealth: Payer: Self-pay | Admitting: Pulmonary Disease

## 2020-12-21 NOTE — Telephone Encounter (Signed)
I have sent a community message to [employer] to have them check on this for the pt.

## 2020-12-22 NOTE — Telephone Encounter (Signed)
New, Nelwyn Salisbury, LPN; New, Jonathon Jordan,   Order has been received and updated info was also received. I have message the intake team to review and contact patient.   Thank you,   Brad New   [employer] to contact patient, nothing further needed at this time.

## 2020-12-25 DIAGNOSIS — N5201 Erectile dysfunction due to arterial insufficiency: Secondary | ICD-10-CM | POA: Diagnosis not present

## 2020-12-25 DIAGNOSIS — Z8546 Personal history of malignant neoplasm of prostate: Secondary | ICD-10-CM | POA: Diagnosis not present

## 2021-02-21 DIAGNOSIS — G4733 Obstructive sleep apnea (adult) (pediatric): Secondary | ICD-10-CM | POA: Diagnosis not present

## 2021-02-21 DIAGNOSIS — I10 Essential (primary) hypertension: Secondary | ICD-10-CM | POA: Diagnosis not present

## 2021-03-09 ENCOUNTER — Other Ambulatory Visit: Payer: Self-pay

## 2021-03-09 ENCOUNTER — Encounter: Payer: Self-pay | Admitting: Pulmonary Disease

## 2021-03-09 ENCOUNTER — Ambulatory Visit: Payer: Medicare Other | Admitting: Pulmonary Disease

## 2021-03-09 VITALS — BP 136/88 | HR 52 | Temp 97.6°F | Ht 71.0 in | Wt 290.2 lb

## 2021-03-09 DIAGNOSIS — E669 Obesity, unspecified: Secondary | ICD-10-CM | POA: Diagnosis not present

## 2021-03-09 DIAGNOSIS — G4733 Obstructive sleep apnea (adult) (pediatric): Secondary | ICD-10-CM

## 2021-03-09 DIAGNOSIS — G4730 Sleep apnea, unspecified: Secondary | ICD-10-CM

## 2021-03-09 NOTE — Progress Notes (Signed)
? ?John Brandt, John Brandt, John Brandt ? ?Chief Complaint  ?Patient presents with  ? Follow-up  ?  Follow up. Patient says everything is going good with his new cpap machine.   ? ? ?Past Surgical History:  ?He  has a past surgical history that includes Vasectomy; dental implants; Cardiac catheterization; Back surgery; Open Biopsy on Back (08/08/2011); Esophagogastroduodenoscopy (10/28/2011); John Prostate biopsy. ? ?Past Medical History:  ?HTN, HLD, Diskitis, Chronic back pain, Allergies, Prostate cancer ? ?Constitutional:  ?BP 136/88 (BP Location: Right Arm, Patient Position: Sitting, Cuff Size: Normal)   Pulse (!) 52   Temp 97.6 ?F (36.4 ?C) (Oral)   Ht '5\' 11"'$  (1.803 m)   Wt 290 lb 3.2 oz (131.6 kg)   SpO2 100%   BMI 40.47 kg/m?  ? ?Brief Summary:  ?John Brandt is a 71 y.o. male with obstructive sleep apnea. ?  ? ? ? ?Subjective:  ? ?He finally got his new CPAP machine.  Working well.  No issues with mask fit or pressure setting.  Not having mouth dryness.  He was under a lot of stress at home after his wife required surgery.  She is recovering well now.  He has been working on his weight.  He is hoping to lose another 20 lbs. ? ?Physical Exam:  ? ?Appearance - well kempt  ? ?ENMT - no sinus tenderness, no oral exudate, no LAN, Mallampati 4 airway, no stridor ? ?Respiratory - equal breath sounds bilaterally, no wheezing or rales ? ?CV - s1s2 regular rate John rhythm, no murmurs ? ?Ext - no clubbing, no edema ? ?Skin - no rashes ? ?Psych - normal mood John affect ? ? ?  ?Sleep Tests:  ?ONO with CPAP 07/26/16 >> test time 8 hrs 55 min.  Average SpO2 91%, low SpO2 86%.  Spent 49 min with SpO2 < 88%. ?Auto CPAP 02/06/21 to 03/07/21 >> used on 30 of 30 nights with average 8 hrs 5 min.  Average AHI 1.5 with median CPAP 7 John 95 th percentile CPAP 11 cm H2O ? ?Cardiac Tests:  ?Echo 05/28/19 >> EF 52%, grade 1 DD, mild AR, mild MR ? ?Social History:  ?He  reports that he has never smoked. He has never  used smokeless tobacco. He reports current alcohol use. He reports that he does not use drugs. ? ?Family History:  ?His family history includes Allergies in his mother; Aneurysm in his brother; Breast cancer in his mother; Colon cancer in his maternal grandmother; Heart disease in his father John mother; Hypertension in his brother, brother, John brother; Kidney cancer in his brother; Lung cancer in his father, mother, John paternal grandfather. ?  ? ? ?Assessment/Plan:  ? ?Obstructive sleep apnea. ?- he is compliant with CPAP John benefits from therapy ?- he uses [employer] for his DME ?- continue auto CPAP 5 to 15 cm H2O ?- advised he could download [employer] APP onto his phone to help monitor his therapy ? ?Obesity. ?- he is hoping to lose another 20 lbs through diet modification ?- explained we could revisit CPAP therapy if he is able to lose a significant amount of weight ? ?Time Spent Involved in Patient Brandt on Day of Examination:  ?26 minutes ? ?Follow up:  ? ?Patient Instructions  ?Follow up in 1 year ? ?Medication List:  ? ?Allergies as of 03/09/2021   ? ?   Reactions  ? Fish-derived Products Nausea John Vomiting  ? Cefazolin Rash  ? rash  ?  Iodine Rash  ? Penicillins Rash  ? ?  ? ?  ?Medication List  ?  ? ?  ? Accurate as of March 09, 2021  9:34 AM. If you have any questions, ask your nurse or doctor.  ?  ?  ? ?  ? ?amLODipine 10 MG tablet ?Commonly known as: NORVASC ?Take 10 mg by mouth daily. ?  ?aspirin EC 81 MG tablet ?Take 81 mg by mouth daily. ?  ?atorvastatin 10 MG tablet ?Commonly known as: LIPITOR ?Take 10 mg by mouth daily. ?  ?calcium carbonate 1250 (500 Ca) MG tablet ?Commonly known as: OS-CAL - dosed in mg of elemental calcium ?Take 1 tablet by mouth. ?  ?chlorthalidone 25 MG tablet ?Commonly known as: HYGROTON ?Take 25 mg by mouth daily. ?  ?cholecalciferol 1000 units tablet ?Commonly known as: VITAMIN D ?Take 1,000 Units by mouth daily. ?  ?cloNIDine 0.2 MG tablet ?Commonly known as: CATAPRES ?Take 1  tablet (0.2 mg total) by mouth 3 (three) times daily. Hold unless BP > 140 when checked ?  ?denosumab 60 MG/ML Sosy injection ?Commonly known as: PROLIA ?Inject 60 mg into the skin every 6 (six) months. ?  ?metFORMIN 500 MG tablet ?Commonly known as: GLUCOPHAGE ?Take 1,000 mg by mouth daily. With dinner ?  ?metoprolol succinate 50 MG 24 hr tablet ?Commonly known as: TOPROL-XL ?Take 50 mg by mouth daily. Take with or immediately following a meal. ?  ?multivitamin with minerals Tabs tablet ?Take 1 tablet by mouth daily. ?  ?potassium chloride SA 20 MEQ tablet ?Commonly known as: KLOR-CON M ?Take 60 mEq by mouth daily. Take 2 tabs in AM John 1 tab in afternoon. ?  ?spironolactone 50 MG tablet ?Commonly known as: ALDACTONE ?Take 50 mg by mouth 2 (two) times daily. ?  ?triamcinolone 55 MCG/ACT nasal inhaler ?Commonly known as: NASACORT ?Place 2 sprays into the nose daily. 2 sprays in each nostril daily ?  ?vitamin C 500 MG tablet ?Commonly known as: ASCORBIC ACID ?Take 500 mg by mouth daily. ?  ? ?  ? ? ?Signature:  ?Chesley Mires, MD ?[place] ?Pager - [phone] ?03/09/2021, 9:34 AM ?  ? ? ? ? ? ? ? ? ?

## 2021-03-09 NOTE — Patient Instructions (Signed)
Follow up in 1 year.

## 2021-03-21 DIAGNOSIS — G4733 Obstructive sleep apnea (adult) (pediatric): Secondary | ICD-10-CM | POA: Diagnosis not present

## 2021-03-21 DIAGNOSIS — I10 Essential (primary) hypertension: Secondary | ICD-10-CM | POA: Diagnosis not present

## 2021-03-30 DIAGNOSIS — R7303 Prediabetes: Secondary | ICD-10-CM | POA: Diagnosis not present

## 2021-03-30 DIAGNOSIS — I10 Essential (primary) hypertension: Secondary | ICD-10-CM | POA: Diagnosis not present

## 2021-04-21 DIAGNOSIS — G4733 Obstructive sleep apnea (adult) (pediatric): Secondary | ICD-10-CM | POA: Diagnosis not present

## 2021-04-21 DIAGNOSIS — I10 Essential (primary) hypertension: Secondary | ICD-10-CM | POA: Diagnosis not present

## 2021-05-03 DIAGNOSIS — I10 Essential (primary) hypertension: Secondary | ICD-10-CM | POA: Diagnosis not present

## 2021-05-03 DIAGNOSIS — Z125 Encounter for screening for malignant neoplasm of prostate: Secondary | ICD-10-CM | POA: Diagnosis not present

## 2021-05-03 DIAGNOSIS — E7801 Familial hypercholesterolemia: Secondary | ICD-10-CM | POA: Diagnosis not present

## 2021-05-05 DIAGNOSIS — N1832 Chronic kidney disease, stage 3b: Secondary | ICD-10-CM | POA: Diagnosis not present

## 2021-05-05 DIAGNOSIS — J309 Allergic rhinitis, unspecified: Secondary | ICD-10-CM | POA: Diagnosis not present

## 2021-05-05 DIAGNOSIS — Z0000 Encounter for general adult medical examination without abnormal findings: Secondary | ICD-10-CM | POA: Diagnosis not present

## 2021-05-05 DIAGNOSIS — C61 Malignant neoplasm of prostate: Secondary | ICD-10-CM | POA: Diagnosis not present

## 2021-05-06 ENCOUNTER — Other Ambulatory Visit: Payer: Self-pay | Admitting: Internal Medicine

## 2021-05-06 DIAGNOSIS — I700 Atherosclerosis of aorta: Secondary | ICD-10-CM

## 2021-05-25 DIAGNOSIS — M810 Age-related osteoporosis without current pathological fracture: Secondary | ICD-10-CM | POA: Diagnosis not present

## 2021-05-30 NOTE — Progress Notes (Unsigned)
Cardiology Office Note   Date:  06/04/2021   ID:  John Brandt [birth year], MRN [id number]  PCP:  Merri Brunette, MD  Cardiologist:   Ayuub Penley [place], MD   Chief Complaint  Patient presents with   Abnormal ECG   Coronary Artery Disease      History of Present Illness: John Brandt is a 71 y.o. male who is seen at the request of Dr Renne Crigler for evaluation of abnormal Ecg and eelevated coronary calcium score. He has a history of HTN and HLD. He has OSA. Was noted in 2021 to have a wide IVCD on Ecg. Echo at the time showed moderate LVH with normal EF. Mild AI and MR. Reports cardiac cath in the distant past in the early 90s in [place]. He had recent coronary calcium score done which was elevated at 1601. There was also an ascending aortic aneurysm measuring 4.5 cm.   He is seen today with his wife. He states he is doing well. BP has been well controlled. Has prediabetes treated with metformin and is going to try Ozempic again for weight loss. His activity is limited by back problems. Walks with a cane. Does a lot of writing and sitting at his computer. He does have chronic LE edema L>>>R. Had prior RT for prostate CA. Family history of MI and cerebral aneurysm. He denies any chest pain or dyspnea at his activity level. No palpitations.   Past Medical History:  Diagnosis Date   Allergic rhinitis    Chronic back pain    Complication of anesthesia    Diskitis 08/2011   t12 with osteomyelitis   Duodenitis    Esophagitis    Hx of cardiac cath    Hyperlipidemia    Hypertension    Paraspinal abscess (HCC) 08/2011   MSSA - treated 6 weeks IV abx   PONV (postoperative nausea and vomiting)    Pre-diabetes    Prostate cancer (HCC)    Sleep apnea    sleep study 07-02-02    Past Surgical History:  Procedure Laterality Date   BACK SURGERY     CARDIAC CATHETERIZATION     more than 15 yrs ago   dental implants     ESOPHAGOGASTRODUODENOSCOPY  10/28/2011   Procedure:  ESOPHAGOGASTRODUODENOSCOPY (EGD);  Surgeon: Hart Carwin, MD;  Location: [facility];  Service: Endoscopy;  Laterality: N/A;   Open Biopsy on Back  08/08/2011   PROSTATE BIOPSY     VASECTOMY       Current Outpatient Medications  Medication Sig Dispense Refill   amLODipine (NORVASC) 10 MG tablet Take 10 mg by mouth daily.     aspirin EC 81 MG tablet Take 81 mg by mouth daily.     atorvastatin (LIPITOR) 20 MG tablet Take 1 tablet (20 mg total) by mouth daily. 90 tablet 3   calcium carbonate (OS-CAL - DOSED IN MG OF ELEMENTAL CALCIUM) 1250 (500 Ca) MG tablet Take 1 tablet by mouth.     chlorthalidone (HYGROTON) 25 MG tablet Take 25 mg by mouth daily.      cholecalciferol (VITAMIN D) 1000 UNITS tablet Take 1,000 Units by mouth daily.     cloNIDine (CATAPRES) 0.2 MG tablet Take 1 tablet (0.2 mg total) by mouth 3 (three) times daily. Hold unless BP > 140 when checked     denosumab (PROLIA) 60 MG/ML SOSY injection Inject 60 mg into the skin every 6 (six) months.     metFORMIN (GLUCOPHAGE) 500 MG tablet  Take 1,000 mg by mouth daily. With dinner     metoprolol succinate (TOPROL-XL) 50 MG 24 hr tablet Take 50 mg by mouth daily. Take with or immediately following a meal.     Multiple Vitamin (MULTIVITAMIN WITH MINERALS) TABS Take 1 tablet by mouth daily.     potassium chloride SA (K-DUR,KLOR-CON) 20 MEQ tablet Take 60 mEq by mouth daily. Take 2 tabs in AM and 1 tab in afternoon.     spironolactone (ALDACTONE) 50 MG tablet Take 50 mg by mouth 2 (two) times daily.     triamcinolone (NASACORT) 55 MCG/ACT nasal inhaler Place 2 sprays into the nose daily. 2 sprays in each nostril daily     vitamin C (ASCORBIC ACID) 500 MG tablet Take 500 mg by mouth daily.     No current facility-administered medications for this visit.    Allergies:   Fish-derived products, Cefazolin, Iodine, and Penicillins    Social History:  The patient  reports that he has never smoked. He has never used smokeless tobacco. He  reports current alcohol use. He reports that he does not use drugs.   Family History:  The patient's family history includes Allergies in his mother; Aneurysm in his brother; Breast cancer in his mother; Colon cancer in his maternal grandmother; Heart attack in his father; Heart disease in his father and mother; Hypertension in his brother, brother, and brother; Kidney cancer in his brother; Lung cancer in his father, mother, and paternal grandfather.    ROS:  Please see the history of present illness.   Otherwise, review of systems are positive for none.   All other systems are reviewed and negative.    PHYSICAL EXAM: VS:  BP 122/78   Pulse 60   Ht 5' 10.5" (1.791 m)   Wt (!) 307 lb 9.6 oz (139.5 kg)   SpO2 97%   BMI 43.51 kg/m  , BMI Body mass index is 43.51 kg/m. GEN: Well nourished, morbidly obese,  in no acute distress HEENT: normal Neck: no JVD, carotid bruits, or masses Cardiac: RRR; no murmurs, rubs, or gallops,no edema  Respiratory:  clear to auscultation bilaterally, normal work of breathing GI: soft, nontender, nondistended, + BS MS: no deformity or atrophy. 3+ LLE edema below knee. 1-2+ RLE edema.  Skin: warm and dry, no rash Neuro:  Strength and sensation are intact Psych: euthymic mood, full affect   EKG:  EKG is ordered today. The ekg ordered today demonstrates NSR rate 60. LAD, nonspecific IVCD. No change from 2021. I have personally reviewed and interpreted this study.    Recent Labs: No results found for requested labs within last 8760 hours.    Lipid Panel No results found for: CHOL, TRIG, HDL, CHOLHDL, VLDL, LDLCALC, LDLDIRECT  Dated 04/24/20: creatinine 1.37. eGFR 55. Otherwise CMET normal. Dated 03/20/21: A1c 5.2%.   Dated 05/03/21: cholesterol 164, triglycerides 215, HDL 42, non HDL 122. LDL 79. CBC normal. Creatinine 1.52. otherwise CMET normal.   Wt Readings from Last 3 Encounters:  06/04/21 (!) 307 lb 9.6 oz (139.5 kg)  03/09/21 290 lb 3.2 oz  (131.6 kg)  11/06/20 296 lb (134.3 kg)      Other studies Reviewed: Additional studies/ records that were reviewed today include:  Echo 05/28/19: Echocardiogram 05/28/2019:  Technically difficult study.  Left ventricle cavity is normal in size. Moderate concentric hypertrophy  of the left ventricle. Normal LV systolic function with EF 52%. Normal  global wall motion. Doppler evidence of grade I (impaired) diastolic  dysfunction, normal LAP.  Trileaflet aortic valve.  Mild (Grade I) aortic regurgitation.  Mild (Grade I) mitral regurgitation.  Inadequate TR jet to estimate pulmonary artery systolic pressure. Normal  right atrial pressure.  The aortic root is mildly dilated at 4.1 cm.  CLINICAL DATA:  Hypertension   EXAM: CT CARDIAC CORONARY ARTERY CALCIUM SCORE   TECHNIQUE: Non-contrast imaging through the heart was performed using prospective ECG gating. Image post processing was performed on an independent workstation, allowing for quantitative analysis of the heart and coronary arteries. Note that this exam targets the heart and the chest was not imaged in its entirety.   COMPARISON:  None Available.   FINDINGS: CORONARY CALCIUM SCORES:   Left Main: 19   LAD: 589   LCx: 93.2   RCA: 899   Total Agatston Score: 1601   MESA database percentile: 90   AORTA MEASUREMENTS:   Ascending Aorta: 45 mm   Descending Aorta: 30 mm   OTHER FINDINGS:   Heart is normal size. Aneurysmal dilatation of the ascending thoracic aorta measuring 4.5 cm. Scattered aortic calcifications. Calcified mediastinal and right hilar lymph nodes compatible with old granulomatous disease. Calcified granuloma in the inferior right upper lobe. No confluent opacities or effusions. No acute findings in the upper abdomen. Bilateral gynecomastia. No acute bony abnormality.   IMPRESSION: Total Agatston score: 1601   [place] database percentile: 90   4.5 cm ascending thoracic aortic aneurysm.  Recommend semi-annual imaging followup by CTA or MRA and referral to cardiothoracic surgery if not already obtained. This recommendation follows 2010 ACCF/AHA/AATS/ACR/ASA/SCA/SCAI/SIR/STS/SVM Guidelines for the Diagnosis and Management of Patients With Thoracic Aortic Disease. Circulation. 2010; 121: I203-T597. Aortic aneurysm NOS (ICD10-I71.9)   Aortic atherosclerosis.     Electronically Signed   By: Rolm Baptise M.D.   On: 06/01/2021 23:10      ASSESSMENT AND PLAN:  1.  High coronary calcium score. 1601. Patient without significant symptoms but activity is very sedentary. Risk is at least intermediate. Recommend further ischemic evaluation. Discussed options of Nuclear stress testing, coronary CTA or cath. Cath not indicated currently. I think CT will be limited by high calcium score. I would favor Lexiscan PET stress testing. If low risk this would argue for continued risk factor modification. If higher risk would need to consider cardiac cath. Will follow up post stress test 2. Thoracic aortic aneurysm 4.5 cm. Noted incidentally on CT. Will need annual follow up with contrast CT. Will need pretreatment for die allergy. Limit contrast load with CKD 3. HTN. Well controlled. 4. HLD mixed. Goal LDL <<70 given high calcium score. Will increase lipitor to 20 mg daily. Repeat lab in 3 months 5. Pre diabetes. Per PCP. Encourage efforts at weight loss 6. CKD stage 3a 7. Chronic LE edema. L>>R 8. Morbid obesity with OSA on CPAP. 9. Contrast allergy.   Current medicines are reviewed at length with the patient today.  The patient does not have concerns regarding medicines.  The following changes have been made:  increase lipitor to 20 mg daily.   Labs/ tests ordered today include:   Orders Placed This Encounter  Procedures   NM PET CT CARDIAC PERFUSION Post BLOODFLOW   Cardiac Stress Test: Informed Consent Details: Physician/Practitioner Attestation; Transcribe to consent  form and obtain patient signature   EKG 12-Lead         Disposition:   FU with me after stress test  Signed, Izaan Kingbird [place], MD  06/04/2021 8:39 AM    [facility] [address] [place], [place], [address] Phone [phone], Fax [phone]

## 2021-06-01 ENCOUNTER — Ambulatory Visit
Admission: RE | Admit: 2021-06-01 | Discharge: 2021-06-01 | Disposition: A | Discharge status: Home / Self Care | Payer: Medicare Other | Source: Ambulatory Visit | Attending: Internal Medicine | Admitting: Internal Medicine

## 2021-06-01 DIAGNOSIS — R7301 Impaired fasting glucose: Secondary | ICD-10-CM | POA: Diagnosis not present

## 2021-06-01 DIAGNOSIS — M810 Age-related osteoporosis without current pathological fracture: Secondary | ICD-10-CM | POA: Diagnosis not present

## 2021-06-01 DIAGNOSIS — I700 Atherosclerosis of aorta: Secondary | ICD-10-CM

## 2021-06-01 DIAGNOSIS — G4733 Obstructive sleep apnea (adult) (pediatric): Secondary | ICD-10-CM | POA: Diagnosis not present

## 2021-06-01 DIAGNOSIS — I10 Essential (primary) hypertension: Secondary | ICD-10-CM | POA: Diagnosis not present

## 2021-06-02 DIAGNOSIS — Z1283 Encounter for screening for malignant neoplasm of skin: Secondary | ICD-10-CM | POA: Diagnosis not present

## 2021-06-02 DIAGNOSIS — D225 Melanocytic nevi of trunk: Secondary | ICD-10-CM | POA: Diagnosis not present

## 2021-06-04 ENCOUNTER — Ambulatory Visit: Payer: Medicare Other | Admitting: Cardiology

## 2021-06-04 ENCOUNTER — Encounter: Payer: Self-pay | Admitting: Cardiology

## 2021-06-04 VITALS — BP 122/78 | HR 60 | Ht 70.5 in | Wt 307.6 lb

## 2021-06-04 DIAGNOSIS — J8410 Pulmonary fibrosis, unspecified: Secondary | ICD-10-CM | POA: Diagnosis not present

## 2021-06-04 DIAGNOSIS — I7121 Aneurysm of the ascending aorta, without rupture: Secondary | ICD-10-CM

## 2021-06-04 DIAGNOSIS — I10 Essential (primary) hypertension: Secondary | ICD-10-CM | POA: Diagnosis not present

## 2021-06-04 DIAGNOSIS — R7303 Prediabetes: Secondary | ICD-10-CM

## 2021-06-04 DIAGNOSIS — I2510 Atherosclerotic heart disease of native coronary artery without angina pectoris: Secondary | ICD-10-CM

## 2021-06-04 DIAGNOSIS — I2584 Coronary atherosclerosis due to calcified coronary lesion: Secondary | ICD-10-CM

## 2021-06-04 DIAGNOSIS — G4733 Obstructive sleep apnea (adult) (pediatric): Secondary | ICD-10-CM

## 2021-06-04 DIAGNOSIS — E782 Mixed hyperlipidemia: Secondary | ICD-10-CM

## 2021-06-04 MED ORDER — ATORVASTATIN CALCIUM 20 MG PO TABS: 20.0000 mg | ORAL_TABLET | Freq: Every day | ORAL | 3 refills | Status: DC

## 2021-06-04 NOTE — Patient Instructions (Addendum)
Medication Instructions:   Increase lipitor  ( atorvastatin ) to 20 mg daily   *If you need a refill on your cardiac medications before your next appointment, please call your pharmacy*   Lab Work:  Not needed   Testing/Procedures:  How to Prepare for Your Cardiac PET/CT Stress Test:  1. Please do not take these medications before your test:   Medications that may interfere with the cardiac pharmacological stress agent (ex. nitrates or beta-blockers) the day of the exam. Theophylline containing medications for 12 hours. Dipyridamole 48 hours prior to the test. Your remaining medications may be taken with water.  2. Nothing to eat or drink, except water, 3 hours prior to arrival time.   NO caffeine/decaffeinated products, or chocolate 12 hours prior to arrival.  3. NO perfume, cologne or lotion  4. Total time is 1 to 2 hours; you may want to bring reading material for the waiting time.  5. Please report to Admitting at the [facility] Main Entrance 60 minutes early for your test.  [place], [place] [address]  Diabetic Preparation:  Hold oral medications. You may take NPH and Lantus insulin. Do not take Humalog or Humulin R (Regular Insulin) the day of your test. Check blood sugars prior to leaving the house. If able to eat breakfast prior to 3 hour fasting, you may take all medications, including your insulin, Do not worry if you miss your breakfast dose of insulin - start at your next meal.    In preparation for your appointment, medication and supplies will be purchased.  Appointment availability is limited, so if you need to cancel or reschedule, please call the Radiology Department at [phone]  24 hours in advance to avoid a cancellation fee of $100.00  What to Expect After you Arrive:  Once you arrive and check in for your appointment, you will be taken to a preparation room within the Radiology Department.  A technologist or Nurse will  obtain your medical history, verify that you are correctly prepped for the exam, and explain the procedure.  Afterwards,  an IV will be started in your arm and electrodes will be placed on your skin for EKG monitoring during the stress portion of the exam. Then you will be escorted to the PET/CT scanner.  There, staff will get you positioned on the scanner and obtain a blood pressure and EKG.  During the exam, you will continue to be connected to the EKG and blood pressure machines.  A small, safe amount of a radioactive tracer will be injected in your IV to obtain a series of pictures of your heart along with an injection of a stress agent.    After your Exam:  It is recommended that you eat a meal and drink a caffeinated beverage to counter act any effects of the stress agent.  Drink plenty of fluids for the remainder of the day and urinate frequently for the first couple of hours after the exam.  Your doctor will inform you of your test results within 7-10 business days.  For questions about your test or how to prepare for your test, please call: Marchia Bond, Cardiac Imaging Nurse Navigator  Gordy Clement, Cardiac Imaging Nurse Navigator Office: [phone]   Follow-Up: At [facility], you and your health needs are our priority.  As part of our continuing mission to provide you with exceptional heart care, we have created designated Provider Care Teams.  These Care Teams include your primary  Cardiologist (physician) and Advanced Practice Providers (APPs -  Physician Assistants and Nurse Practitioners) who all work together to provide you with the care you need, when you need it.     Your next appointment:   3 month(s)  The format for your next appointment:   In Person  Provider:   Peter Martinique, MD

## 2021-06-16 DIAGNOSIS — Z8546 Personal history of malignant neoplasm of prostate: Secondary | ICD-10-CM | POA: Diagnosis not present

## 2021-06-21 DIAGNOSIS — Z8546 Personal history of malignant neoplasm of prostate: Secondary | ICD-10-CM | POA: Diagnosis not present

## 2021-06-21 DIAGNOSIS — R351 Nocturia: Secondary | ICD-10-CM | POA: Diagnosis not present

## 2021-07-02 DIAGNOSIS — I10 Essential (primary) hypertension: Secondary | ICD-10-CM | POA: Diagnosis not present

## 2021-07-02 DIAGNOSIS — G4733 Obstructive sleep apnea (adult) (pediatric): Secondary | ICD-10-CM | POA: Diagnosis not present

## 2021-07-14 DIAGNOSIS — I10 Essential (primary) hypertension: Secondary | ICD-10-CM | POA: Diagnosis not present

## 2021-07-14 DIAGNOSIS — G4733 Obstructive sleep apnea (adult) (pediatric): Secondary | ICD-10-CM | POA: Diagnosis not present

## 2021-07-16 DIAGNOSIS — E7801 Familial hypercholesterolemia: Secondary | ICD-10-CM | POA: Diagnosis not present

## 2021-07-19 DIAGNOSIS — J8410 Pulmonary fibrosis, unspecified: Secondary | ICD-10-CM | POA: Diagnosis not present

## 2021-08-01 DIAGNOSIS — I10 Essential (primary) hypertension: Secondary | ICD-10-CM | POA: Diagnosis not present

## 2021-08-01 DIAGNOSIS — G4733 Obstructive sleep apnea (adult) (pediatric): Secondary | ICD-10-CM | POA: Diagnosis not present

## 2021-08-03 DIAGNOSIS — R7301 Impaired fasting glucose: Secondary | ICD-10-CM | POA: Diagnosis not present

## 2021-08-03 DIAGNOSIS — I10 Essential (primary) hypertension: Secondary | ICD-10-CM | POA: Diagnosis not present

## 2021-09-13 ENCOUNTER — Telehealth (HOSPITAL_COMMUNITY): Payer: Self-pay | Admitting: *Deleted

## 2021-09-13 NOTE — Telephone Encounter (Signed)
Reaching out to patient to offer assistance regarding upcoming cardiac imaging study; pt verbalizes understanding of appt date/time, parking situation and where to check in, pre-test NPO status and verified current allergies; name and call back number provided for further questions should they arise  Gordy Clement RN Navigator Cardiac [place] [facility] [phone] office [phone] cell  Patient aware to avoid caffeine 12 hours prior to his Cardiac PET scan.

## 2021-09-14 ENCOUNTER — Encounter (HOSPITAL_COMMUNITY)
Admission: RE | Admit: 2021-09-14 | Discharge: 2021-09-14 | Disposition: A | Discharge status: Home / Self Care | Payer: Medicare Other | Source: Ambulatory Visit | Attending: Cardiology | Admitting: Cardiology

## 2021-09-14 DIAGNOSIS — I2510 Atherosclerotic heart disease of native coronary artery without angina pectoris: Secondary | ICD-10-CM | POA: Diagnosis not present

## 2021-09-14 DIAGNOSIS — I2584 Coronary atherosclerosis due to calcified coronary lesion: Secondary | ICD-10-CM

## 2021-09-14 DIAGNOSIS — R7303 Prediabetes: Secondary | ICD-10-CM | POA: Diagnosis not present

## 2021-09-14 DIAGNOSIS — E782 Mixed hyperlipidemia: Secondary | ICD-10-CM | POA: Diagnosis not present

## 2021-09-14 LAB — NM PET CT CARDIAC PERFUSION MULTI W/ABSOLUTE BLOODFLOW
MBFR: 2.35
Nuc Rest EF: 54 %
Nuc Stress EF: 64 %
Rest MBF: 0.86 ml/g/min
ST Depression (mm): 0 mm
Stress MBF: 2.02 ml/g/min
TID: 1.06

## 2021-09-14 MED ORDER — RUBIDIUM RB82 GENERATOR (RUBYFILL)
29.9400 | PACK | Freq: Once | INTRAVENOUS | Status: AC
  Administered 2021-09-14: 29.94 via INTRAVENOUS

## 2021-09-14 MED ORDER — REGADENOSON 0.4 MG/5ML IV SOLN: 0.4000 mg | Freq: Once | INTRAVENOUS | Status: AC

## 2021-09-14 MED ORDER — RUBIDIUM RB82 GENERATOR (RUBYFILL)
29.9000 | PACK | Freq: Once | INTRAVENOUS | Status: AC
  Administered 2021-09-14: 29.9 via INTRAVENOUS

## 2021-09-14 MED ORDER — REGADENOSON 0.4 MG/5ML IV SOLN
INTRAVENOUS | Status: AC
  Administered 2021-09-14: 0.4 mg via INTRAVENOUS
  Filled 2021-09-14: qty 5

## 2021-09-14 NOTE — Progress Notes (Signed)
Patient presents for a cardiac PET stress test and tolerated procedure without incident. Patient maintained acceptable vital signs throughout the test and was offered caffeine after test.  Patient ambulated out of department with a steady gait.  

## 2021-09-20 ENCOUNTER — Ambulatory Visit: Payer: Medicare Other | Admitting: Cardiology

## 2021-10-06 DIAGNOSIS — I10 Essential (primary) hypertension: Secondary | ICD-10-CM | POA: Diagnosis not present

## 2021-10-06 DIAGNOSIS — R7303 Prediabetes: Secondary | ICD-10-CM | POA: Diagnosis not present

## 2021-10-06 DIAGNOSIS — R7301 Impaired fasting glucose: Secondary | ICD-10-CM | POA: Diagnosis not present

## 2021-10-07 DIAGNOSIS — H5203 Hypermetropia, bilateral: Secondary | ICD-10-CM | POA: Diagnosis not present

## 2021-10-14 DIAGNOSIS — G4733 Obstructive sleep apnea (adult) (pediatric): Secondary | ICD-10-CM | POA: Diagnosis not present

## 2021-10-14 DIAGNOSIS — I10 Essential (primary) hypertension: Secondary | ICD-10-CM | POA: Diagnosis not present

## 2021-11-04 DIAGNOSIS — H524 Presbyopia: Secondary | ICD-10-CM | POA: Diagnosis not present

## 2021-11-29 DIAGNOSIS — M810 Age-related osteoporosis without current pathological fracture: Secondary | ICD-10-CM | POA: Diagnosis not present

## 2021-12-14 DIAGNOSIS — Z8546 Personal history of malignant neoplasm of prostate: Secondary | ICD-10-CM | POA: Diagnosis not present

## 2021-12-16 DIAGNOSIS — Z8709 Personal history of other diseases of the respiratory system: Secondary | ICD-10-CM | POA: Diagnosis not present

## 2021-12-21 DIAGNOSIS — R351 Nocturia: Secondary | ICD-10-CM | POA: Diagnosis not present

## 2021-12-21 DIAGNOSIS — N5201 Erectile dysfunction due to arterial insufficiency: Secondary | ICD-10-CM | POA: Diagnosis not present

## 2022-01-06 DIAGNOSIS — Z79899 Other long term (current) drug therapy: Secondary | ICD-10-CM | POA: Diagnosis not present

## 2022-01-06 DIAGNOSIS — R7301 Impaired fasting glucose: Secondary | ICD-10-CM | POA: Diagnosis not present

## 2022-01-06 DIAGNOSIS — I10 Essential (primary) hypertension: Secondary | ICD-10-CM | POA: Diagnosis not present

## 2022-01-06 DIAGNOSIS — M810 Age-related osteoporosis without current pathological fracture: Secondary | ICD-10-CM | POA: Diagnosis not present

## 2022-01-06 DIAGNOSIS — R7303 Prediabetes: Secondary | ICD-10-CM | POA: Diagnosis not present

## 2022-01-06 DIAGNOSIS — Z23 Encounter for immunization: Secondary | ICD-10-CM | POA: Diagnosis not present

## 2022-01-06 DIAGNOSIS — E7800 Pure hypercholesterolemia, unspecified: Secondary | ICD-10-CM | POA: Diagnosis not present

## 2022-01-27 DIAGNOSIS — I10 Essential (primary) hypertension: Secondary | ICD-10-CM | POA: Diagnosis not present

## 2022-01-27 DIAGNOSIS — G4733 Obstructive sleep apnea (adult) (pediatric): Secondary | ICD-10-CM | POA: Diagnosis not present

## 2022-02-27 DIAGNOSIS — I10 Essential (primary) hypertension: Secondary | ICD-10-CM | POA: Diagnosis not present

## 2022-02-27 DIAGNOSIS — G4733 Obstructive sleep apnea (adult) (pediatric): Secondary | ICD-10-CM | POA: Diagnosis not present

## 2022-03-10 ENCOUNTER — Telehealth: Payer: Self-pay

## 2022-03-10 NOTE — Patient Instructions (Signed)
Visit Information  Thank you for taking time to visit with me today. Please don't hesitate to contact me if I can be of assistance to you.   Following are the goals we discussed today:   Goals Addressed             This Visit's Progress    COMPLETED: care coordination activities-no follow up required       Care Coordination Interventions: Advised patient to Annual Wellness exam. Discussed [facility] services and support. Assessed SDOH. Advised to discuss with primary care physician if services needed in the future.  Interventions Today    Flowsheet Row Most Recent Value  General Interventions   General Interventions Discussed/Reviewed Doctor Visits, Health Screening  Doctor Visits Discussed/Reviewed Doctor Visits Discussed, Annual Wellness Visits  Health Screening Prostate  Education Interventions   Education Provided Provided Education  Mental Health Interventions   Mental Health Discussed/Reviewed --  [Yearly health screenings]             If you are experiencing a Mental Health or [place] or need someone to talk to, please call the Suicide and Crisis Lifeline: 988   Patient verbalizes understanding of instructions and care plan provided today and agrees to view in [place]. Active MyChart status and patient understanding of how to access instructions and care plan via MyChart confirmed with patient.     No further follow up required: decline  Jone Baseman, RN, MSN [place] [facility] Care Management Coordinator Direct Line [phone]

## 2022-03-10 NOTE — Patient Outreach (Signed)
  Care Coordination   03/10/2022 Name: John Brandt MRN: [id number] DOB: 1950-12-16   Care Coordination Outreach Attempts:  An unsuccessful telephone outreach was attempted today to offer the patient information about available care coordination services as a benefit of their health plan.   Follow Up Plan:  Additional outreach attempts will be made to offer the patient care coordination information and services.   Encounter Outcome:  No Answer   Care Coordination Interventions:  No, not indicated    Jone Baseman, RN, MSN [place] [facility] Care Management Coordinator Direct Line [phone]

## 2022-03-10 NOTE — Patient Outreach (Signed)
  Care Coordination   Initial Visit Note   03/10/2022 Name: CHRISTOPHEN LEVANDOWSKI MRN: [id number] DOB: 12-27-1950  John Brandt is a 72 y.o. year old male who sees Deland Pretty, MD for primary care. I spoke with  Nelly Rout by phone today.  What matters to the patients health and wellness today?  none    Goals Addressed             This Visit's Progress    COMPLETED: care coordination activities-no follow up required       Care Coordination Interventions: Advised patient to Annual Wellness exam. Discussed [facility] services and support. Assessed SDOH. Advised to discuss with primary care physician if services needed in the future.  Interventions Today    Flowsheet Row Most Recent Value  General Interventions   General Interventions Discussed/Reviewed Doctor Visits, Health Screening  Doctor Visits Discussed/Reviewed Doctor Visits Discussed, Annual Wellness Visits  Health Screening Prostate  Education Interventions   Education Provided Provided Education  Mental Health Interventions   Mental Health Discussed/Reviewed --  [Yearly health screenings]             SDOH assessments and interventions completed:  Yes  SDOH Interventions Today    Flowsheet Row Most Recent Value  SDOH Interventions   Food Insecurity Interventions Intervention Not Indicated  Housing Interventions Intervention Not Indicated        Care Coordination Interventions:  Yes, provided   Follow up plan: No further intervention required.   Encounter Outcome:  Pt. Visit Completed   Jone Baseman, RN, MSN [place] [facility] Care Management Coordinator Direct Line [phone]

## 2022-03-28 DIAGNOSIS — I10 Essential (primary) hypertension: Secondary | ICD-10-CM | POA: Diagnosis not present

## 2022-03-28 DIAGNOSIS — G4733 Obstructive sleep apnea (adult) (pediatric): Secondary | ICD-10-CM | POA: Diagnosis not present

## 2022-04-01 DIAGNOSIS — G4733 Obstructive sleep apnea (adult) (pediatric): Secondary | ICD-10-CM | POA: Diagnosis not present

## 2022-04-01 DIAGNOSIS — I10 Essential (primary) hypertension: Secondary | ICD-10-CM | POA: Diagnosis not present

## 2022-04-04 DIAGNOSIS — Z23 Encounter for immunization: Secondary | ICD-10-CM | POA: Diagnosis not present

## 2022-05-05 DIAGNOSIS — R7303 Prediabetes: Secondary | ICD-10-CM | POA: Diagnosis not present

## 2022-05-05 DIAGNOSIS — Z125 Encounter for screening for malignant neoplasm of prostate: Secondary | ICD-10-CM | POA: Diagnosis not present

## 2022-05-05 DIAGNOSIS — E7800 Pure hypercholesterolemia, unspecified: Secondary | ICD-10-CM | POA: Diagnosis not present

## 2022-05-05 LAB — LAB REPORT - SCANNED: A1c: 5.7

## 2022-05-10 DIAGNOSIS — I700 Atherosclerosis of aorta: Secondary | ICD-10-CM | POA: Diagnosis not present

## 2022-05-10 DIAGNOSIS — Z6841 Body Mass Index (BMI) 40.0 and over, adult: Secondary | ICD-10-CM | POA: Diagnosis not present

## 2022-05-10 DIAGNOSIS — Z0000 Encounter for general adult medical examination without abnormal findings: Secondary | ICD-10-CM | POA: Diagnosis not present

## 2022-05-31 DIAGNOSIS — M810 Age-related osteoporosis without current pathological fracture: Secondary | ICD-10-CM | POA: Diagnosis not present

## 2022-06-09 ENCOUNTER — Ambulatory Visit (HOSPITAL_BASED_OUTPATIENT_CLINIC_OR_DEPARTMENT_OTHER): Payer: Medicare Other | Admitting: Pulmonary Disease

## 2022-06-09 ENCOUNTER — Encounter (HOSPITAL_BASED_OUTPATIENT_CLINIC_OR_DEPARTMENT_OTHER): Payer: Self-pay | Admitting: Pulmonary Disease

## 2022-06-09 VITALS — BP 118/74 | HR 73 | Ht 70.5 in | Wt 308.0 lb

## 2022-06-09 DIAGNOSIS — G4730 Sleep apnea, unspecified: Secondary | ICD-10-CM

## 2022-06-09 DIAGNOSIS — E669 Obesity, unspecified: Secondary | ICD-10-CM | POA: Diagnosis not present

## 2022-06-09 DIAGNOSIS — G4733 Obstructive sleep apnea (adult) (pediatric): Secondary | ICD-10-CM | POA: Diagnosis not present

## 2022-06-09 NOTE — Progress Notes (Signed)
[facility]  Chief Complaint  Patient presents with   Follow-up    33yr f/u for OSA. States he has been doing well since last visit. Uses machine nightly.     Past Surgical History:  He  has a past surgical history that includes Vasectomy; dental implants; Cardiac catheterization; Back surgery; Open Biopsy on Back (08/08/2011); Esophagogastroduodenoscopy (10/28/2011); and Prostate biopsy.  Past Medical History:  HTN, HLD, Diskitis, Chronic back pain, Allergies, Prostate cancer  Constitutional:  BP 118/74   Pulse 73   Ht 5' 10.5" (1.791 m)   Wt (!) 308 lb (139.7 kg)   SpO2 98% Comment: on RA  BMI 43.57 kg/m   Brief Summary:  John Brandt is a 72 y.o. male with obstructive sleep apnea.      Subjective:   He uses CPAP nightly.  No issues with mask fit.  Has mild mouth dryness in the morning.  No issue with his sinuses.  He tried ozempic.  He was able to lose about 12 lbs, but had terrible nausea even with lower doses.  He has trouble exercising due to back pain.  He plans to get a foot pedal he can use while he is writing.  He had an abnormal ECG last year.  He had cardiac PET perfusion est with cardiology that was okay.    Physical Exam:   Appearance - well kempt   ENMT - no sinus tenderness, no oral exudate, no LAN, Mallampati 4 airway, no stridor  Respiratory - equal breath sounds bilaterally, no wheezing or rales  CV - s1s2 regular rate and rhythm, no murmurs  Ext - no clubbing, no edema  Skin - no rashes  Psych - normal mood and affect     Chest Imaging:  Cardiac CT 06/01/21 >> 4.5 cm ascending thoracic aortic aneurysm  Sleep Tests:  ONO with CPAP 07/26/16 >> test time 8 hrs 55 min.  Average SpO2 91%, low SpO2 86%.  Spent 49 min with SpO2 < 88%. Auto CPAP 03/09/22 to 06/06/22 >> used on 90 of 90 nights with average 8 hrs 25 min.  Average AHI 3.2 with median CPAP 8 and 95 th percentile CPAP 12 cm H2O  Cardiac  Tests:  Echo 05/28/19 >> EF 52%, grade 1 DD, mild AR, mild MR  Social History:  He  reports that he has never smoked. He has never used smokeless tobacco. He reports current alcohol use. He reports that he does not use drugs.  Family History:  His family history includes Allergies in his mother; Aneurysm in his brother; Breast cancer in his mother; Colon cancer in his maternal grandmother; Heart attack in his father; Heart disease in his father and mother; Hypertension in his brother, brother, and brother; Kidney cancer in his brother; Lung cancer in his father, mother, and paternal grandfather.     Assessment/Plan:   Obstructive sleep apnea. - he is compliant with CPAP and benefits from therapy - he uses [employer] for his DME - current CPAP ordered November 2022 - continue auto CPAP 5 to 15 cm H2O  Obesity. - he wasn't able to tolerate ozempic due to nausea - he will follow up with his PCP for alternative options to ozempic - he plans to get a foot pedal to increase his exercise - discussed how weight loss can improve his sleep apnea  Ascending thoracic aorta aneurysm, Coronary calcification. - followed by Dr. Peter [place] with cardiology  Time Spent Involved in  Patient Care on Day of Examination:  25 minutes  Follow up:   Patient Instructions  Follow up in 1 year  Medication List:   Allergies as of 06/09/2022       Reactions   Fish-derived Products Nausea And Vomiting   Cefazolin Rash   rash   Iodine Rash   Penicillins Rash        Medication List        Accurate as of June 09, 2022  8:56 AM. If you have any questions, ask your nurse or doctor.          STOP taking these medications    calcium carbonate 1250 (500 Ca) MG tablet Commonly known as: OS-CAL - dosed in mg of elemental calcium Stopped by: Coralyn Helling, MD       TAKE these medications    amLODipine 10 MG tablet Commonly known as: NORVASC Take 10 mg by mouth daily.   ascorbic acid 500 MG  tablet Commonly known as: VITAMIN C Take 500 mg by mouth daily.   aspirin EC 81 MG tablet Take 81 mg by mouth daily.   atorvastatin 20 MG tablet Commonly known as: LIPITOR Take 1 tablet (20 mg total) by mouth daily.   chlorthalidone 25 MG tablet Commonly known as: HYGROTON Take 25 mg by mouth daily.   cholecalciferol 1000 units tablet Commonly known as: VITAMIN D Take 1,000 Units by mouth daily.   cloNIDine 0.2 MG tablet Commonly known as: CATAPRES Take 1 tablet (0.2 mg total) by mouth 3 (three) times daily. Hold unless BP > 140 when checked   denosumab 60 MG/ML Sosy injection Commonly known as: PROLIA Inject 60 mg into the skin every 6 (six) months.   metFORMIN 500 MG tablet Commonly known as: GLUCOPHAGE Take 1,000 mg by mouth daily. With dinner   metoprolol succinate 50 MG 24 hr tablet Commonly known as: TOPROL-XL Take 50 mg by mouth daily. Take with or immediately following a meal.   multivitamin with minerals Tabs tablet Take 1 tablet by mouth daily.   potassium chloride SA 20 MEQ tablet Commonly known as: KLOR-CON M Take 60 mEq by mouth daily. Take 2 tabs in AM and 1 tab in afternoon.   spironolactone 50 MG tablet Commonly known as: ALDACTONE Take 50 mg by mouth 2 (two) times daily.   triamcinolone 55 MCG/ACT nasal inhaler Commonly known as: NASACORT Place 2 sprays into the nose daily. 2 sprays in each nostril daily        Signature:  Coralyn Helling, MD [facility] Pager - [phone] 06/09/2022, 8:56 AM

## 2022-06-09 NOTE — Patient Instructions (Signed)
Follow up in 1 year.

## 2022-06-19 ENCOUNTER — Other Ambulatory Visit: Payer: Self-pay | Admitting: Cardiology

## 2022-06-20 DIAGNOSIS — Z8546 Personal history of malignant neoplasm of prostate: Secondary | ICD-10-CM | POA: Diagnosis not present

## 2022-06-27 DIAGNOSIS — Z8546 Personal history of malignant neoplasm of prostate: Secondary | ICD-10-CM | POA: Diagnosis not present

## 2022-06-27 DIAGNOSIS — R351 Nocturia: Secondary | ICD-10-CM | POA: Diagnosis not present

## 2022-06-27 DIAGNOSIS — G4733 Obstructive sleep apnea (adult) (pediatric): Secondary | ICD-10-CM | POA: Diagnosis not present

## 2022-06-27 DIAGNOSIS — N5201 Erectile dysfunction due to arterial insufficiency: Secondary | ICD-10-CM | POA: Diagnosis not present

## 2022-06-27 DIAGNOSIS — I10 Essential (primary) hypertension: Secondary | ICD-10-CM | POA: Diagnosis not present

## 2022-06-29 DIAGNOSIS — Z86010 Personal history of colonic polyps: Secondary | ICD-10-CM | POA: Diagnosis not present

## 2022-06-29 DIAGNOSIS — Z09 Encounter for follow-up examination after completed treatment for conditions other than malignant neoplasm: Secondary | ICD-10-CM | POA: Diagnosis not present

## 2022-06-29 DIAGNOSIS — K5730 Diverticulosis of large intestine without perforation or abscess without bleeding: Secondary | ICD-10-CM | POA: Diagnosis not present

## 2022-06-29 DIAGNOSIS — K648 Other hemorrhoids: Secondary | ICD-10-CM | POA: Diagnosis not present

## 2022-06-29 DIAGNOSIS — D125 Benign neoplasm of sigmoid colon: Secondary | ICD-10-CM | POA: Diagnosis not present

## 2022-07-05 DIAGNOSIS — D225 Melanocytic nevi of trunk: Secondary | ICD-10-CM | POA: Diagnosis not present

## 2022-07-05 DIAGNOSIS — X32XXXD Exposure to sunlight, subsequent encounter: Secondary | ICD-10-CM | POA: Diagnosis not present

## 2022-07-05 DIAGNOSIS — L570 Actinic keratosis: Secondary | ICD-10-CM | POA: Diagnosis not present

## 2022-07-05 DIAGNOSIS — C44319 Basal cell carcinoma of skin of other parts of face: Secondary | ICD-10-CM | POA: Diagnosis not present

## 2022-07-05 DIAGNOSIS — L821 Other seborrheic keratosis: Secondary | ICD-10-CM | POA: Diagnosis not present

## 2022-07-05 DIAGNOSIS — H61002 Unspecified perichondritis of left external ear: Secondary | ICD-10-CM | POA: Diagnosis not present

## 2022-07-05 DIAGNOSIS — L308 Other specified dermatitis: Secondary | ICD-10-CM | POA: Diagnosis not present

## 2022-07-07 DIAGNOSIS — D125 Benign neoplasm of sigmoid colon: Secondary | ICD-10-CM | POA: Diagnosis not present

## 2022-07-26 DIAGNOSIS — L308 Other specified dermatitis: Secondary | ICD-10-CM | POA: Diagnosis not present

## 2022-07-26 DIAGNOSIS — Z08 Encounter for follow-up examination after completed treatment for malignant neoplasm: Secondary | ICD-10-CM | POA: Diagnosis not present

## 2022-07-26 DIAGNOSIS — Z85828 Personal history of other malignant neoplasm of skin: Secondary | ICD-10-CM | POA: Diagnosis not present

## 2022-07-26 DIAGNOSIS — H61002 Unspecified perichondritis of left external ear: Secondary | ICD-10-CM | POA: Diagnosis not present

## 2022-07-26 DIAGNOSIS — X32XXXD Exposure to sunlight, subsequent encounter: Secondary | ICD-10-CM | POA: Diagnosis not present

## 2022-07-26 DIAGNOSIS — L570 Actinic keratosis: Secondary | ICD-10-CM | POA: Diagnosis not present

## 2022-07-27 DIAGNOSIS — G4733 Obstructive sleep apnea (adult) (pediatric): Secondary | ICD-10-CM | POA: Diagnosis not present

## 2022-07-27 DIAGNOSIS — I10 Essential (primary) hypertension: Secondary | ICD-10-CM | POA: Diagnosis not present

## 2022-08-09 DIAGNOSIS — I10 Essential (primary) hypertension: Secondary | ICD-10-CM | POA: Diagnosis not present

## 2022-08-09 DIAGNOSIS — R7303 Prediabetes: Secondary | ICD-10-CM | POA: Diagnosis not present

## 2022-08-09 DIAGNOSIS — I2510 Atherosclerotic heart disease of native coronary artery without angina pectoris: Secondary | ICD-10-CM | POA: Diagnosis not present

## 2022-08-27 DIAGNOSIS — I10 Essential (primary) hypertension: Secondary | ICD-10-CM | POA: Diagnosis not present

## 2022-08-27 DIAGNOSIS — G4733 Obstructive sleep apnea (adult) (pediatric): Secondary | ICD-10-CM | POA: Diagnosis not present

## 2022-09-14 DIAGNOSIS — I10 Essential (primary) hypertension: Secondary | ICD-10-CM | POA: Diagnosis not present

## 2022-09-14 DIAGNOSIS — G4733 Obstructive sleep apnea (adult) (pediatric): Secondary | ICD-10-CM | POA: Diagnosis not present

## 2022-10-06 NOTE — Progress Notes (Signed)
Cardiology Office Note   Date:  10/13/2022   ID:  John Brandt [birth year], MRN [id number]  PCP:  John Brunette, MD  Cardiologist:   John Myricks [place], MD   Chief Complaint  Patient presents with   Follow-up    1 year.      History of Present Illness: John Brandt is a 72 y.o. male who is seen at the request of John Brandt for evaluation of abnormal Ecg and eelevated coronary calcium score. He has a history of HTN and HLD. He has OSA. Was noted in 2021 to have a wide IVCD on Ecg. Echo at the time showed moderate LVH with normal EF. Mild AI and MR. Reports cardiac cath in the distant past in the early 90s in [place]. He had recent coronary calcium score done which was elevated at 1601. There was also an ascending aortic aneurysm measuring 4.5 cm.   Has prediabetes treated with metformin. Unable to tolerate Ozempic due to GI side effects.  His activity is limited by back problems. Walks with a cane. Does a lot of writing and sitting at his computer. He does have chronic LE edema L>>>R. States this has been better with regular use of compression hose. Had prior RT for prostate CA. Family history of MI and cerebral aneurysm. He denies any chest pain or dyspnea at his activity level. No palpitations.   PET CT in Sept 2023 was normal   Past Medical History:  Diagnosis Date   Allergic rhinitis    Chronic back pain    Complication of anesthesia    Diskitis 08/2011   t12 with osteomyelitis   Duodenitis    Esophagitis    Hx of cardiac cath    Hyperlipidemia    Hypertension    Paraspinal abscess (HCC) 08/2011   MSSA - treated 6 weeks IV abx   PONV (postoperative nausea and vomiting)    Pre-diabetes    Prostate cancer (HCC)    Sleep apnea    sleep study 07-02-02    Past Surgical History:  Procedure Laterality Date   BACK SURGERY     CARDIAC CATHETERIZATION     more than 15 yrs ago   dental implants     ESOPHAGOGASTRODUODENOSCOPY  10/28/2011   Procedure:  ESOPHAGOGASTRODUODENOSCOPY (EGD);  Surgeon: Hart Carwin, MD;  Location: [facility];  Service: Endoscopy;  Laterality: N/A;   Open Biopsy on Back  08/08/2011   PROSTATE BIOPSY     VASECTOMY       Current Outpatient Medications  Medication Sig Dispense Refill   amLODipine (NORVASC) 10 MG tablet Take 10 mg by mouth daily.     aspirin EC 81 MG tablet Take 81 mg by mouth daily.     atorvastatin (LIPITOR) 20 MG tablet Take 1 tablet (20 mg total) by mouth daily. 30 tablet 0   chlorthalidone (HYGROTON) 25 MG tablet Take 12.5 mg by mouth every morning.     cholecalciferol (VITAMIN D) 1000 UNITS tablet Take 1,000 Units by mouth daily.     cloNIDine (CATAPRES) 0.2 MG tablet Take 1 tablet (0.2 mg total) by mouth 3 (three) times daily. Hold unless BP > 140 when checked     denosumab (PROLIA) 60 MG/ML SOSY injection Inject 60 mg into the skin every 6 (six) months.     metFORMIN (GLUCOPHAGE) 500 MG tablet Take 1,000 mg by mouth 2 (two) times daily with a meal. With dinner     metoprolol succinate (TOPROL-XL) 50 MG  24 hr tablet Take 50 mg by mouth daily. Take with or immediately following a meal.     Multiple Vitamin (MULTIVITAMIN WITH MINERALS) TABS Take 1 tablet by mouth daily.     olmesartan (BENICAR) 40 MG tablet Take 40 mg by mouth daily.     potassium chloride SA (K-DUR,KLOR-CON) 20 MEQ tablet Take 40 mEq by mouth daily. Take 2 tabs in AM and 1 tab in afternoon.     spironolactone (ALDACTONE) 50 MG tablet Take 50 mg by mouth 2 (two) times daily.     tamsulosin (FLOMAX) 0.4 MG CAPS capsule Take 0.4 mg by mouth daily.     triamcinolone (NASACORT) 55 MCG/ACT nasal inhaler Place 2 sprays into the nose daily. 2 sprays in each nostril daily     vitamin C (ASCORBIC ACID) 500 MG tablet Take 500 mg by mouth daily.     No current facility-administered medications for this visit.    Allergies:   Fish-derived products, Cefazolin, Iodine, and Penicillins    Social History:  The patient  reports that he  has never smoked. He has never used smokeless tobacco. He reports current alcohol use. He reports that he does not use drugs.   Family History:  The patient's family history includes Allergies in his mother; Aneurysm in his brother; Breast cancer in his mother; Colon cancer in his maternal grandmother; Heart attack in his father; Heart disease in his father and mother; Hypertension in his brother, brother, and brother; Kidney cancer in his brother; Lung cancer in his father, mother, and paternal grandfather.    ROS:  Please see the history of present illness.   Otherwise, review of systems are positive for none.   All other systems are reviewed and negative.    PHYSICAL EXAM: VS:  BP 100/76 (BP Location: Left Arm, Patient Position: Sitting, Cuff Size: Large)   Pulse 70   Ht 6' (1.829 m)   Wt (!) 312 lb (141.5 kg)   BMI 42.31 kg/m  , BMI Body mass index is 42.31 kg/m. GEN: Well nourished, morbidly obese,  in no acute distress HEENT: normal Neck: no JVD, carotid bruits, or masses Cardiac: RRR; no murmurs, rubs, or gallops,no edema  Respiratory:  clear to auscultation bilaterally, normal work of breathing GI: soft, nontender, nondistended, + BS MS: no deformity or atrophy. 3+ LLE edema below knee. 1-2+ RLE edema. Compression hose in place.  Skin: warm and dry, no rash Neuro:  Strength and sensation are intact Psych: euthymic mood, full affect  EKG Interpretation Date/Time:  Thursday October 13 2022 08:16:46 EDT Ventricular Rate:  70 PR Interval:  162 QRS Duration:  130 QT Interval:  384 QTC Calculation: 414 R Axis:   -67  Text Interpretation: Left axis deviation Non-specific intra-ventricular conduction block Minimal voltage criteria for LVH, may be normal variant ( Cornell product ) Cannot rule out Anterior infarct , age undetermined When compared with ECG of 02-Jul-2011 06:54,  IVCD is now Present  Confirmed by Brandt, Ajiah Mcglinn [phone]) on 10/13/2022 8:54:39 AM              Recent Labs: No results found for requested labs within last 365 days.    Lipid Panel No results found for: "CHOL", "TRIG", "HDL", "CHOLHDL", "VLDL", "LDLCALC", "LDLDIRECT"  Dated 04/24/20: creatinine 1.37. eGFR 55. Otherwise CMET normal. Dated 03/20/21: A1c 5.2%.   Dated 05/03/21: cholesterol 164, triglycerides 215, HDL 42, non HDL 122. LDL 79. CBC normal. Creatinine 1.52. otherwise CMET normal.  Dated 05/05/22: A1c 5.7%.  cholesterol 145, triglycerides 174, HDL 42. LDL 68.   Wt Readings from Last 3 Encounters:  10/13/22 (!) 312 lb (141.5 kg)  06/09/22 (!) 308 lb (139.7 kg)  09/14/21 295 lb (133.8 kg)      Other studies Reviewed: Additional studies/ records that were reviewed today include:  Echo 05/28/19: Echocardiogram 05/28/2019:  Technically difficult study.  Left ventricle cavity is normal in size. Moderate concentric hypertrophy  of the left ventricle. Normal LV systolic function with EF 52%. Normal  global wall motion. Doppler evidence of grade I (impaired) diastolic  dysfunction, normal LAP.  Trileaflet aortic valve.  Mild (Grade I) aortic regurgitation.  Mild (Grade I) mitral regurgitation.  Inadequate TR jet to estimate pulmonary artery systolic pressure. Normal  right atrial pressure.  The aortic root is mildly dilated at 4.1 cm.  CLINICAL DATA:  Hypertension   EXAM: CT CARDIAC CORONARY ARTERY CALCIUM SCORE   TECHNIQUE: Non-contrast imaging through the heart was performed using prospective ECG gating. Image post processing was performed on an independent workstation, allowing for quantitative analysis of the heart and coronary arteries. Note that this exam targets the heart and the chest was not imaged in its entirety.   COMPARISON:  None Available.   FINDINGS: CORONARY CALCIUM SCORES:   Left Main: 19   LAD: 589   LCx: 93.2   RCA: 899   Total Agatston Score: 1601   MESA database percentile: 90   AORTA MEASUREMENTS:   Ascending Aorta: 45  mm   Descending Aorta: 30 mm   OTHER FINDINGS:   Heart is normal size. Aneurysmal dilatation of the ascending thoracic aorta measuring 4.5 cm. Scattered aortic calcifications. Calcified mediastinal and right hilar lymph nodes compatible with old granulomatous disease. Calcified granuloma in the inferior right upper lobe. No confluent opacities or effusions. No acute findings in the upper abdomen. Bilateral gynecomastia. No acute bony abnormality.   IMPRESSION: Total Agatston score: 1601   [place] database percentile: 90   4.5 cm ascending thoracic aortic aneurysm. Recommend semi-annual imaging followup by CTA or MRA and referral to cardiothoracic surgery if not already obtained. This recommendation follows 2010 ACCF/AHA/AATS/ACR/ASA/SCA/SCAI/SIR/STS/SVM Guidelines for the Diagnosis and Management of Patients With Thoracic Aortic Disease. Circulation. 2010; 121: W119-J478. Aortic aneurysm NOS (ICD10-I71.9)   Aortic atherosclerosis.     Electronically Signed   By: Charlett Nose M.D.   On: 06/01/2021 23:10     Cardiac PET CT 09/14/21:  Narrative & Impression      The study is normal. The study is low risk.   LV perfusion is normal.   Rest left ventricular function is normal. Rest EF: 54 %. Stress EF: 64 %. End diastolic cavity size is normal. End systolic cavity size is normal.   Myocardial blood flow was computed to be 0.66ml/g/min at rest and 2.49ml/g/min at stress. Global myocardial blood flow reserve was 2.35 and was normal.   Coronary calcium was present on the attenuation correction CT images. Mild coronary calcifications were present. Coronary calcifications were present in the left anterior descending artery, left circumflex artery and right coronary artery distribution(s).   CLINICAL DATA:  This over-read does not include interpretation of cardiac or coronary anatomy or pathology. The Cardiac PET CT interpretation by the cardiologist is attached.   COMPARISON:  PET-CT  04/27/2020 and coronary calcium score 06/01/2021     ASSESSMENT AND PLAN:  1.  High coronary calcium score. 1601. Patient without significant symptoms but activity is very sedentary.  Stress PET CT in Sept 2023  was normal. Continue risk factor modification. 2. Thoracic aortic aneurysm 4.5 cm. Noted incidentally on CT. Recommend  annual follow up with contrast CT. Will need pretreatment for die allergy. Check BMET.  3. HTN. Well controlled. 4. HLD mixed. Goal LDL <55 given high calcium score. Last LDL 68 a little higher than before. Encourage weight loss.  5. Pre diabetes. Per PCP. Encourage efforts at weight loss. Last A1c 5.7%.  6. CKD stage 3a 7. Chronic LE edema. L>>R 8. Morbid obesity with OSA on CPAP. 9. Contrast allergy.   Current medicines are reviewed at length with the patient today.  The patient does not have concerns regarding medicines.  The following changes have been made:  increase lipitor to 20 mg daily.   Labs/ tests ordered today include:   Orders Placed This Encounter  Procedures   CT ANGIO CHEST AORTA W/CM & OR WO/CM   EKG 12-Lead         Disposition:   FU with me after stress test  Signed, Ryliegh Mcduffey [place], MD  10/13/2022 8:53 AM    [facility] [address], [place], [place], [address] Phone [phone], Fax [phone]

## 2022-10-12 DIAGNOSIS — H5203 Hypermetropia, bilateral: Secondary | ICD-10-CM | POA: Diagnosis not present

## 2022-10-13 ENCOUNTER — Ambulatory Visit: Payer: Medicare Other | Attending: Cardiology | Admitting: Cardiology

## 2022-10-13 ENCOUNTER — Encounter: Payer: Self-pay | Admitting: Cardiology

## 2022-10-13 VITALS — BP 100/76 | HR 70 | Ht 72.0 in | Wt 312.0 lb

## 2022-10-13 DIAGNOSIS — G4733 Obstructive sleep apnea (adult) (pediatric): Secondary | ICD-10-CM | POA: Diagnosis not present

## 2022-10-13 DIAGNOSIS — I2510 Atherosclerotic heart disease of native coronary artery without angina pectoris: Secondary | ICD-10-CM

## 2022-10-13 DIAGNOSIS — I10 Essential (primary) hypertension: Secondary | ICD-10-CM | POA: Diagnosis not present

## 2022-10-13 DIAGNOSIS — I7121 Aneurysm of the ascending aorta, without rupture: Secondary | ICD-10-CM | POA: Diagnosis not present

## 2022-10-13 LAB — BASIC METABOLIC PANEL
BUN/Creatinine Ratio: 17 (ref 10–24)
BUN: 21 mg/dL (ref 8–27)
CO2: 24 mmol/L (ref 20–29)
Calcium: 11.3 mg/dL — ABNORMAL HIGH (ref 8.6–10.2)
Chloride: 100 mmol/L (ref 96–106)
Creatinine, Ser: 1.26 mg/dL (ref 0.76–1.27)
Glucose: 123 mg/dL — ABNORMAL HIGH (ref 70–99)
Potassium: 4.3 mmol/L (ref 3.5–5.2)
Sodium: 138 mmol/L (ref 134–144)
eGFR: 60 mL/min/{1.73_m2} (ref >59)

## 2022-10-13 MED ORDER — PREDNISONE 50 MG PO TABS: ORAL_TABLET | ORAL | 0 refills | Status: AC

## 2022-10-13 MED ORDER — DIPHENHYDRAMINE HCL 50 MG PO TABS: ORAL_TABLET | ORAL | 0 refills | Status: DC

## 2022-10-13 NOTE — Patient Instructions (Signed)
Medication Instructions:  Continue All Medications *If you need a refill on your cardiac medications before your next appointment, please call your pharmacy*   Lab Work: Bmet today    Testing/Procedures  Schedule Chest CT  Take Prednisone 50 mg 13 hours before chest ct then take 50 mg 7 hours before then take 50 mg along with Benadryl 50 mg 2 hours before chest ct   Follow-Up: At [facility], you and your health needs are our priority.  As part of our continuing mission to provide you with exceptional heart care, we have created designated Provider Care Teams.  These Care Teams include your primary Cardiologist (physician) and Advanced Practice Providers (APPs -  Physician Assistants and Nurse Practitioners) who all work together to provide you with the care you need, when you need it.  We recommend signing up for the patient portal called "MyChart".  Sign up information is provided on this After Visit Summary.  MyChart is used to connect with patients for Virtual Visits (Telemedicine).  Patients are able to view lab/test results, encounter notes, upcoming appointments, etc.  Non-urgent messages can be sent to your provider as well.   To learn more about what you can do with MyChart, go to [email or web address].    Your next appointment:  1 year      Call in June to schedule Oct appointment    Provider:  Dr.Jordan

## 2022-10-14 DIAGNOSIS — G4733 Obstructive sleep apnea (adult) (pediatric): Secondary | ICD-10-CM | POA: Diagnosis not present

## 2022-10-14 DIAGNOSIS — I10 Essential (primary) hypertension: Secondary | ICD-10-CM | POA: Diagnosis not present

## 2022-10-20 ENCOUNTER — Ambulatory Visit (HOSPITAL_COMMUNITY)
Admission: RE | Admit: 2022-10-20 | Discharge: 2022-10-20 | Disposition: A | Discharge status: Home / Self Care | Payer: Medicare Other | Source: Ambulatory Visit | Attending: Cardiology | Admitting: Cardiology

## 2022-10-20 DIAGNOSIS — G4733 Obstructive sleep apnea (adult) (pediatric): Secondary | ICD-10-CM | POA: Insufficient documentation

## 2022-10-20 DIAGNOSIS — N281 Cyst of kidney, acquired: Secondary | ICD-10-CM | POA: Diagnosis not present

## 2022-10-20 DIAGNOSIS — I10 Essential (primary) hypertension: Secondary | ICD-10-CM | POA: Diagnosis not present

## 2022-10-20 DIAGNOSIS — I7121 Aneurysm of the ascending aorta, without rupture: Secondary | ICD-10-CM | POA: Diagnosis not present

## 2022-10-20 DIAGNOSIS — K8020 Calculus of gallbladder without cholecystitis without obstruction: Secondary | ICD-10-CM | POA: Diagnosis not present

## 2022-10-20 DIAGNOSIS — I2510 Atherosclerotic heart disease of native coronary artery without angina pectoris: Secondary | ICD-10-CM | POA: Diagnosis not present

## 2022-10-20 MED ORDER — IOHEXOL 350 MG/ML SOLN
75.0000 mL | Freq: Once | INTRAVENOUS | Status: AC | PRN
  Administered 2022-10-20: 75 mL via INTRAVENOUS

## 2022-11-14 DIAGNOSIS — G4733 Obstructive sleep apnea (adult) (pediatric): Secondary | ICD-10-CM | POA: Diagnosis not present

## 2022-11-14 DIAGNOSIS — I10 Essential (primary) hypertension: Secondary | ICD-10-CM | POA: Diagnosis not present

## 2022-12-05 DIAGNOSIS — M810 Age-related osteoporosis without current pathological fracture: Secondary | ICD-10-CM | POA: Diagnosis not present

## 2022-12-08 DIAGNOSIS — E7800 Pure hypercholesterolemia, unspecified: Secondary | ICD-10-CM | POA: Diagnosis not present

## 2022-12-08 DIAGNOSIS — I2510 Atherosclerotic heart disease of native coronary artery without angina pectoris: Secondary | ICD-10-CM | POA: Diagnosis not present

## 2022-12-08 DIAGNOSIS — I10 Essential (primary) hypertension: Secondary | ICD-10-CM | POA: Diagnosis not present

## 2022-12-08 DIAGNOSIS — Z6841 Body Mass Index (BMI) 40.0 and over, adult: Secondary | ICD-10-CM | POA: Diagnosis not present

## 2022-12-08 DIAGNOSIS — R7303 Prediabetes: Secondary | ICD-10-CM | POA: Diagnosis not present

## 2022-12-14 DIAGNOSIS — I10 Essential (primary) hypertension: Secondary | ICD-10-CM | POA: Diagnosis not present

## 2022-12-14 DIAGNOSIS — G4733 Obstructive sleep apnea (adult) (pediatric): Secondary | ICD-10-CM | POA: Diagnosis not present

## 2022-12-19 DIAGNOSIS — Z8546 Personal history of malignant neoplasm of prostate: Secondary | ICD-10-CM | POA: Diagnosis not present

## 2022-12-26 DIAGNOSIS — N5201 Erectile dysfunction due to arterial insufficiency: Secondary | ICD-10-CM | POA: Diagnosis not present

## 2022-12-26 DIAGNOSIS — Z8546 Personal history of malignant neoplasm of prostate: Secondary | ICD-10-CM | POA: Diagnosis not present

## 2022-12-26 DIAGNOSIS — R351 Nocturia: Secondary | ICD-10-CM | POA: Diagnosis not present

## 2023-03-15 DIAGNOSIS — G4733 Obstructive sleep apnea (adult) (pediatric): Secondary | ICD-10-CM | POA: Diagnosis not present

## 2023-03-15 DIAGNOSIS — I10 Essential (primary) hypertension: Secondary | ICD-10-CM | POA: Diagnosis not present

## 2023-04-15 DIAGNOSIS — G4733 Obstructive sleep apnea (adult) (pediatric): Secondary | ICD-10-CM | POA: Diagnosis not present

## 2023-04-15 DIAGNOSIS — I10 Essential (primary) hypertension: Secondary | ICD-10-CM | POA: Diagnosis not present

## 2023-05-10 DIAGNOSIS — Z125 Encounter for screening for malignant neoplasm of prostate: Secondary | ICD-10-CM | POA: Diagnosis not present

## 2023-05-10 DIAGNOSIS — I10 Essential (primary) hypertension: Secondary | ICD-10-CM | POA: Diagnosis not present

## 2023-05-10 DIAGNOSIS — R7303 Prediabetes: Secondary | ICD-10-CM | POA: Diagnosis not present

## 2023-05-10 LAB — LAB REPORT - SCANNED
A1c: 5.8
EGFR (Non-African Amer.): 54

## 2023-05-15 DIAGNOSIS — M810 Age-related osteoporosis without current pathological fracture: Secondary | ICD-10-CM | POA: Diagnosis not present

## 2023-05-15 DIAGNOSIS — I700 Atherosclerosis of aorta: Secondary | ICD-10-CM | POA: Diagnosis not present

## 2023-05-15 DIAGNOSIS — I10 Essential (primary) hypertension: Secondary | ICD-10-CM | POA: Diagnosis not present

## 2023-05-15 DIAGNOSIS — G4733 Obstructive sleep apnea (adult) (pediatric): Secondary | ICD-10-CM | POA: Diagnosis not present

## 2023-05-15 DIAGNOSIS — Z0000 Encounter for general adult medical examination without abnormal findings: Secondary | ICD-10-CM | POA: Diagnosis not present

## 2023-05-15 DIAGNOSIS — N281 Cyst of kidney, acquired: Secondary | ICD-10-CM | POA: Diagnosis not present

## 2023-05-16 ENCOUNTER — Other Ambulatory Visit: Payer: Self-pay | Admitting: Internal Medicine

## 2023-05-16 DIAGNOSIS — N281 Cyst of kidney, acquired: Secondary | ICD-10-CM

## 2023-05-19 ENCOUNTER — Ambulatory Visit
Admission: RE | Admit: 2023-05-19 | Discharge: 2023-05-19 | Disposition: A | Discharge status: Home / Self Care | Source: Ambulatory Visit | Attending: Internal Medicine | Admitting: Internal Medicine

## 2023-05-19 DIAGNOSIS — N281 Cyst of kidney, acquired: Secondary | ICD-10-CM | POA: Diagnosis not present

## 2023-06-06 DIAGNOSIS — M810 Age-related osteoporosis without current pathological fracture: Secondary | ICD-10-CM | POA: Diagnosis not present

## 2023-06-13 DIAGNOSIS — Z8546 Personal history of malignant neoplasm of prostate: Secondary | ICD-10-CM | POA: Diagnosis not present

## 2023-06-20 DIAGNOSIS — R351 Nocturia: Secondary | ICD-10-CM | POA: Diagnosis not present

## 2023-06-20 DIAGNOSIS — Z8546 Personal history of malignant neoplasm of prostate: Secondary | ICD-10-CM | POA: Diagnosis not present

## 2023-08-02 DIAGNOSIS — I10 Essential (primary) hypertension: Secondary | ICD-10-CM | POA: Diagnosis not present

## 2023-08-02 DIAGNOSIS — G4733 Obstructive sleep apnea (adult) (pediatric): Secondary | ICD-10-CM | POA: Diagnosis not present

## 2023-09-02 DIAGNOSIS — G4733 Obstructive sleep apnea (adult) (pediatric): Secondary | ICD-10-CM | POA: Diagnosis not present

## 2023-09-14 DIAGNOSIS — G4733 Obstructive sleep apnea (adult) (pediatric): Secondary | ICD-10-CM | POA: Diagnosis not present

## 2023-09-14 DIAGNOSIS — I10 Essential (primary) hypertension: Secondary | ICD-10-CM | POA: Diagnosis not present

## 2023-09-14 DIAGNOSIS — I700 Atherosclerosis of aorta: Secondary | ICD-10-CM | POA: Diagnosis not present

## 2023-10-03 DIAGNOSIS — I10 Essential (primary) hypertension: Secondary | ICD-10-CM | POA: Diagnosis not present

## 2023-10-12 DIAGNOSIS — H52223 Regular astigmatism, bilateral: Secondary | ICD-10-CM | POA: Diagnosis not present

## 2023-11-01 DIAGNOSIS — I10 Essential (primary) hypertension: Secondary | ICD-10-CM | POA: Diagnosis not present

## 2023-11-02 DIAGNOSIS — I10 Essential (primary) hypertension: Secondary | ICD-10-CM | POA: Diagnosis not present

## 2023-11-24 DIAGNOSIS — H00024 Hordeolum internum left upper eyelid: Secondary | ICD-10-CM | POA: Diagnosis not present

## 2023-12-04 DIAGNOSIS — Z8546 Personal history of malignant neoplasm of prostate: Secondary | ICD-10-CM | POA: Diagnosis not present

## 2023-12-11 DIAGNOSIS — R3912 Poor urinary stream: Secondary | ICD-10-CM | POA: Diagnosis not present

## 2023-12-14 DIAGNOSIS — G4733 Obstructive sleep apnea (adult) (pediatric): Secondary | ICD-10-CM | POA: Diagnosis not present

## 2023-12-14 DIAGNOSIS — Z6841 Body Mass Index (BMI) 40.0 and over, adult: Secondary | ICD-10-CM | POA: Diagnosis not present

## 2024-01-12 NOTE — Progress Notes (Unsigned)
 "    Cardiology Office Note   Date:  01/12/2024   ID:  Barre, Aydelott 1950/06/25, MRN [id number]  PCP:  Clarice Nottingham, MD  Cardiologist:   Cyara Devoto, MD   No chief complaint on file.     History of Present Illness: John Brandt is a 74 y.o. male who is seen for follow up CAD and thoracic aortic aneurysm.  He has a history of HTN and HLD. He has OSA. Was noted in 2021 to have a wide IVCD on Ecg. Echo at the time showed moderate LVH with normal EF. Mild AI and MR. Reports cardiac cath in the distant past in the early 90s in [place] . He had coronary calcium  score done which was elevated at 1601. There was also an ascending aortic aneurysm measuring 4.5 cm. Subsequent PET CT showed no ischemia or infarction.   He notes he does use elliptical regularly. Now on Zepbound. Has lost 6 lbs. Has chronic back problems. Does a lot of writing and sitting at computer.    He does have chronic LE edema L>>>R. States this has been better with regular use of compression hose. Had prior RT for prostate CA. Family history of MI and cerebral aneurysm. He denies any chest pain or dyspnea at his activity level. No palpitations.     Past Medical History:  Diagnosis Date   Allergic rhinitis    Chronic back pain    Complication of anesthesia    Diskitis 08/2011   t12 with osteomyelitis   Duodenitis    Esophagitis    Hx of cardiac cath    Hyperlipidemia    Hypertension    Paraspinal abscess (HCC) 08/2011   MSSA - treated 6 weeks IV abx   PONV (postoperative nausea and vomiting)    Pre-diabetes    Prostate cancer (HCC)    Sleep apnea    sleep study 07-02-02    Past Surgical History:  Procedure Laterality Date   BACK SURGERY     CARDIAC CATHETERIZATION     more than 15 yrs ago   dental implants     ESOPHAGOGASTRODUODENOSCOPY  10/28/2011   Procedure: ESOPHAGOGASTRODUODENOSCOPY (EGD);  Surgeon: Princella CHRISTELLA Nida, MD;  Location: [facility];  Service: Endoscopy;  Laterality: N/A;   Open  Biopsy on Back  08/08/2011   PROSTATE BIOPSY     VASECTOMY       Current Outpatient Medications  Medication Sig Dispense Refill   amLODipine  (NORVASC ) 10 MG tablet Take 10 mg by mouth daily.     aspirin  EC 81 MG tablet Take 81 mg by mouth daily.     atorvastatin  (LIPITOR) 20 MG tablet Take 1 tablet (20 mg total) by mouth daily. 30 tablet 0   chlorthalidone (HYGROTON) 25 MG tablet Take 12.5 mg by mouth every morning.     cholecalciferol (VITAMIN D) 1000 UNITS tablet Take 1,000 Units by mouth daily.     cloNIDine  (CATAPRES ) 0.2 MG tablet Take 1 tablet (0.2 mg total) by mouth 3 (three) times daily. Hold unless BP > 140 when checked     denosumab  (PROLIA ) 60 MG/ML SOSY injection Inject 60 mg into the skin every 6 (six) months.     diphenhydrAMINE  (BENADRYL ) 50 MG tablet Take 50 mg 2 hours before ct scan 1 tablet 0   metFORMIN (GLUCOPHAGE) 500 MG tablet Take 1,000 mg by mouth 2 (two) times daily with a meal. With dinner     metoprolol  succinate (TOPROL -XL) 50 MG 24 hr tablet  Take 50 mg by mouth daily. Take with or immediately following a meal.     Multiple Vitamin (MULTIVITAMIN WITH MINERALS) TABS Take 1 tablet by mouth daily.     olmesartan (BENICAR) 40 MG tablet Take 40 mg by mouth daily.     potassium chloride  SA (K-DUR,KLOR-CON ) 20 MEQ tablet Take 40 mEq by mouth daily. Take 2 tabs in AM and 1 tab in afternoon.     predniSONE  (DELTASONE ) 50 MG tablet Take 50 mg 13 hours before ct scan then take 50 mg 7 hours before ct scan then take 50 mg 2 hours before ct scan 3 tablet 0   spironolactone  (ALDACTONE ) 50 MG tablet Take 50 mg by mouth 2 (two) times daily.     tamsulosin  (FLOMAX ) 0.4 MG CAPS capsule Take 0.4 mg by mouth daily.     triamcinolone (NASACORT) 55 MCG/ACT nasal inhaler Place 2 sprays into the nose daily. 2 sprays in each nostril daily     vitamin C (ASCORBIC ACID) 500 MG tablet Take 500 mg by mouth daily.     No current facility-administered medications for this visit.     Allergies:   Fish protein-containing drug products, Cefazolin, Iodine, and Penicillins    Social History:  The patient  reports that he has never smoked. He has never used smokeless tobacco. He reports current alcohol use. He reports that he does not use drugs.   Family History:  The patient's family history includes Allergies in his mother; Aneurysm in his brother; Breast cancer in his mother; Colon cancer in his maternal grandmother; Heart attack in his father; Heart disease in his father and mother; Hypertension in his brother, brother, and brother; Kidney cancer in his brother; Lung cancer in his father, mother, and paternal grandfather.    ROS:  Please see the history of present illness.   Otherwise, review of systems are positive for none.   All other systems are reviewed and negative.    PHYSICAL EXAM: VS:  There were no vitals taken for this visit. , BMI There is no height or weight on file to calculate BMI. GEN: Well nourished, morbidly obese,  in no acute distress HEENT: normal Neck: no JVD, carotid bruits, or masses Cardiac: RRR; no murmurs, rubs, or gallops,no edema  Respiratory:  clear to auscultation bilaterally, normal work of breathing GI: soft, nontender, nondistended, + BS MS: no deformity or atrophy. 3+ LLE edema below knee. 1-2+ RLE edema. Compression hose in place.  Skin: warm and dry, no rash Neuro:  Strength and sensation are intact Psych: euthymic mood, full affect            EKG Interpretation Date/Time:  Wednesday January 17 2024 08:25:33 EST Ventricular Rate:  70 PR Interval:  168 QRS Duration:  122 QT Interval:  346 QTC Calculation: 373 R Axis:   268  Text Interpretation: Normal sinus rhythm Possible Anterolateral infarct (cited on or before 17-Jan-2024) When compared with ECG of 13-Oct-2022 08:16, Nonspecific T wave abnormality now evident in Inferior leads QRS duration is less Confirmed by Madison Albea [phone]) on 01/17/2024 8:59:31 AM       Recent Labs: No results found for requested labs within last 365 days.    Lipid Panel No results found for: CHOL, TRIG, HDL, CHOLHDL, VLDL, LDLCALC, LDLDIRECT  Dated 04/24/20: creatinine 1.37. eGFR 55. Otherwise CMET normal. Dated 03/20/21: A1c 5.2%.   Dated 05/03/21: cholesterol 164, triglycerides 215, HDL 42, non HDL 122. LDL 79. CBC normal. Creatinine 1.52. otherwise CMET normal.  Dated 05/05/22: A1c 5.7%. cholesterol 145, triglycerides 174, HDL 42. LDL 68.  Dated 05/10/23: cholesterol 144, triglycerides 205, HDL 44, LDL 66. A1c 5.7%.   Wt Readings from Last 3 Encounters:  10/13/22 (!) 312 lb (141.5 kg)  06/09/22 (!) 308 lb (139.7 kg)  09/14/21 295 lb (133.8 kg)      Other studies Reviewed: Additional studies/ records that were reviewed today include:  Echo 05/28/19: Echocardiogram 05/28/2019:  Technically difficult study.  Left ventricle cavity is normal in size. Moderate concentric hypertrophy  of the left ventricle. Normal LV systolic function with EF 52%. Normal  global wall motion. Doppler evidence of grade I (impaired) diastolic  dysfunction, normal LAP.  Trileaflet aortic valve.  Mild (Grade I) aortic regurgitation.  Mild (Grade I) mitral regurgitation.  Inadequate TR jet to estimate pulmonary artery systolic pressure. Normal  right atrial pressure.  The aortic root is mildly dilated at 4.1 cm.  CLINICAL DATA:  Hypertension   EXAM: CT CARDIAC CORONARY ARTERY CALCIUM  SCORE   TECHNIQUE: Non-contrast imaging through the heart was performed using prospective ECG gating. Image post processing was performed on an independent workstation, allowing for quantitative analysis of the heart and coronary arteries. Note that this exam targets the heart and the chest was not imaged in its entirety.   COMPARISON:  None Available.   FINDINGS: CORONARY CALCIUM  SCORES:   Left Main: 19   LAD: 589   LCx: 93.2   RCA: 899   Total Agatston Score: 1601    MESA database percentile: 90   AORTA MEASUREMENTS:   Ascending Aorta: 45 mm   Descending Aorta: 30 mm   OTHER FINDINGS:   Heart is normal size. Aneurysmal dilatation of the ascending thoracic aorta measuring 4.5 cm. Scattered aortic calcifications. Calcified mediastinal and right hilar lymph nodes compatible with old granulomatous disease. Calcified granuloma in the inferior right upper lobe. No confluent opacities or effusions. No acute findings in the upper abdomen. Bilateral gynecomastia. No acute bony abnormality.   IMPRESSION: Total Agatston score: 1601   [place] database percentile: 90   4.5 cm ascending thoracic aortic aneurysm. Recommend semi-annual imaging followup by CTA or MRA and referral to cardiothoracic surgery if not already obtained. This recommendation follows 2010 ACCF/AHA/AATS/ACR/ASA/SCA/SCAI/SIR/STS/SVM Guidelines for the Diagnosis and Management of Patients With Thoracic Aortic Disease. Circulation. 2010; 121: Z733-z630. Aortic aneurysm NOS (ICD10-I71.9)   Aortic atherosclerosis.     Electronically Signed   By: Franky Crease M.D.   On: 06/01/2021 23:10     Cardiac PET CT 09/14/21:  Narrative & Impression      The study is normal. The study is low risk.   LV perfusion is normal.   Rest left ventricular function is normal. Rest EF: 54 %. Stress EF: 64 %. End diastolic cavity size is normal. End systolic cavity size is normal.   Myocardial blood flow was computed to be 0.93ml/g/min at rest and 2.87ml/g/min at stress. Global myocardial blood flow reserve was 2.35 and was normal.   Coronary calcium  was present on the attenuation correction CT images. Mild coronary calcifications were present. Coronary calcifications were present in the left anterior descending artery, left circumflex artery and right coronary artery distribution(s).   CLINICAL DATA:  This over-read does not include interpretation of cardiac or coronary anatomy or pathology. The Cardiac  PET CT interpretation by the cardiologist is attached.   COMPARISON:  PET-CT 04/27/2020 and coronary calcium  score 06/01/2021     ASSESSMENT AND PLAN:  1.  High coronary calcium  score.  1601. Patient without significant symptoms but activity is very sedentary.  Stress PET CT in Sept 2023 was normal. Continue risk factor modification. 2. Thoracic aortic aneurysm 4.5 cm. Noted incidentally on CT. Recommend  annual follow up with contrast CT. Will need pretreatment for die allergy. Check BMET.  3. HTN. Well controlled. 4. HLD mixed. Goal LDL <55 given high calcium  score. Last LDL 66. Recommend increase in lipitor to 40 mg daily. Will have repeat lab with Dr Clarice- if still not at goal consider adding Zetia or repatha 5. Pre diabetes. Per PCP. Encourage efforts at weight loss. Last A1c 5.7%. on Zepbound 6. CKD stage 3a 7. Chronic LE edema. L>>R 8. Morbid obesity with OSA on CPAP. 9. Contrast allergy.   Current medicines are reviewed at length with the patient today.  The patient does not have concerns regarding medicines.  The following changes have been made:  increase lipitor to 40 mg daily.   Labs/ tests ordered today include:   No orders of the defined types were placed in this encounter.        Disposition:   FU with me one year  Signed, Epifanio Labrador, MD  01/12/2024 9:56 AM    [facility] [address] [place], [place], [address] Phone [phone], Fax [phone]   "

## 2024-01-17 ENCOUNTER — Encounter: Payer: Self-pay | Admitting: Cardiology

## 2024-01-17 ENCOUNTER — Other Ambulatory Visit (HOSPITAL_COMMUNITY): Payer: Self-pay

## 2024-01-17 ENCOUNTER — Ambulatory Visit: Attending: Cardiology | Admitting: Cardiology

## 2024-01-17 VITALS — BP 128/74 | HR 70 | Ht 71.0 in | Wt 306.2 lb

## 2024-01-17 DIAGNOSIS — I7121 Aneurysm of the ascending aorta, without rupture: Secondary | ICD-10-CM

## 2024-01-17 DIAGNOSIS — I10 Essential (primary) hypertension: Secondary | ICD-10-CM | POA: Diagnosis not present

## 2024-01-17 DIAGNOSIS — I2510 Atherosclerotic heart disease of native coronary artery without angina pectoris: Secondary | ICD-10-CM

## 2024-01-17 MED ORDER — ATORVASTATIN CALCIUM 40 MG PO TABS: 40.0000 mg | ORAL_TABLET | Freq: Every day | ORAL | 3 refills | Status: AC

## 2024-01-17 MED ORDER — DIPHENHYDRAMINE HCL 25 MG PO TABS
50.0000 mg | ORAL_TABLET | ORAL | 0 refills | Status: AC
  Filled 2024-01-17: qty 2, 1d supply, fill #0

## 2024-01-17 MED ORDER — PREDNISONE 50 MG PO TABS
50.0000 mg | ORAL_TABLET | Freq: Three times a day (TID) | ORAL | 0 refills | Status: AC
  Filled 2024-01-17: qty 3, 1d supply, fill #0

## 2024-01-17 NOTE — Patient Instructions (Addendum)
 Medication Instructions:  Increase Lipitor to 40 mg daily Continue all other medications *If you need a refill on your cardiac medications before your next appointment, please call your pharmacy*  Lab Work: Bmet today   Testing/Procedures: Chest CT   will be scheduled at [facility]   Take Prednisone  and Benadryl  as prescribed before ct  Follow-Up: At [facility], you and your health needs are our priority.  As part of our continuing mission to provide you with exceptional heart care, our providers are all part of one team.  This team includes your primary Cardiologist (physician) and Advanced Practice Providers or APPs (Physician Assistants and Nurse Practitioners) who all work together to provide you with the care you need, when you need it.  Your next appointment:  1 year    Call in Oct to schedule Jan appointment     Provider:  Dr.Jordan   We recommend signing up for the patient portal called MyChart.  Sign up information is provided on this After Visit Summary.  MyChart is used to connect with patients for Virtual Visits (Telemedicine).  Patients are able to view lab/test results, encounter notes, upcoming appointments, etc.  Non-urgent messages can be sent to your provider as well.   To learn more about what you can do with MyChart, go to [email or web address].

## 2024-01-18 ENCOUNTER — Ambulatory Visit: Payer: Self-pay | Admitting: Cardiology

## 2024-01-18 LAB — BASIC METABOLIC PANEL WITH GFR
BUN/Creatinine Ratio: 13 (ref 10–24)
BUN: 22 mg/dL (ref 8–27)
CO2: 20 mmol/L (ref 20–29)
Calcium: 10.9 mg/dL — ABNORMAL HIGH (ref 8.6–10.2)
Chloride: 101 mmol/L (ref 96–106)
Creatinine, Ser: 1.74 mg/dL — ABNORMAL HIGH (ref 0.76–1.27)
Glucose: 104 mg/dL — ABNORMAL HIGH (ref 70–99)
Potassium: 4.8 mmol/L (ref 3.5–5.2)
Sodium: 138 mmol/L (ref 134–144)
eGFR: 40 mL/min/1.73 — ABNORMAL LOW

## 2024-01-18 NOTE — Telephone Encounter (Signed)
"   Spoke to patient is aware to hold current medication- Chlorthalidone, aldactone     Hydrate and not to take  advil , motrin or Aleve .    He is also aware we are cancelling  Ct on 01/22/24    Cancel  in Epic the  CT appointment   Did not remove medication from current list  "

## 2024-01-18 NOTE — Telephone Encounter (Signed)
-----   Message from Peter Jordan, MD sent at 01/18/2024  7:09 AM EST ----- The following abnormalities are noted:  renal function is significantly worse than prior.  All other values are normal, stable or within acceptable limits. Medication changes / Follow up labs / Other changes or recommendations:   Hold aldactone  and HCT. Hydrate. Avoid any NSAIDs. Let's repeat BMET in a week. Would hold off on CT until renal function better.  Peter Jordan, MD 01/18/2024 7:05 AM

## 2024-01-19 ENCOUNTER — Other Ambulatory Visit: Payer: Self-pay

## 2024-01-19 DIAGNOSIS — I10 Essential (primary) hypertension: Secondary | ICD-10-CM

## 2024-01-22 ENCOUNTER — Ambulatory Visit (HOSPITAL_COMMUNITY)

## 2024-01-25 LAB — BASIC METABOLIC PANEL WITH GFR
BUN/Creatinine Ratio: 13 (ref 10–24)
BUN: 16 mg/dL (ref 8–27)
CO2: 20 mmol/L (ref 20–29)
Calcium: 9.6 mg/dL (ref 8.6–10.2)
Chloride: 102 mmol/L (ref 96–106)
Creatinine, Ser: 1.28 mg/dL — ABNORMAL HIGH (ref 0.76–1.27)
Glucose: 108 mg/dL — ABNORMAL HIGH (ref 70–99)
Potassium: 3.8 mmol/L (ref 3.5–5.2)
Sodium: 140 mmol/L (ref 134–144)
eGFR: 58 mL/min/1.73 — ABNORMAL LOW

## 2024-01-26 ENCOUNTER — Ambulatory Visit: Payer: Self-pay | Admitting: Cardiology

## 2024-01-26 ENCOUNTER — Other Ambulatory Visit: Payer: Self-pay

## 2024-01-26 DIAGNOSIS — I7121 Aneurysm of the ascending aorta, without rupture: Secondary | ICD-10-CM

## 2024-02-09 ENCOUNTER — Ambulatory Visit (HOSPITAL_COMMUNITY): Admission: RE | Admit: 2024-02-09 | Source: Ambulatory Visit

## 2024-02-09 DIAGNOSIS — I7121 Aneurysm of the ascending aorta, without rupture: Secondary | ICD-10-CM

## 2024-02-09 MED ORDER — IOHEXOL 350 MG/ML SOLN
75.0000 mL | Freq: Once | INTRAVENOUS | Status: AC | PRN
  Administered 2024-02-09: 75 mL via INTRAVENOUS
